# Patient Record
Sex: Male | Born: 1935 | Race: White | Hispanic: No | Marital: Married
Health system: Midwestern US, Community
[De-identification: ages and names within clinical notes are randomized; demographics above are authoritative.]

## PROBLEM LIST (undated history)

## (undated) ENCOUNTER — Emergency Department (HOSPITAL_COMMUNITY): Payer: Medicare Other

## (undated) DIAGNOSIS — E785 Hyperlipidemia, unspecified: Secondary | ICD-10-CM

## (undated) DIAGNOSIS — R269 Unspecified abnormalities of gait and mobility: Secondary | ICD-10-CM

## (undated) DIAGNOSIS — M810 Age-related osteoporosis without current pathological fracture: Secondary | ICD-10-CM

## (undated) DIAGNOSIS — I259 Chronic ischemic heart disease, unspecified: Secondary | ICD-10-CM

## (undated) DIAGNOSIS — E538 Deficiency of other specified B group vitamins: Secondary | ICD-10-CM

## (undated) DIAGNOSIS — D649 Anemia, unspecified: Secondary | ICD-10-CM

## (undated) DIAGNOSIS — Z7901 Long term (current) use of anticoagulants: Secondary | ICD-10-CM

## (undated) DIAGNOSIS — N400 Enlarged prostate without lower urinary tract symptoms: Secondary | ICD-10-CM

## (undated) DIAGNOSIS — W19XXXA Unspecified fall, initial encounter: Secondary | ICD-10-CM

## (undated) DIAGNOSIS — R001 Bradycardia, unspecified: Secondary | ICD-10-CM

## (undated) DIAGNOSIS — E559 Vitamin D deficiency, unspecified: Secondary | ICD-10-CM

## (undated) DIAGNOSIS — I519 Heart disease, unspecified: Secondary | ICD-10-CM

## (undated) DIAGNOSIS — I10 Essential (primary) hypertension: Secondary | ICD-10-CM

## (undated) DIAGNOSIS — K219 Gastro-esophageal reflux disease without esophagitis: Secondary | ICD-10-CM

## (undated) DIAGNOSIS — R55 Syncope and collapse: Secondary | ICD-10-CM

## (undated) DIAGNOSIS — I509 Heart failure, unspecified: Secondary | ICD-10-CM

## (undated) DIAGNOSIS — I252 Old myocardial infarction: Secondary | ICD-10-CM

## (undated) DIAGNOSIS — Z951 Presence of aortocoronary bypass graft: Secondary | ICD-10-CM

## (undated) DIAGNOSIS — M1990 Unspecified osteoarthritis, unspecified site: Secondary | ICD-10-CM

## (undated) DIAGNOSIS — I4891 Unspecified atrial fibrillation: Secondary | ICD-10-CM

## (undated) DIAGNOSIS — F0390 Unspecified dementia without behavioral disturbance: Secondary | ICD-10-CM

## (undated) DIAGNOSIS — R451 Restlessness and agitation: Secondary | ICD-10-CM

## (undated) HISTORY — DX: Age-related osteoporosis without current pathological fracture: M81.0

## (undated) HISTORY — PX: OTHER SURGICAL HISTORY: SHX169

## (undated) HISTORY — DX: Chronic ischemic heart disease, unspecified: I25.9

## (undated) HISTORY — DX: Anemia, unspecified: D64.9

## (undated) HISTORY — DX: Syncope and collapse: R55

## (undated) HISTORY — DX: Long term (current) use of anticoagulants: Z79.01

## (undated) HISTORY — DX: Unspecified abnormalities of gait and mobility: R26.9

## (undated) HISTORY — DX: Unspecified osteoarthritis, unspecified site: M19.90

## (undated) HISTORY — DX: Essential (primary) hypertension: I10

## (undated) HISTORY — DX: Heart failure, unspecified: I50.9

## (undated) HISTORY — DX: Unspecified fall, initial encounter: W19.XXXA

## (undated) HISTORY — DX: Unspecified dementia, unspecified severity, without behavioral disturbance, psychotic disturbance, mood disturbance, and anxiety: F03.90

## (undated) HISTORY — DX: Unspecified atrial fibrillation: I48.91

## (undated) HISTORY — DX: Deficiency of other specified B group vitamins: E53.8

## (undated) HISTORY — DX: Heart disease, unspecified: I51.9

## (undated) HISTORY — DX: Vitamin D deficiency, unspecified: E55.9

## (undated) HISTORY — DX: Bradycardia, unspecified: R00.1

## (undated) HISTORY — DX: Gastro-esophageal reflux disease without esophagitis: K21.9

## (undated) HISTORY — DX: Presence of aortocoronary bypass graft: Z95.1

## (undated) HISTORY — DX: Hyperlipidemia, unspecified: E78.5

## (undated) HISTORY — DX: Benign prostatic hyperplasia without lower urinary tract symptoms: N40.0

## (undated) HISTORY — PX: KNEE ARTHROSCOPY: SHX127

## (undated) HISTORY — DX: Old myocardial infarction: I25.2

## (undated) HISTORY — PX: ESOPHAGOGASTRODUODENOSCOPY ENDOSCOPY: SHX5814

---

## 1999-01-10 HISTORY — PX: EYE SURGERY: SHX253

## 2000-01-10 DIAGNOSIS — Z951 Presence of aortocoronary bypass graft: Secondary | ICD-10-CM

## 2000-01-10 HISTORY — DX: Presence of aortocoronary bypass graft: Z95.1

## 2000-07-09 DIAGNOSIS — I259 Chronic ischemic heart disease, unspecified: Secondary | ICD-10-CM

## 2000-07-09 HISTORY — PX: CORONARY ANGIOPLASTY: SHX604

## 2000-07-09 HISTORY — DX: Chronic ischemic heart disease, unspecified: I25.9

## 2000-08-05 ENCOUNTER — Encounter: Payer: Self-pay | Admitting: Cardiology

## 2000-08-05 ENCOUNTER — Inpatient Hospital Stay (HOSPITAL_COMMUNITY): Admission: EM | Admit: 2000-08-05 | Discharge: 2000-08-15 | Payer: Self-pay | Admitting: *Deleted

## 2000-08-07 ENCOUNTER — Encounter: Payer: Self-pay | Admitting: Cardiology

## 2000-08-09 ENCOUNTER — Encounter: Payer: Self-pay | Admitting: Surgery

## 2000-08-09 HISTORY — PX: CORONARY ARTERY BYPASS GRAFT: SHX141

## 2000-08-10 ENCOUNTER — Encounter: Payer: Self-pay | Admitting: Surgery

## 2000-08-11 ENCOUNTER — Encounter: Payer: Self-pay | Admitting: Surgery

## 2000-08-12 ENCOUNTER — Encounter: Payer: Self-pay | Admitting: Surgery

## 2000-08-13 ENCOUNTER — Encounter: Payer: Self-pay | Admitting: Surgery

## 2000-08-14 ENCOUNTER — Encounter: Payer: Self-pay | Admitting: Surgery

## 2000-08-20 ENCOUNTER — Emergency Department (HOSPITAL_COMMUNITY): Admission: EM | Admit: 2000-08-20 | Discharge: 2000-08-20 | Payer: Self-pay

## 2000-08-24 ENCOUNTER — Encounter: Payer: Self-pay | Admitting: Internal Medicine

## 2000-08-24 ENCOUNTER — Inpatient Hospital Stay (HOSPITAL_COMMUNITY): Admission: AD | Admit: 2000-08-24 | Discharge: 2000-08-29 | Payer: Self-pay | Admitting: Internal Medicine

## 2000-08-25 ENCOUNTER — Encounter: Payer: Self-pay | Admitting: Internal Medicine

## 2000-10-09 ENCOUNTER — Encounter (HOSPITAL_COMMUNITY): Admission: RE | Admit: 2000-10-09 | Discharge: 2001-01-07 | Payer: Self-pay | Admitting: Cardiology

## 2001-01-14 ENCOUNTER — Encounter (HOSPITAL_COMMUNITY): Admission: RE | Admit: 2001-01-14 | Discharge: 2001-04-14 | Payer: Self-pay | Admitting: Cardiology

## 2001-07-04 ENCOUNTER — Encounter: Payer: Self-pay | Admitting: Urology

## 2001-07-04 ENCOUNTER — Encounter: Admission: RE | Admit: 2001-07-04 | Discharge: 2001-07-04 | Payer: Self-pay | Admitting: Urology

## 2002-01-09 HISTORY — PX: COLON SURGERY: SHX602

## 2002-10-14 ENCOUNTER — Encounter (HOSPITAL_COMMUNITY): Admission: RE | Admit: 2002-10-14 | Discharge: 2003-01-12 | Payer: Self-pay | Admitting: Cardiology

## 2003-10-29 HISTORY — PX: COLON SURGERY: SHX602

## 2003-11-06 ENCOUNTER — Ambulatory Visit (HOSPITAL_COMMUNITY): Admission: RE | Admit: 2003-11-06 | Discharge: 2003-11-06 | Payer: Self-pay | Admitting: Gastroenterology

## 2004-01-10 HISTORY — PX: EYE SURGERY: SHX253

## 2004-12-05 ENCOUNTER — Encounter: Admission: RE | Admit: 2004-12-05 | Discharge: 2004-12-05 | Payer: Self-pay | Admitting: Orthopedic Surgery

## 2005-04-18 ENCOUNTER — Inpatient Hospital Stay (HOSPITAL_COMMUNITY): Admission: RE | Admit: 2005-04-18 | Discharge: 2005-04-22 | Payer: Self-pay | Admitting: Orthopedic Surgery

## 2005-04-18 HISTORY — PX: OTHER SURGICAL HISTORY: SHX169

## 2006-01-29 ENCOUNTER — Encounter: Admission: RE | Admit: 2006-01-29 | Discharge: 2006-01-29 | Payer: Self-pay | Admitting: Cardiology

## 2008-09-11 ENCOUNTER — Inpatient Hospital Stay (HOSPITAL_COMMUNITY): Admission: EM | Admit: 2008-09-11 | Discharge: 2008-09-14 | Payer: Self-pay | Admitting: Emergency Medicine

## 2008-09-11 DIAGNOSIS — R55 Syncope and collapse: Secondary | ICD-10-CM

## 2008-09-11 HISTORY — DX: Syncope and collapse: R55

## 2008-09-14 ENCOUNTER — Encounter: Payer: Self-pay | Admitting: Cardiology

## 2009-10-04 ENCOUNTER — Ambulatory Visit: Payer: Self-pay | Admitting: Cardiology

## 2009-12-18 ENCOUNTER — Emergency Department (HOSPITAL_COMMUNITY)
Admission: EM | Admit: 2009-12-18 | Discharge: 2009-12-18 | Payer: Self-pay | Source: Home / Self Care | Admitting: Emergency Medicine

## 2009-12-20 ENCOUNTER — Ambulatory Visit: Payer: Self-pay | Admitting: Cardiology

## 2010-01-20 ENCOUNTER — Ambulatory Visit: Payer: Self-pay | Admitting: Cardiology

## 2010-03-21 LAB — DIFFERENTIAL
Basophils Absolute: 0 10*3/uL (ref 0.0–0.1)
Eosinophils Absolute: 0.2 10*3/uL (ref 0.0–0.7)
Eosinophils Relative: 3 % (ref 0–5)
Lymphocytes Relative: 27 % (ref 12–46)
Monocytes Absolute: 0.6 10*3/uL (ref 0.1–1.0)
Neutro Abs: 3.6 10*3/uL (ref 1.7–7.7)
Neutrophils Relative %: 60 % (ref 43–77)

## 2010-03-21 LAB — CBC
HCT: 38.5 % — ABNORMAL LOW (ref 39.0–52.0)
MCH: 32.2 pg (ref 26.0–34.0)
RBC: 4.16 MIL/uL — ABNORMAL LOW (ref 4.22–5.81)
RDW: 12.5 % (ref 11.5–15.5)
WBC: 6 10*3/uL (ref 4.0–10.5)

## 2010-03-21 LAB — POCT CARDIAC MARKERS
CKMB, poc: 3.3 ng/mL (ref 1.0–8.0)
Troponin i, poc: <0.05 ng/mL (ref 0.00–0.09)

## 2010-03-21 LAB — BASIC METABOLIC PANEL
BUN: 18 mg/dL (ref 6–23)
Calcium: 9.5 mg/dL (ref 8.4–10.5)
Chloride: 103 mEq/L (ref 96–112)
Creatinine, Ser: 1.01 mg/dL (ref 0.4–1.5)
GFR calc Af Amer: >60 mL/min (ref >60)
Potassium: 4 mEq/L (ref 3.5–5.1)
Sodium: 138 mEq/L (ref 135–145)

## 2010-03-21 LAB — APTT: aPTT: 40 seconds — ABNORMAL HIGH (ref 24–37)

## 2010-03-25 ENCOUNTER — Encounter: Payer: Self-pay | Admitting: Internal Medicine

## 2010-04-15 LAB — CBC
MCV: 95.9 fL (ref 78.0–100.0)
Platelets: 238 10*3/uL (ref 150–400)
RDW: 13.3 % (ref 11.5–15.5)
WBC: 6.3 10*3/uL (ref 4.0–10.5)

## 2010-04-15 LAB — CK TOTAL AND CKMB (NOT AT ARMC): Relative Index: 2.7 — ABNORMAL HIGH (ref 0.0–2.5)

## 2010-04-15 LAB — GLUCOSE, CAPILLARY: Glucose-Capillary: 124 mg/dL — ABNORMAL HIGH (ref 70–99)

## 2010-04-15 LAB — CARDIAC PANEL(CRET KIN+CKTOT+MB+TROPI)
CK, MB: 6.9 ng/mL — ABNORMAL HIGH (ref 0.3–4.0)
CK, MB: 7.5 ng/mL — ABNORMAL HIGH (ref 0.3–4.0)
Relative Index: 2.8 — ABNORMAL HIGH (ref 0.0–2.5)
Relative Index: INVALID (ref 0.0–2.5)
Total CK: 13 U/L (ref 7–232)
Total CK: 238 U/L — ABNORMAL HIGH (ref 7–232)
Troponin I: 0.01 ng/mL (ref 0.00–0.06)
Troponin I: 0.02 ng/mL (ref 0.00–0.06)
Troponin I: 0.03 ng/mL (ref 0.00–0.06)

## 2010-04-15 LAB — URINALYSIS, ROUTINE W REFLEX MICROSCOPIC
Ketones, ur: NEGATIVE mg/dL
Nitrite: NEGATIVE
Protein, ur: NEGATIVE mg/dL
Specific Gravity, Urine: 1.021 (ref 1.005–1.030)
Urobilinogen, UA: 0.2 mg/dL (ref 0.0–1.0)
pH: 6 (ref 5.0–8.0)

## 2010-04-15 LAB — BASIC METABOLIC PANEL
Calcium: 9.1 mg/dL (ref 8.4–10.5)
Chloride: 105 mEq/L (ref 96–112)
Creatinine, Ser: 1.05 mg/dL (ref 0.4–1.5)
GFR calc non Af Amer: >60 mL/min (ref >60)
Glucose, Bld: 123 mg/dL — ABNORMAL HIGH (ref 70–99)

## 2010-04-15 LAB — POCT CARDIAC MARKERS: CKMB, poc: 6.5 ng/mL (ref 1.0–8.0)

## 2010-04-15 LAB — DIFFERENTIAL
Lymphocytes Relative: 31 % (ref 12–46)
Lymphs Abs: 1.9 10*3/uL (ref 0.7–4.0)
Neutro Abs: 3.3 10*3/uL (ref 1.7–7.7)

## 2010-04-15 LAB — TROPONIN I: Troponin I: 0.02 ng/mL (ref 0.00–0.06)

## 2010-04-15 LAB — PROTIME-INR: Prothrombin Time: 24.3 seconds — ABNORMAL HIGH (ref 11.6–15.2)

## 2010-05-04 ENCOUNTER — Other Ambulatory Visit: Payer: Self-pay | Admitting: *Deleted

## 2010-05-04 DIAGNOSIS — E7800 Pure hypercholesterolemia, unspecified: Secondary | ICD-10-CM

## 2010-05-04 DIAGNOSIS — Z79899 Other long term (current) drug therapy: Secondary | ICD-10-CM

## 2010-05-09 ENCOUNTER — Encounter: Payer: Self-pay | Admitting: Cardiology

## 2010-05-09 DIAGNOSIS — I252 Old myocardial infarction: Secondary | ICD-10-CM | POA: Insufficient documentation

## 2010-05-09 DIAGNOSIS — I480 Paroxysmal atrial fibrillation: Secondary | ICD-10-CM | POA: Insufficient documentation

## 2010-05-09 DIAGNOSIS — I519 Heart disease, unspecified: Secondary | ICD-10-CM | POA: Insufficient documentation

## 2010-05-09 DIAGNOSIS — I259 Chronic ischemic heart disease, unspecified: Secondary | ICD-10-CM | POA: Insufficient documentation

## 2010-05-09 DIAGNOSIS — I498 Other specified cardiac arrhythmias: Secondary | ICD-10-CM | POA: Insufficient documentation

## 2010-05-09 DIAGNOSIS — R001 Bradycardia, unspecified: Secondary | ICD-10-CM | POA: Insufficient documentation

## 2010-05-09 DIAGNOSIS — R002 Palpitations: Secondary | ICD-10-CM | POA: Insufficient documentation

## 2010-05-16 ENCOUNTER — Encounter: Payer: Self-pay | Admitting: Cardiology

## 2010-05-16 ENCOUNTER — Other Ambulatory Visit: Payer: Self-pay | Admitting: *Deleted

## 2010-05-16 ENCOUNTER — Ambulatory Visit (INDEPENDENT_AMBULATORY_CARE_PROVIDER_SITE_OTHER): Payer: 59 | Admitting: Cardiology

## 2010-05-16 DIAGNOSIS — I259 Chronic ischemic heart disease, unspecified: Secondary | ICD-10-CM

## 2010-05-16 DIAGNOSIS — I4891 Unspecified atrial fibrillation: Secondary | ICD-10-CM

## 2010-05-16 DIAGNOSIS — I480 Paroxysmal atrial fibrillation: Secondary | ICD-10-CM

## 2010-05-16 NOTE — Assessment & Plan Note (Signed)
We'll continue current medicines. He's not had any recurrent chest pain.

## 2010-05-16 NOTE — Assessment & Plan Note (Signed)
He is in atrial fibrillation today. He's on chronic warfarin and that is being regulated by the Coumadin clinic. His EKG today confirms his atrial fibrillation. He's had lab work done by primary care recently and has normal renal function normal electrolytes. His INR on May 1 was 2.9. In general, he's doing reasonably well.  I will have him see Lawson Fiscal and Dr. [place] on return

## 2010-05-16 NOTE — Progress Notes (Signed)
Subjective:   Troy Freeman is seen today for followup visit overall, he's doing well from a cardiac standpoint the complaining of bad knees, and a bad back, and a bad stomach. He has decreased hearing and mild dementia. He has a history of anterior microinfarction in July 2002 treated with angioplasty the LAD. This does correlate led to bypass grafting x6 in August of 2002. He a LIMA to the LAD, sequential saphenous vein graft the distal right coronary artery posterior descending, sequential saphenous vein graft to OM1 and 02, and vein graft to the diagonal. He has known LV dysfunction. His last stress study was in 2007 which showed an ejection fraction of 45%. He does have anterior scar and akinesis. He had no ischemia at that time he has a history of paroxysmal atrial fibrillation as been on chronic Coumadin. His other problems include right hip surgery 1996, left hip surgery 2007, BPH, hyperlipemia, gastroesophageal reflux disease, and dementia.  Current Outpatient Prescriptions  Medication Sig Dispense Refill  . Acetaminophen (TYLENOL 8 HOUR PO) Take 1,000 mg by mouth 2 (two) times daily.       . Calcium Carbonate-Vitamin D (CALCIUM + D PO) Take 600 mg by mouth daily. 3 TABS DAILY       . metoprolol succinate (TOPROL-XL) 25 MG 24 hr tablet Take 25 mg by mouth daily.        Marland Kitchen omeprazole (PRILOSEC) 20 MG capsule Take 20 mg by mouth daily.        . simvastatin (ZOCOR) 40 MG tablet Take 40 mg by mouth at bedtime.        Marland Kitchen warfarin (COUMADIN) 1 MG tablet Take 1 mg by mouth as directed.          Allergies  Allergen Reactions  . Codeine   . Penicillins     Patient Active Problem List  Diagnoses  . Palpitations  . Fluttering heart  . Bradycardia  . IHD (ischemic heart disease)  . LV dysfunction  . MI, old  . PAF (paroxysmal atrial fibrillation)    History  Smoking status  . Former Smoker  . Quit date: 01/09/1958  Smokeless tobacco  . Not on file    History  Alcohol Use No    Family  History  Problem Relation Age of Onset  . Cancer Mother   . Heart disease Father     Review of Systems:   The patient denies any heat or cold intolerance.  No weight gain or weight loss.  The patient denies headaches or blurry vision.  There is no cough or sputum production.  The patient denies dizziness.  There is no hematuria or hematochezia.  The patient denies any muscle aches or arthritis.  The patient denies any rash.  The patient denies frequent falling or instability.  There is no history of depression or anxiety.  All other systems were reviewed and are negative.   Physical Exam:   Weight is 200. Blood pressure is 112 of 70 sitting, heart rate 56 and irregular. Suspect EKG confirmed atrial fibrillation with controlled ventricular response with nonspecific ST-T wave changes compatible with the old anterior septal myocardial infarction.The head is normocephalic and atraumatic.  Pupils are equally round and reactive to light.  Sclerae nonicteric.  Conjunctiva is clear.  Oropharynx is unremarkable.  There's adequate oral airway.  Neck is supple there are no masses.  Thyroid is not enlarged.  There is no lymphadenopathy.  Lungs are clear.  Chest is symmetric.  Heart shows an irregular  rate and rhythm.  S1 and S2 are normal.  There is no murmur click or gallop.  Abdomen is soft normal bowel sounds.  There is no organomegaly.  Genital and rectal deferred.  Extremities are without edema.  Peripheral pulses are adequate.  Neurologically intact.  Full range of motion.  The patient is not depressed.  Skin is warm and dry.  Assessment / Plan:

## 2010-05-27 NOTE — H&P (Signed)
NAME:  Troy Freeman, Troy Freeman           ACCOUNT NO.:  [id number]   MEDICAL RECORD NO.:  [id number]          PATIENT TYPE:   LOCATION:                                 FACILITY:   PHYSICIAN:  Mishon Blubaugh. Charlann Boxer, M.D.  DATE OF BIRTH:  12-07-1920   DATE OF ADMISSION:  04/18/2005  DATE OF DISCHARGE:                                HISTORY & PHYSICAL   CHIEF COMPLAINT:  Pain in my left hip.   HISTORY OF PRESENT ILLNESS:  This 77 year old white male seen by [place] for  continuing problems concerning pain into his left hip.  Some 10 years ago he  underwent a right total hip replacement arthroplasty and has done very well  since then.  Unfortunately he has continued with increasing problems into  his hip where he has to use a cane for ambulation now.  He has done really  well over the years but now has his day to day activities markedly  interfered with concerning his right hip.  X-rays have shown advanced  degenerative changes to the left hip consistent with painful range of  motion, internal/external rotation on physical examination.  After much  discussion including the risks and benefits of surgery and the fact that  this gentleman wants to remain as active as possible, it is decided he would  benefit from surgical intervention and is being admitted for a total hip  replacement arthroplasty to the left hip.  He is to be seen by Dr. Roger Shelter preoperatively for heart studies to clear him for surgery.   PRIMARY CARE PHYSICIANS:  Medical doctor is Dr. Manfred Shirts.  Her office is in [place] on [address].  .  Cardiologist is Dr. Roger Shelter.   PAST MEDICAL HISTORY:  He has had a heart attack in the past.  He has reflux  currently.   ALLERGIES:  PENICILLIN and CODEINE.   CURRENT MEDICATIONS:  1.  Prilosec 20 mg one daily.  2.  Toprol 25 mg daily.  3.  Digoxin 125 mg one daily.  4.  Coumadin 5 mg one daily.  5.  Zetia 10 mg one daily.  6.  Acetaminophen for pain.  7.   He will stop his Coumadin 5 days prior to the surgery.  I have told him      to discuss this with Dr. Deborah Chalk to see if he can stop it earlier.   PAST SURGICAL HISTORY:  The patient had right total hip replacement  arthroplasty by Dr. Fannie Knee in the past.  Coronary artery bypass grafts in 2002.   FAMILY HISTORY:  Positive for heart disease in his father.  His mother with  some sort of cancer.  Father had a stroke.  Mother had arthritis.   SOCIAL HISTORY:  The patient is married, retired.  No intake of alcohol,  tobacco products.  His wife Yehuda Mao will be his major caregiver after surgery.   REVIEW OF SYSTEMS:  CNS:  No seizure disorder, paralysis, numbness, double  vision.  RESPIRATORY:  No productive cough, no hemoptysis, no shortness of  breath.  CARDIOVASCULAR:  No chest pain.  No  angina, no orthopnea.  GASTROINTESTINAL:  No nausea, vomiting, melena or bloody stools. He does  have some mild diarrhea at the present time. GENITOURINARY:  No discharge,  dysuria or hematuria.  He denies any urinary problems.  MUSCULOSKELETAL:  Primarily in present illness   PHYSICAL EXAMINATION:  GENERAL APPEARANCE:  Alert, cooperative and friendly,  conversational 77 year old white male who is well dressed and walking with a  cane.  VITAL SIGNS:  Blood pressure 132/70, pulse 68, respirations 12.  HEENT:  Normocephalic. Pupils equal, round, reactive to light and  accommodation..  Extraocular movements intact. Oropharynx is clear.  CHEST:  Clear to auscultation. No rhonchi, rales or wheezes.  HEART:  Regular rate and rhythm without no murmurs heard.  ABDOMEN:  Soft, nontender.  Liver and spleen not felt.  GENITALIA/RECTAL:  Not done, not pertinent to present illness.  EXTREMITIES:  Left hip as in present illness above.   ADMISSION DIAGNOSES:  1.  Osteoarthritis left hip.  2.  Coronary artery disease.  3.  Gastroesophageal reflux disease.   PLAN:  The patient will undergo left total hip replacement  arthroplasty  after cardiac clearance from Dr. Deborah Chalk.  He may need to have a short stay  in a skilled nursing facility after the surgery.  We will certainly see how  he does in the hospital.  Should we have any medical problems, we will  certainly call his medical doctor, if any cardiac problems will call Dr.  Deborah Chalk.      [name].      Madlyn Frankel Charlann Boxer, M.D.  Electronically Signed    DLU/MEDQ  D:  03/30/2005  T:  03/31/2005  Job:  [id number]   cc:   Colleen Can. Deborah Chalk, M.D.  Fax: [phone]

## 2010-05-27 NOTE — Discharge Summary (Signed)
NAME:  Troy Freeman, Troy Freeman           ACCOUNT NO.:  [id number]   MEDICAL RECORD NO.:  [id number]          PATIENT TYPE:  INP   LOCATION:  1519                         FACILITY:  [facility]   PHYSICIAN:  Madlyn Frankel. Charlann Boxer, M.D.  DATE OF BIRTH:  [birth year]   DATE OF ADMISSION:  04/18/2005  DATE OF DISCHARGE:  04/22/2005                                 DISCHARGE SUMMARY   ADMITTING DIAGNOSES:  1.  Osteoarthritis of left hip.  2.  Coronary artery disease.  3.  Gastroesophageal reflux disease.   DISCHARGE DIAGNOSES:  1.  Osteoarthritis of left hip.  2.  Coronary artery disease.  3.  Gastroesophageal reflux disease.  4.  Mild postoperative anemia.   OPERATION:  On April 18, 2005, the patient underwent left total hip  replacement arthroplasty utilizing DePuy hip system, Dr. Ranee Gosselin  assisted.   BRIEF HISTORY:  This 77 year old gentleman with problems concerning his left  hip.  He has had increasing pain and discomfort, difficulty getting about  and overall his level of activity has decreased.  After reviewing the risks  and benefits of surgery, the patient has decided to go ahead with total hip  replacement arthroplasty.  X-rays have shown severe osteoarthritis to the  hip.  He is highly desirous.  He had a very successful right total hip  replacement arthroplasty in the not too distant past and he desires to have  the left done so that he could resume a relatively normal lifestyle.  He is  a very active gentleman.   COURSE IN THE HOSPITAL:  The patient tolerated the surgical procedure quite  well.  He is very anxious to begin with his total hip protocol.  He worked  diligently with physical therapy.  We allowed 50% weight bearing and he was  able to maintain this.  He was placed on Coumadin protocol postoperatively,  for the prevention of DVT, and tolerated this quite well.  On the day of  discharge he was awake and alert, using only mild analgesics for discomfort.  He was stable and  he was discharged home.   LABORATORY VALUES IN THE HOSPITAL:  Hematologically, showed a preoperative  CBC with a mild anemia, the RBC was 3.98, hematocrit was 38.4.  Final  hemoglobin was 9.9 with hematocrit of 28.3.  Blood chemistries were normal.  Urinalysis was negative for urinary tract infection.  Chest x-ray showed  emphysema without acute cardiopulmonary process.  No electrocardiogram seen  on this chart.   CONDITION ON DISCHARGE:  Improved, stable.   PLAN:  The patient is discharged to his home in the care of his family, to  continue 50% weight bearing on his operative extremity.  He has home health  with [facility].  Use dry dressing p.r.n.  May shower 5 days after  date of surgery.   DISCHARGE MEDICATIONS:  To continue with home medications and diet and also  to use Darvocet-N 100 for discomfort, Coumadin per pharmacy, Robaxin as a  muscle relaxant, Trinsicon one b.i.d. to keep his blood built up.   He will return to see his medical doctor at time  of discharge.      [name].      Madlyn Frankel Charlann Boxer, M.D.  Electronically Signed    DLU/MEDQ  D:  04/27/2005  T:  04/28/2005  Job:  [id number]   cc:   Colleen Can. Deborah Chalk, M.D.  Fax: [phone]   Renato Gails, Dr.

## 2010-05-27 NOTE — Op Note (Signed)
[place]. [facility]  Patient:    Troy Freeman, Troy Freeman                  MRN: [id number] Proc. Date: 08/09/00 Adm. Date:  [id number] Attending:  Eleanora Neighbor CC:         Colleen Can. Deborah Chalk, M.D.  [facility]   Operative Report  PREOPERATIVE DIAGNOSIS:  Severe three-vessel coronary artery disease, status post acute anterior septal myocardial infarction secondary to left anterior descending coronary artery occlusion.  POSTOPERATIVE DIAGNOSIS:  Severe three-vessel coronary artery disease, status post acute anterior septal myocardial infarction secondary to left anterior descending coronary artery occlusion.  PROCEDURES:  Median sternotomy, extracorporeal circulation, coronary artery bypass graft surgery x 6 using a left internal mammary artery graft to the left anterior descending coronary artery, with a saphenous vein graft to the first diagonal branch of the left anterior descending, a sequential saphenous vein graft to the first and second obtuse marginal branches of the left circumflex coronary artery, and a sequential saphenous vein graft to the distal right coronary artery and the posterior descending branch of the right coronary artery.  SURGEON:  Alleen Borne, M.D.  ASSISTANT:  Adair Patter, P.A.  ANESTHESIA:  General endotracheal.  CLINICAL HISTORY:  This patient is a 77 year old gentleman who was admitted with a large acute anterior septal myocardial infarction secondary to LAD occlusion.  Cardiac catheterization also showed 90% ostial right coronary artery stenosis as well as 80-90% left circumflex stenosis.  There was a small first diagonal branch that had about 90% stenosis.  The LAD was opened with angioplasty.  The patient remained hemodynamically stable and was transported to the coronary care unit.  He has gradually recovered from this myocardial infarction.  Left ventricular ejection fraction at  catheterization was about 25-30%.  DESCRIPTION OF PROCEDURE:  The patient was taken to the operating room and placed on the table in supine position.  After induction of general endotracheal anesthesia, a Foley catheter was placed in the bladder using sterile technique.  Then the chest, abdomen, and both lower extremities were prepped and draped in the usual sterile manner.  The chest was entered through a median sternotomy incision.  The pericardium was opened in the midline. Examination of the heart showed akinesis of the anterior wall with diffuse hemorrhage and ecchymosis in the anterior wall.  The ascending aorta had no palpable plaques in it.  Then the left internal mammary artery was harvested from the chest wall as a pedicle graft.  This was a medium-caliber vessel with excellent blood flow through it.  At the same time, a segment of greater saphenous vein was harvested from the right leg, and this vein was of medium size and good quality.  Then the patient was heparinized and when an adequate activated clotting time was achieved, the distal ascending aorta was cannulated using a 20 French aortic cannula for arterial inflow.  Venous outflow was achieved using a two-stage venous cannula through the right atrial appendage.  An antegrade cardioplegia and vent cannula was inserted in the aortic root.  The patient was placed on cardiopulmonary bypass and the distal coronaries identified.  The LAD was a large, graftable vessel.  The diagonal branch was small but graftable.  The two obtuse marginal branches were both large, graftable vessels.  The right coronary artery was diffusely diseased extending out to the takeoff of the posterior descending branch.  There was an area soft enough to graft just  before the takeoff of the posterior descending branch. There was a small posterolateral system that was not graftable, and therefore I decided to graft the distal right coronary artery to  continue supplying this area.  The patient essentially had dual posterior descending arteries, one of which was larger than the other.  One of them was lying beneath a large epicardial vein and was not graftable, but the other one was.  Then the aorta was crossclamped and 500 cc of cold blood antegrade cardioplegia was administered in the aortic root with quick arrest of the heart.  Systemic hypothermia at 20 degrees Centigrade and topical hypothermia with iced saline was used.  A temperature probe was placed in the septum and an insulating pad in the pericardium.  The first distal anastomosis was performed to the first marginal branch.  The internal diameter was 1.75 mm.  The conduit used was a segment of greater saphenous vein and the anastomosis performed in a sequential side-to-side manner using continuous 7-0 Prolene suture.  Flow was measured through the graft and was excellent.  The second distal anastomosis was performed to the second marginal branch. The internal diameter was 1.75 mm.  The conduit used was the same segment of greater saphenous vein and the anastomosis performed in a sequential end-to-side manner using continuous 7-0 Prolene suture.  Flow was measured through the graft and was excellent.  Then a dose of cardioplegia was given through the vein grafts and in the aortic root.  The third distal anastomosis was performed to the diagonal branch.  The internal diameter was 1.5 mm.  The conduit used was the second segment of greater saphenous vein and the anastomosis performed in an end-to-side manner using continuous 7-0 Prolene suture.  Flow was measured through the graft and was excellent.  Then the fourth distal anastomosis was performed to the distal right coronary artery.  The internal diameter was about 2.5 mm.  The conduit used was a third segment of greater saphenous vein and the anastomosis performed in a sequential side-to-side manner using continuous 7-0  Prolene suture.  Flow was  measured through the graft and was excellent.  The fifth distal anastomosis was performed to the posterior descending coronary artery.  The internal diameter was 1.75 mm.  The conduit used was the same segment of greater saphenous vein and the anastomosis performed in a sequential end-to-side manner using continuous 7-0 Prolene suture.  Flow was measured through the graft and was excellent.  Then the sixth distal anastomosis was performed to the midportion of the left anterior descending coronary artery.  The internal diameter was 2 mm.  The conduit used was the left internal mammary graft, and this was brought through an opening in the left pericardium anterior to the phrenic nerve.  It was anastomosed to the LAD in end-to-side manner using continuous 8-0 Prolene suture.  The pedicle was tacked to the epicardium with 6-0 Prolene sutures. The patient was rewarmed to 37 degrees Centigrade and the clamp removed from the mammary pedicle.  There was rapid warming of the ventricular septum and return of spontaneous ventricular fibrillation.  The crossclamp was removed with a time of 90 minutes and the patient defibrillated into sinus rhythm.  A partial occlusion clamp was placed on the aortic root, and the three proximal vein graft anastomoses were performed in end-to-side manner using continuous 6-0 Prolene suture.  The clamp was removed, the vein grafts de-aired, and the clamps removed from them.  The proximal and distal anastomoses appeared  hemostatic and the alignment of the grafts satisfactory. Graft markers were placed around the proximal anastomoses.  Two temporary right ventricular and right atrial pacing wires were placed and brought out through the skin.  When the patient had rewarmed to 37 degrees Centigrade, he was weaned from cardiopulmonary bypass on low-dose dopamine.  Total bypass time was 147 minutes.  Cardiac function appeared good with cardiac  output of 6 L/min. Protamine was given, and the venous and aortic cannulas were removed without difficulty.  Hemostasis was achieved.  Three chest tubes were placed with two in the posterior pericardium, one in the left pleural space, and one in the anterior mediastinum.  The pericardium was reapproximated over the heart.  The sternum was closed with #6 stainless steel wires.  The fascia was closed with continuous #1 Vicryl suture.  Subcutaneous tissue was closed with continuous 2-0 Vicryl and the skin with 3-0 Vicryl subcuticular closure.  The lower extremity vein harvest site was closed in layers in a similar manner.  The sponge, needle, and instrument counts were correct according to the scrub nurse.  Dry sterile dressings were applied over the incisions and around the chest tubes, which were hooked to Pleuravac suction.  The patient remained hemodynamically stable and was transported to the SICU in guarded but stable condition. DD:  08/09/00 TD:  08/10/00 Job: [id number]

## 2010-05-27 NOTE — H&P (Signed)
[place]. [facility]  Patient:    Troy Freeman, Troy Freeman                    MRN: [id number] Adm. Date:  08/05/00 Attending:  Colleen Can. Deborah Chalk, M.D. CC:         Rodrigo Ran, M.D.   History and Physical  HISTORY OF PRESENT ILLNESS:  The patient is admitted for an acute anterior myocardial infarction.  He was seen after church and noted to have onset of vomiting, chest pressure and tightness, and pallor, brought to the emergency room with ECG initially showing peak T waves anteriorly and then ST elevation. He has a history of hypercholesterolemia, had been started on Welchol about 6-8 weeks ago.  He subsequently developed diarrhea which has been a real problem for him.  He has been recently treated with Flagyl and Cipro.  He had a stress test in March of 2002, was felt to probably be a stress Cardiolite study, which was negative for ischemia.  ALLERGIES:  PENICILLIN.  CURRENT MEDICATIONS:  Recently Cipro and Flagyl.  PAST SURGICAL HISTORY:  He has had a hip replacement five years ago and knee arthroscopy approximately three years ago.  REVIEW OF SYSTEMS:  He has had a history of hiatal hernia and has had apparently esophageal dilatation.  He has had occasional palpitations over the last 3-4 years.  PHYSICAL EXAMINATION:  GENERAL:  On examination, he is an elderly, 77 year old male, who is vomiting. He is pale.  He is well-developed, well-nourished.  HEENT:  Negative.  NECK:  Supple without bruits.  LUNGS:  Basically clear.  HEART:  Shows soft heart sounds.  No murmur.  ABDOMEN:  Nontender.  EXTREMITIES:  Without edema.  Vomitus was green and bile colored.  His ECG showed changes compatible with acute anterior myocardial infarction.  OVERALL IMPRESSION:  Acute anterior myocardial infarction.  PLAN:  To take him emergently to the cardiac catheterization lab.  His potassium levels are low and we will try to replace potassium as we  proceed. DD:  08/05/00 TD:  08/06/00 Job: [id number]

## 2010-05-27 NOTE — Op Note (Signed)
NAME:  Troy Freeman, Troy Freeman           ACCOUNT NO.:  [id number]   MEDICAL RECORD NO.:  [id number]          PATIENT TYPE:  AMB   LOCATION:  ENDO                         FACILITY:  [facility]   PHYSICIAN:  John C. Madilyn Fireman, M.D.    DATE OF BIRTH:  [birth year]   DATE OF PROCEDURE:  11/06/2003  DATE OF DISCHARGE:                                 OPERATIVE REPORT   PROCEDURE:  Colonoscopy.   INDICATIONS FOR PROCEDURE:  Average risk colon cancer screening.   DESCRIPTION OF PROCEDURE:  The patient was placed in the left lateral  decubitus position and placed on the pulse monitor with continuous low flow  oxygen delivered by nasal cannula.  He was sedated with 50 mcg IV Fentanyl  and 4 mg IV Versed.  The Olympus videocolonoscope was inserted into the  rectum and advanced to the cecum, confirmed by transillumination of  McBurney's point and visualization of the ileocecal valve and appendiceal  orifice.  The prep was excellent.  The cecum, ascending, transverse,  descending, and sigmoid colon all appeared normal, with no masses, polyps,  diverticula, or other mucosal abnormalities.  The rectum likewise appeared  normal.  On retroflex view, the anus revealed no obvious internal  hemorrhoids.  The scope was then withdrawn and the patient returned to the  recovery room in stable condition.  He tolerated the procedure well, and  there were no immediate complications.   IMPRESSION:  Normal colonoscopy.   PLAN:  Next colon screening by sigmoidoscopy in five years.      JCH/MEDQ  D:  11/06/2003  T:  11/06/2003  Job:  [id number]   cc:   Arliss Journey, M.D.

## 2010-05-27 NOTE — Op Note (Signed)
NAME:  RUGER, SAXER           ACCOUNT NO.:  [id number]   MEDICAL RECORD NO.:  [id number]          PATIENT TYPE:  INP   LOCATION:  0004                         FACILITY:  [facility]   PHYSICIAN:  Madlyn Frankel. Charlann Boxer, M.D.  DATE OF BIRTH:  Dec 29, 1920   DATE OF PROCEDURE:  04/18/2005  DATE OF DISCHARGE:                                 OPERATIVE REPORT   PREOPERATIVE DIAGNOSIS:  Left hip osteoarthritis.   POSTOPERATIVE DIAGNOSIS:  Left hip osteoarthritis.   PROCEDURE:  Left total hip replacement.   COMPONENTS USED:  DePuy hip system, size 56 Pinnacle cup, a 32 neutral  Marathon liner, two cancellous bone screws, an S/ROM 20 x 15 36+8 stem with  a 13F XXL sleeve with a 32+3 ball.   SURGEON:  Rick Carruthers. Charlann Boxer, M.D.   ASSISTANT:  Georges Lynch. Darrelyn Hillock, M.D.   ANESTHESIA:  General.   BLOOD LOSS:  300.   DRAINS:  One.   COMPLICATIONS:  None.   INDICATIONS FOR PROCEDURE:  Troy Freeman is a very pleasant 77 year old  gentleman who presented for evaluation with left hip.  He has a history of a  right total hip replacement that has done fairly well.  His hip pain has  been progressive and consistent, resulting in decreased quality of life due  to decreased activity.  After reviewing with him the hip surgery risks and  benefits, he opted for hip replacement surgery versus further conservative  options.  He was noted radiographically to have severe arthritis.  Consent  was obtained.   PROCEDURE IN DETAIL:  The patient was brought to the operating theater.  Once adequate anesthesia and preoperative antibiotics, 1 gm of Ancef, were  administered, the patient was positioned in a right lateral decubitus  position with the left side up.  The left lower extremity was then prepped  and draped in a sterile fashion.  A lateral-based incision was made for a  posterior approach to the hip.  The iliotibial band and gluteus maximus  fascia were incised in line with the incision.  Short external rotators  were  taken down separate from the posterior capsule, which was saved for  protection against the sciatic nerve as well as later repair.  Once exposure  was obtained, the hip was dislocated.  The neck osteotomy was made, based  off of anatomic landmarks, based on the tip of the trochanter in the center  of the head in relationship to the contralateral hip and preoperative  templating.   Attention was first directed to the femur, where femoral neck osteophyte  were debrided off the neck with anterior displacement of the femur.  Once  this was carried out, the femur was retracted anteriorly and laterally to  allow for reaming.  Labrectomy was carried out as well as further acetabular  exposure.  The reaming commenced with a 45 reamer and was carried up  sequentially to a 55 reamer, where I had good bony coverage.  I used a  combination of straight and angled reamers to get the right angle and  orientation for cup placement.  The final 56 Pinnacle cup was then  impacted  with 35-40 degrees of abduction and 20 degrees of forward flexion beneath  the anterior rim.  Two cancellous bone screws were placed with initial  scratch-fit purchase.  A final central hole eliminator was placed, followed  by the positioning of a 32 neutral marathon cup.   At this point, attention was directed to the femur.  Following further  debridement around the proximal trochanteric fossa region, I prepared the  femur per protocol for the S/ROM.  As I went down with my 12 mm reamer with  the intentions of an 18 x 13 stem, I only got a little bit of bite, and it  was at the very tip of the bow of the femur.  For this reason, I opted to  ream up to a 20 x 15.  I was able to ream to a 15 with a 15.5 passed 3/4 of  the way down.  I then reamed the proximal femur to a size F and then milled  the neck to an XXL.  A trial sleeve was placed.  Note that all of the bony  preparation was based off the tip of the trochanter, and I  tried to match  this up, compared to the contralateral lower extremity.  The sleeve was  oriented at about 20-25 degrees of anteversion, about 5 degrees more  anteverted than the native neck.  Trial reduction was carried out with a  36+8 stem with initially a +0 ball.  Though flexion of the hip appeared real  lax in extension, there was still a few millimeters of shuck but not an  excessive amount.  The hip stability was fairly good with some evidence of  subluxation at 70 degrees of internal rotation.  With this, I trialed a +3  ball.  Felt that this was much more secure with about 1 mm of shuck in  extension.  The leg lengths appeared appropriate to the down leg.   Given these findings, the trial components were removed.  Final preparation  of the femur was carried out.  The final 20 XXL sleeve was then impacted to  the level of the neck cut, followed by position of the femoral stem,  oriented in a neutral position in relationship to the sleeve, and again,  about 20-25 degrees of anteversion.  This was impacted all the way down  without complications, and the +3 trial was placed.  The patient's combined  anteversion was noted to be about 45-50 degrees.  Hip stability was  excellent, as noted.  For this reason, the final 32+3 ball was impacted onto  a clean and dry trunnion, and the hip reduced.  The hip was irrigated  throughout the case and again at this point, reapproximated the posterior  capsule to the superior capsule.  The medium Hemovac drain was placed deep.  The remainder of the wound was closed in layers with #1 Ethibond on the  iliotibial band, #1 Vicryl on the gluteal fascia.  I used 2-0 Vicryl in the  subcu layer and one 4-0 Monocryl.  The Steri-Strips were applied.  The bulky  sterile dressing applied.  The patient was transferred, extubated, to the  recovery room in stable condition.      Madlyn Frankel Charlann Boxer, M.D.  Electronically Signed    MDO/MEDQ  D:  04/18/2005  T:   04/18/2005  Job:  [id number]

## 2010-05-27 NOTE — Discharge Summary (Signed)
[place]. [facility]  Patient:    Troy Freeman, Troy Freeman Visit Number: [id number] MRN: [id number]          Service Type: MED Location: [phone] Attending Physician:  Rodrigo Ran A Adm. Date:  08/24/2000 Disc. Date: 08/29/00   CC:         John C. Madilyn Fireman, M.D.  Colleen Can. Deborah Chalk, M.D.  Alleen Borne, M.D.   Discharge Summary  CONSULTANT: Gastroenterology, Everardo All. Madilyn Fireman, M.D.  DISCHARGE DIAGNOSES:  1. Chronic diarrheal illness for the last six weeks, with one positive     Clostridium difficile stool study; therefore, Clostridium difficile     colitis suspected.  2. Weight loss secondary to #1 as well as due to recent myocardial infarction     and coronary artery bypass grafting.  3. Atherosclerotic coronary artery disease, status post recent large     anterior myocardial infarction.  4. Status post six-vessel coronary artery bypass grafting August 09, 2000.  5. Chronic anticoagulation secondary to anterior myocardial infarction.  6. History of gastroesophageal reflux disease, currently quiescent.  7. History of benign prostatic hypertrophy, not an active issue currently.  8. Hyperlipidemia, currently on no therapy for this.  9. Anxiety. 10. Anemia, improving, felt secondary to recent thoracic surgery. 11. Poor nutritional status currently. 12. Supratherapeutic international normalized ratio on Coumadin.  PROCEDURES: CT scan of abdomen and pelvis, which was relatively unremarkable. It did show small pleural effusions with possible atelectasis and some simple bilateral renal cysts, as well as some degenerative joint disease of the spine, as well as some artifact from his right hip prosthesis, but no sign of any abscess or acute cause of diarrhea or weight loss.  DISCHARGE MEDICATIONS:  1. Toprol XL 25 mg q.d. as before.  2. Digoxin 0.125 mg q.d. as before.  3. Niferex 150 mg q.d. as before.  4. Paxil CR 12.5 mg one p.o. q.d.  5. Flagyl 500  mg p.o. t.i.d. for two weeks.  This may need to be extended     per the discretion of gastroenterology.  6. Resource supplement t.i.d.  7. Tums 500 mg b.i.d.  8. Coumadin 5 mg 1/2 tablet 2.5 mg each Wednesday and Friday, and to take     5 mg (whole tablet) each Monday, Tuesday, Thursday, Saturday, and Sunday.  9. He is to hold his Lasix 20 mg unless he has noticeable increased swelling     in the feet or any increased shortness of breath, at which point he should     contact his M.D. 10. No current hyperlipidemia therapy, although this will need to be     reconsidered in the future.  HISTORY OF PRESENT ILLNESS: [name] is a 77 year old male with atherosclerotic coronary artery disease, status post recent large anterior MI at the end of July 2002, with subsequent six-vessel coronary artery bypass grafting on August 09, 2000.  The patient had two to three weeks of diarrhea prior to this event.  This seemed better in the hospital; however, upon returning home he had return of diarrhea with significant fatigue. Furthermore, he has had progressive weight loss associated with this.  Due to these concerns he is admitted for further evaluation and care.  HOSPITAL COURSE: [name] was admitted to a telemetry bed.  He did not show any signs of any chest pain during his hospitalization.  His initial troponin I was slightly elevated but this was felt to be due to his recent cardiac event, and  he had totally normal CK enzymes during this admission. [name] did have at least two or three loose stools on most days during this admission.  CT scan of the abdomen and pelvis was performed with fairly unremarkable results, as noted above.  He had multiple fecal occult blood tests which were negative.  He had a prealbumin level which was low at 9.9, and nutrition was consulted with supplemental Resource offered.  He did have one C. difficile stool toxin which returned positive for C.  difficile toxin. Therefore, he was empirically restarted on Flagyl 500 mg t.i.d. to complete at least two weeks of therapy.  Of note, antiagglutinin antibodies have been sent and are pending.  Dr. Madilyn Fireman of gastroenterology was consulted and concurred with the plan; however, the patient will need further follow-up and may need further evaluation if the problem does not resolve.  Mr. Rabine was noted to have a somewhat supratherapeutic INR, with INR increasing to 4.1.  His Coumadin dose was decreased accordingly.  Furthermore, it was felt [name] has been significantly anxious due to the recent events in his life. He denied any significant depression.  However, due to his weight loss and some concern that anxiety was playing a role, he was started on Paxil CR 12.5 mg q.d. empirically, and this will be followed up further as an outpatient.  On August 29, 2000 [name] was discharged home in stable condition.  DISCHARGE PHYSICAL EXAMINATION:  VITAL SIGNS: The patient was afebrile, with stable vital signs.  Blood pressure 110/70.  GENERAL: He had one stool in the previous 24 hours.  He was lying in bed and in no acute distress.  The patient is thin.  NECK: No significant JVD noted.  CHEST: Clear to auscultation bilaterally.  HEART: Regular, with no murmur.  ABDOMEN: Soft, nontender, nondistended.  No mass or hepatosplenomegaly.  EXTREMITIES: There was some trace edema at the site of his vein harvest site; otherwise, no significant edema.  NEUROLOGIC: The patient was ambulating without significant difficulty and had normal strength grossly.  DISCHARGE LABORATORY DATA: Multiple fecal occult blood tests were negative. On August 28, 2000 WBC was 9.0, hemoglobin 10.9, platelet count 442,000. Sodium 135, potassium 4.4, chloride 105, CO2 25, BUN 15, creatinine 1.1,  glucose 93.  Calcium 8.4.  INR 4.1.  He did have one positive C. difficile stool toxin, and also two or  three that were negative.  TSH was checked during this hospitalization and was found to be normal at 2.54.  Prealbumin was low at 9.9.  Antiagglutinin antibody assay is pending at the time of discharge.  DISCHARGE ACTIVITY: Mr. Cabello is to be up as tolerated.  DISCHARGE DIET: He is to avoid significant amounts of dairy products or fresh fruit until this is further cleared by gastroenterology.  He is to avoid alcohol, especially while on the Flagyl therapy.  DISCHARGE INSTRUCTIONS: He is to call if he has any recurrent problems.  FOLLOW-UP: He is to follow up in three weeks with Dr. Waynard Edwards and is to follow up in two weeks with Dr. Madilyn Fireman, and is to follow up routinely with Dr. Deborah Chalk.  However, he will need frequent PT and INR checks, and this has been managed through Dr. Angelina Pih office.  He is advised to present this Friday and the following Monday for PT/INR blood checks and to report his current dose of Coumadin to Dr. Angelina Pih office. Attending Physician:  Ezequiel Kayser DD:  08/29/00 TD:  08/29/00 Job: [id number]

## 2010-05-27 NOTE — Discharge Summary (Signed)
[place]. [facility]  Patient:    Troy Freeman, Troy Freeman Visit Number: [id number] MRN: [id number]          Service Type: MED Location: [phone] Attending Physician:  Ezequiel Kayser Dictated by:   Lissa Merlin, P.A. Admit Date:  08/24/2000 Discharge Date: 08/29/2000   CC:         CVTS office  Colleen Can. Deborah Chalk, M.D.  Rodrigo Ran, M.D.  Charolett Bumpers III, M.D.   Discharge Summary  DATE OF BIRTH:  1920-10-12  SURGEON:  Alleen Borne, M.D.  CARDIOLOGIST:  Colleen Can. Deborah Chalk, M.D.  PRIMARY CARE PHYSICIAN:  Rodrigo Ran, M.D.  GASTROENTEROLOGIST:  Verlin Grills, M.D.  ADMISSION DIAGNOSIS:  Acute anterior septal myocardial infarction.  DISCHARGE DIAGNOSES: 1. Status post acute myocardial infarction. 2. Severe three-vessel coronary artery disease. 3. Postoperative atrial fibrillation and atrial flutter. 4. Mild postoperative anemia. 5. Initiation of chronic Coumadin therapy for large myocardial infarction/    atrial fibrillation.  EXISTING MEDICAL CONDITIONS: 1. Chronic recent diarrhea secondary to Welchol treated with Flagyl and Cipro,    ruled out for infectious process. 2. Right hip replacement in 1996. 3. Hypercholesterolemia. 4. Gastroesophageal reflux disease and esophageal dilatation. 5. Hiatal hernia. 6. (No previous history of CAD with negative stress Cardiolite March 2002.)  PROCEDURES: 1. Emergent cardiac catheterization on August 05, 2000 showing severe    three-vessel coronary artery disease, ejection fraction 25% to 30%, with    PTCA of proximal LAD. 2. Pre-CABG Dopplers on August 06, 2000 showed no ICA stenosis and palpable    lower extremity pulses. 3. CABG times six on August 09, 2000 with the following grafts:  LIMA to the    LAD, sequential saphenous vein graft from distal RCA to PD, sequential    saphenous vein graft from OM1 to OM2, saphenous vein graft to diagonal.  HOSPITAL COURSE:  The patient is a  77 year old gentleman with no previous cardiac history who was admitted with a large acute anterior septal myocardial infarction secondary to LAD occlusion.  He was taken to the cardiac catheterization lab emergently where PTCA of the LAD was done.  Cardiac catheterization demonstrated severe three-vessel coronary artery disease and increased left ventricular ejection fraction.  He was stabilized by cardiology and CVTS was consulted.  Meanwhile, his secondary complaint of recent diarrhea was worked up.  A GI consultation was done.  Workup was negative for infectious process.  Diarrhea was ultimately attributed to [facility] use.  The diarrhea got better, and he underwent surgery on August 09, 2000.  There were no complications.  He was taken to SICU in stable condition.  Postoperatively, the patient did well with no major complications.  He was started on Coumadin in light of his recent large anterior infarct with akinesis and his postoperative episode of atrial fibrillation and atrial flutter.  He was also started on digoxin for this.  In other regards, the patient did well postoperatively.  Several days of his postoperative course was waiting for his INR to become therapeutic.  He was also held in surgical intensive care longer than normal because of waiting for a bed on unit 2000.  He was ambulating well.  Vital signs were stable.  Lab work was satisfactory.  Physical exam was satisfactory.  Wounds were healing well.  On August 15, 2000, he was doing well on unit 2000 and suitable for discharge and was subsequently discharged to home.  DISCHARGE MEDICATIONS: 1. Toprol XL 25  mg one p.o. q.a.m. 2. Coumadin 5 mg tablets daily as directed.  He was told to take on tablet on    August 15, 2000 and one tablet on August 16, 2000 and to have his blood drawn    the next day in Dr. Angelina Pih office with subsequent dosing depending on    that. 3. Digoxin 0.125 mg one p.o. q.d. 4. Niferex 150 one  p.o. q.d. 5. Prevacid 30 mg one p.o. q.d. 6. Lasix 20 mg one p.o. q.o.d. 7. Ultram 50 mg tablet 1-2 q.4-6h. as needed for severe pain.  ALLERGIES:  Penicillin.  SPECIAL INSTRUCTIONS:  He was told to do no driving, no strenuous activity, and no lifting over ten pounds.  He was told to walk daily and use his incentive spirometer daily.  He can shower.  He was to clean his wounds gently daily with soap and water and to be alert for increasing redness, swelling, drainage, or fever and to call the office if he had an problems.  He was told the importance of having his blood drawn at Dr. Angelina Pih office on Friday.  He was told to get a chest x-ray when he saw his cardiologist in two weeks and to bring it with him to see Dr. Laneta Simmers.  CONDITION ON DISCHARGE:  Stable.  FOLLOW-UP APPOINTMENT: 1. Pro time, Dr. Angelina Pih office, on August 17, 2000. 2. Dr. Deborah Chalk two weeks after discharge.  The patient is to call and arrange. 3. Dr. Laneta Simmers Tuesday, September 04, 2000, at 9:45 a.m. Dictated by:   Lissa Merlin, P.A. Attending Physician:  Ezequiel Kayser DD:  09/21/00 TD:  09/21/00 Job: [id number]

## 2010-05-27 NOTE — Cardiovascular Report (Signed)
[place]. [facility]  Patient:    Troy Freeman, Troy Freeman                    MRN: [id number] Proc. Date: 08/05/00 Attending:  Colleen Can. Deborah Chalk, M.D. CC:         Rodrigo Ran, M.D.   Cardiac Catheterization  HISTORY:  The patient presents with approximately two-hour onset of substernal chest pain with profound nausea.  He has had diarrhea over the previous six weeks.  PROCEDURE:  Left heart catheterization with selective coronary angiography, left ventricular angiography, and angioplasty of the proximal left anterior descending and mid left anterior descending.  TYPE AND SITE OF ENTRY:  Percutaneous right femoral artery.  CATHETERS:  A 6 French 4 curved Judkins right and left coronary catheters, 6 French pigtail ventriculographic catheter, 7 [place] JL4 guide, Hi-Torque Floppy guide wire and substandard guide wire, 3.0 x 20 mm CrossSail balloon, subsequently a 3.5 x 10 mm Cutting Balloon.  MEDICATIONS GIVEN PRIOR TO THE PROCEDURE:  Heparin, Phenergan.  MEDICATIONS GIVEN DURING THE PROCEDURE:  Integrilin, IV nitroglycerin, heparin.  CONTRAST:  Omnipaque.  COMMENTS:  The patient tolerated the procedure well.  HEMODYNAMIC DATA:  The aortic pressure was 80/46,  LV was 79/21.  There was no aortic valve gradient noted on pullback.  ANGIOGRAPHIC DATA: 1. Left main coronary artery:  Normal. 2. Left circumflex:  The left circumflex has a 70% ostial segmental    narrowing.  It becomes somewhat aneurysmal and then there are two    moderate sized marginal vessels. 3. Left anterior descending:  The left anterior descending is totally    occluded after the first diagonal vessel.  There is a 90% stenosis at the    ostium of the first diagonal which would be approximately a 2 mm vessel.    There are no collaterals to the distal left anterior descending. 4. Right coronary artery:  The right coronary artery has a 90% ostial    stenosis.  There is lumpy-bumpy disease  diffusely in the proximal part    of the right coronary artery.  There is a continuation branch that is    free of significant disease and a bifurcating posterior descending that    appears to have 60-70 plus percent stenosis.  Both branches would probably    need to be bypassed in a sequential manner.  LEFT VENTRICULAR ANGIOGRAM:  Left ventricular angiogram was performed after the angioplasty in the RAO position.  The overall cardiac size was normal. There was rather significant anteroapical akinesia.  There is a tendency toward dyskinesia at the apex.  There was no intracardiac calcification or intracavitary filling defect noted.  There is no mitral regurgitation.  ANGIOPLASTY PROCEDURE:  The JL4 guide was appropriately sized.  We had some difficulty in getting the Hi-Torque Floppy guide wire to pass across the stenosis in the left anterior descending and came back with a Hi-Torque standard and satisfactorily crossed the stenosis.  It was somewhat segmental in length.  It was initially dilated with a 3.0 x 20 mm CrossSail balloon and were able to reestablish flow.  There was a residual stenosis proximally in the left anterior descending and we returned with a 3.5 x 10 mm Cutting Balloon.  This was inflated to a maximum of 8 atmospheres. The final angiographic result showed an approximately 20-30% residual stenosis proximally but we were able to preserve the first diagonal.  There was a second diagonal that was preserved and some sluggish flow  in a small third diagonal branch.  OVERALL IMPRESSION: 1. Acute anterior myocardial infarction with severe left ventricular    dysfunction (ejection fraction of approximately 30%). 2. Severe three-vessel coronary disease with a totally occluded proximal    left anterior descending, 70% left circumflex, 90% right coronary artery. 3. Successful angioplasty of the left anterior descending.  DISCUSSION:  The patient will be managed on  anticoagulation and conservative measures and will proceed on with plans for coronary artery bypass grafting in the near future. DD:  08/05/00 TD:  08/06/00 Job: [id number]

## 2010-05-27 NOTE — H&P (Signed)
[facility]  Patient:    Troy Freeman, Troy Freeman                  MRN: [id number] Adm. Date:  [id number] Attending:  Ezequiel Kayser CC:         Everardo All. Madilyn Fireman, M.D.  Colleen Can. Deborah Chalk, M.D.  Alleen Borne, M.D.   History and Physical  PRIMARY CARE PHYSICIAN: Rodrigo Ran, M.D.  GASTROENTEROLOGIST: Everardo All. Madilyn Fireman, M.D.  CARDIOLOGIST: Colleen Can. Deborah Chalk, M.D.  CARDIOVASCULAR SURGEON: Alleen Borne, M.D.  CHIEF COMPLAINT: Weakness, fatigue, diarrhea.  HISTORY OF PRESENT ILLNESS: Mr. Durnin is a 77 year old male with atherosclerotic coronary artery disease, status post recent large anterior MI at the end of July 2002, with subsequent six-vessel coronary artery bypass grafting on August 09, 2000.  The patient had had two to three weeks of diarrhea prior to this event.  His loose stools did seem better while in the hospital.  However, upon discharge the family reports return of diarrhea with progressive fatigue and weight loss.  The patient has been undergoing evaluation per gastroenterology.  However, due to concern by the patient and family over worsening weakness and weight loss he has elected to have direct admission for further evaluation and care.  Of note, the patient presented to the emergency room three days ago and laboratory work-up did not show any new findings.  He denies any significant chest pain though he did say his chest felt somewhat heavy for a brief period this morning.  PAST MEDICAL HISTORY:  1. Atherosclerotic coronary artery disease, status post acute large     anterior MI, status post PTCA to the LAD, with ejection fraction 25-30%,     and large akinetic anteroseptal segment.  2. Status post six-vessel CABG, August 09, 2000.  3. Postoperative A fib, which is resolved but the patient requires     anticoagulation for his anterior MI and akinetic anterior wall.  4. Diarrhea.  5. Gastroesophageal reflux disease.  6.  BPH.  7. Status post right hip replacement in 1996.  8. Hyperlipidemia.  9. Elevated LFTs after Vioxx use.  ALLERGIES:  1. VIOXX.  2. PENICILLIN.  MEDICATIONS:  1. Toprol XL 25 mg q.d.  2. Coumadin 5 mg q.d.  3. Digoxin 0.125 mg q.d.  4. Niferex 150 mg q.d.  5. Prevacid 30 mg q.d., which he has not been taking for the last three days.  6. Ultram q.6h p.r.n., which he has not needed except for one or two doses in     the last two weeks.  7. Lasix 20 mg every other day.  This has been on hold for the last two to     three days.  8. The patient has taken a few Tums recently and has also stopped eating     fresh fruit and dairy products the last three days.  SOCIAL HISTORY: The patient has been married since 39 and has two children, a son and a daughter, and four grandchildren.  He quit tobacco in 1964.  He denies any alcohol or drug use.  FAMILY HISTORY: Father died at age 36 of Alzheimers and stroke.  Mother died at age 40 of old age.  REVIEW OF SYSTEMS: For the last two days the patient denies any fever or chills.  He does have nausea and anorexia and poor p.o. intake.  He had no stools on Tuesday and Wednesday of this week and had two somewhat formed stools on  Thursday, which was two days prior to admission (yesterday), and this morning had one very loose bowel movement.  One week ago he had four or five days of approximately two very watery stools each day.  He denies any blood.  He denies any abdominal pain except he does have some gas occasionally, but he does not think this is an increased amount of gas.  He denies any significant shortness of breath or wheezing, although he does get very dyspneic with any exertion.  He has had no significant lower extremity edema.  His weakness is generalized and nonfocal.  PHYSICAL EXAMINATION:  VITAL SIGNS: Temperature 97.4 degrees, blood pressure 118/75, pulse 72, respiratory rate 18.  Weight 180.2 pounds.  His weight prior to  all of this approximately four to six weeks ago was 210 pounds.  His last documented weight in our clinic was 203 pounds on July 30, 2000.  O2 saturation 96% on room air.  GENERAL: He is semisupine, thin, in no acute distress, alert and oriented x 4.  HEENT: PERRL.  Cranial nerves 2-12 intact.  NECK: No JVD.  CHEST: Lungs clear to auscultation bilaterally with no wheezes, rales or rhonchi.  ABDOMEN: Normoactive bowel sounds.  Soft, nontender, nondistended.  No masses or hepatosplenomegaly.  HEART: Regular with no murmurs, rubs, or gallops.  S1 and S2 distant.  EXTREMITIES: No edema except for some trace pitting edema of the right ankle at the site of vein harvest.  LABORATORY DATA: EKG shows normal sinus rhythm with first degree AV block, anterior T waves with anteroseptal changes of recent acute MI, with changes consistent with evolving electrical changes after the MI; this is compared to August 09, 2000.  His last echocardiogram was in July 2002, which showed an EF of 25-35% with anterior akinesis and tricuspid regurgitation.  Today troponin is 0.14, which is elevated.  CK enzyme is 88 with CK-MB of 3.6. Peak CK enzymes was 3600 at the time of his MI.  LFTs are normal with the exception of an albumin of 3.1.  Phosphorus 4.1, magnesium 2.3.  Potassium 4.1, sodium 137, CO2 25, chloride 106, BUN 20, creatinine 1.1, glucose 100. INR is 2.8.  WBC is 6.0 with a normal Differential, hemoglobin 12.0 (improved from 11.3 recently), platelet count 550,000 (slightly elevated).  Chest x-ray is pending.  ASSESSMENT: This is a 77 year old male, status post large anterior myocardial infarction followed by six-vessel coronary artery bypass grafting two weeks prior to admission, with continued loose stools, progressive weakness, poor  appetite, and continued weight loss.  He has now lost over 30 pounds since the beginning of his overall episode.  While a large part of this  patients symptoms are related to decreased left ventricular function after his myocardial infarction and expected weakness after major surgery, the continuing loose stools, poor appetite, and weight loss are somewhat worrisome.  PLAN: We will admit to a telemetry bed and check serial enzymes.  I believe this elevated troponin I is most likely from previous events; however, normal CK enzymes is encouraging.  However, we will follow serial enzymes.  EKG shows evolving changes, which would be expected.  We will follow ins and outs closely and will avoid any dairy products or fresh fruit, and will ask nutrition for further assistance.  We will check a C. difficile toxin x 2 as well as a TSH and prealbumin, and we will send antiagglutinin antibody assay. We will order a CT scan of the abdomen and pelvis to rule out any  gross abnormality, although I believe this will be low-yield.  We will notify GI of the patients presence; however, Dr. Madilyn Fireman has expressed desire to delay any procedures until four to six further weeks due to the patients recent heart events.  We will defer this decision to gastroenterology.  Mr. Jawad is very anxious; however, he does deny depression.  We will add low-dose Paxil CR 12.5 mg in case his elevated anxiety is undermining his recovery.  He does have significant hyperlipidemia but treatment will be deferred until these other issues are settled more.  We will continue Coumadin and follow serial INRs.  We will also hold Lasix unless further signs of volume overload are noted.  The patient is a full code status. DD:  08/24/00 TD:  08/26/00 Job: [id number]

## 2010-08-10 DIAGNOSIS — W19XXXA Unspecified fall, initial encounter: Secondary | ICD-10-CM

## 2010-08-10 HISTORY — DX: Unspecified fall, initial encounter: W19.XXXA

## 2010-08-16 ENCOUNTER — Emergency Department (HOSPITAL_COMMUNITY): Payer: Medicare Other

## 2010-08-16 ENCOUNTER — Emergency Department (HOSPITAL_COMMUNITY)
Admission: EM | Admit: 2010-08-16 | Discharge: 2010-08-16 | Disposition: A | Discharge status: Home / Self Care | Payer: Medicare Other | Attending: Emergency Medicine | Admitting: Emergency Medicine

## 2010-08-16 DIAGNOSIS — M25569 Pain in unspecified knee: Secondary | ICD-10-CM | POA: Insufficient documentation

## 2010-08-16 DIAGNOSIS — I252 Old myocardial infarction: Secondary | ICD-10-CM | POA: Insufficient documentation

## 2010-08-16 DIAGNOSIS — N400 Enlarged prostate without lower urinary tract symptoms: Secondary | ICD-10-CM | POA: Insufficient documentation

## 2010-08-16 DIAGNOSIS — K219 Gastro-esophageal reflux disease without esophagitis: Secondary | ICD-10-CM | POA: Insufficient documentation

## 2010-08-16 DIAGNOSIS — I4891 Unspecified atrial fibrillation: Secondary | ICD-10-CM | POA: Insufficient documentation

## 2010-08-16 DIAGNOSIS — S7000XA Contusion of unspecified hip, initial encounter: Secondary | ICD-10-CM | POA: Insufficient documentation

## 2010-08-16 DIAGNOSIS — E7800 Pure hypercholesterolemia, unspecified: Secondary | ICD-10-CM | POA: Insufficient documentation

## 2010-08-16 DIAGNOSIS — W08XXXA Fall from other furniture, initial encounter: Secondary | ICD-10-CM | POA: Insufficient documentation

## 2010-08-16 DIAGNOSIS — M25559 Pain in unspecified hip: Secondary | ICD-10-CM | POA: Insufficient documentation

## 2010-08-16 DIAGNOSIS — Z96649 Presence of unspecified artificial hip joint: Secondary | ICD-10-CM | POA: Insufficient documentation

## 2010-08-16 DIAGNOSIS — I2510 Atherosclerotic heart disease of native coronary artery without angina pectoris: Secondary | ICD-10-CM | POA: Insufficient documentation

## 2010-08-16 DIAGNOSIS — Z951 Presence of aortocoronary bypass graft: Secondary | ICD-10-CM | POA: Insufficient documentation

## 2010-08-16 DIAGNOSIS — R262 Difficulty in walking, not elsewhere classified: Secondary | ICD-10-CM | POA: Insufficient documentation

## 2010-08-16 DIAGNOSIS — I440 Atrioventricular block, first degree: Secondary | ICD-10-CM | POA: Insufficient documentation

## 2010-08-20 ENCOUNTER — Inpatient Hospital Stay (HOSPITAL_COMMUNITY)
Admission: EM | Admit: 2010-08-20 | Discharge: 2010-08-23 | DRG: 536 | Disposition: A | Discharge status: Skilled Nursing Facility | Payer: Medicare Other | Attending: Internal Medicine | Admitting: Internal Medicine

## 2010-08-20 ENCOUNTER — Emergency Department (HOSPITAL_COMMUNITY): Payer: Medicare Other

## 2010-08-20 DIAGNOSIS — I2510 Atherosclerotic heart disease of native coronary artery without angina pectoris: Secondary | ICD-10-CM | POA: Diagnosis present

## 2010-08-20 DIAGNOSIS — I714 Abdominal aortic aneurysm, without rupture, unspecified: Secondary | ICD-10-CM | POA: Diagnosis present

## 2010-08-20 DIAGNOSIS — W08XXXA Fall from other furniture, initial encounter: Secondary | ICD-10-CM | POA: Diagnosis present

## 2010-08-20 DIAGNOSIS — Y92009 Unspecified place in unspecified non-institutional (private) residence as the place of occurrence of the external cause: Secondary | ICD-10-CM

## 2010-08-20 DIAGNOSIS — I4891 Unspecified atrial fibrillation: Secondary | ICD-10-CM | POA: Diagnosis present

## 2010-08-20 DIAGNOSIS — S72109A Unspecified trochanteric fracture of unspecified femur, initial encounter for closed fracture: Principal | ICD-10-CM | POA: Diagnosis present

## 2010-08-20 DIAGNOSIS — M25559 Pain in unspecified hip: Secondary | ICD-10-CM | POA: Diagnosis present

## 2010-08-20 DIAGNOSIS — Z951 Presence of aortocoronary bypass graft: Secondary | ICD-10-CM

## 2010-08-20 DIAGNOSIS — E785 Hyperlipidemia, unspecified: Secondary | ICD-10-CM | POA: Diagnosis present

## 2010-08-20 DIAGNOSIS — Z7901 Long term (current) use of anticoagulants: Secondary | ICD-10-CM

## 2010-08-20 DIAGNOSIS — E871 Hypo-osmolality and hyponatremia: Secondary | ICD-10-CM | POA: Diagnosis present

## 2010-08-20 DIAGNOSIS — K219 Gastro-esophageal reflux disease without esophagitis: Secondary | ICD-10-CM | POA: Diagnosis present

## 2010-08-20 DIAGNOSIS — K5900 Constipation, unspecified: Secondary | ICD-10-CM | POA: Diagnosis not present

## 2010-08-20 DIAGNOSIS — I5022 Chronic systolic (congestive) heart failure: Secondary | ICD-10-CM | POA: Diagnosis present

## 2010-08-20 DIAGNOSIS — I509 Heart failure, unspecified: Secondary | ICD-10-CM | POA: Diagnosis present

## 2010-08-20 DIAGNOSIS — I10 Essential (primary) hypertension: Secondary | ICD-10-CM | POA: Diagnosis present

## 2010-08-20 DIAGNOSIS — I252 Old myocardial infarction: Secondary | ICD-10-CM

## 2010-08-20 DIAGNOSIS — Z96649 Presence of unspecified artificial hip joint: Secondary | ICD-10-CM

## 2010-08-20 LAB — CBC
Hemoglobin: 12.9 g/dL — ABNORMAL LOW (ref 13.0–17.0)
MCH: 31.9 pg (ref 26.0–34.0)
MCV: 90.8 fL (ref 78.0–100.0)
RBC: 4.04 MIL/uL — ABNORMAL LOW (ref 4.22–5.81)

## 2010-08-20 LAB — BASIC METABOLIC PANEL
BUN: 19 mg/dL (ref 6–23)
Chloride: 94 mEq/L — ABNORMAL LOW (ref 96–112)
Glucose, Bld: 94 mg/dL (ref 70–99)
Potassium: 4.6 mEq/L (ref 3.5–5.1)

## 2010-08-20 LAB — DIFFERENTIAL
Eosinophils Absolute: 0.1 10*3/uL (ref 0.0–0.7)
Lymphs Abs: 1.5 10*3/uL (ref 0.7–4.0)
Monocytes Relative: 12 % (ref 3–12)
Neutrophils Relative %: 65 % (ref 43–77)

## 2010-08-21 LAB — TSH: TSH: 3.488 u[IU]/mL (ref 0.350–4.500)

## 2010-08-21 LAB — BASIC METABOLIC PANEL
BUN: 17 mg/dL (ref 6–23)
Calcium: 9.1 mg/dL (ref 8.4–10.5)
GFR calc non Af Amer: >60 mL/min (ref >60)
Glucose, Bld: 95 mg/dL (ref 70–99)

## 2010-08-21 LAB — LIPID PANEL
HDL: 52 mg/dL (ref >39)
LDL Cholesterol: 76 mg/dL (ref 0–99)
Total CHOL/HDL Ratio: 2.8 RATIO

## 2010-08-21 LAB — CBC
HCT: 36.4 % — ABNORMAL LOW (ref 39.0–52.0)
Hemoglobin: 12.7 g/dL — ABNORMAL LOW (ref 13.0–17.0)
MCH: 31.6 pg (ref 26.0–34.0)
MCHC: 34.9 g/dL (ref 30.0–36.0)

## 2010-08-22 LAB — COMPREHENSIVE METABOLIC PANEL
AST: 25 U/L (ref 0–37)
Albumin: 3.2 g/dL — ABNORMAL LOW (ref 3.5–5.2)
BUN: 17 mg/dL (ref 6–23)
CO2: 26 mEq/L (ref 19–32)
Calcium: 8.9 mg/dL (ref 8.4–10.5)
Chloride: 95 mEq/L — ABNORMAL LOW (ref 96–112)
Creatinine, Ser: 0.71 mg/dL (ref 0.50–1.35)
GFR calc non Af Amer: >60 mL/min (ref >60)
Total Bilirubin: 0.8 mg/dL (ref 0.3–1.2)

## 2010-08-22 LAB — DIFFERENTIAL
Basophils Relative: 0 % (ref 0–1)
Eosinophils Absolute: 0.1 10*3/uL (ref 0.0–0.7)
Eosinophils Relative: 1 % (ref 0–5)
Monocytes Relative: 12 % (ref 3–12)
Neutrophils Relative %: 71 % (ref 43–77)

## 2010-08-22 LAB — MAGNESIUM: Magnesium: 1.3 mg/dL — ABNORMAL LOW (ref 1.5–2.5)

## 2010-08-22 LAB — CBC
MCH: 31.6 pg (ref 26.0–34.0)
Platelets: 267 10*3/uL (ref 150–400)
RBC: 3.86 MIL/uL — ABNORMAL LOW (ref 4.22–5.81)
RDW: 12.5 % (ref 11.5–15.5)

## 2010-08-22 LAB — APTT: aPTT: 48 seconds — ABNORMAL HIGH (ref 24–37)

## 2010-08-22 LAB — PROTIME-INR: INR: 2.46 — ABNORMAL HIGH (ref 0.00–1.49)

## 2010-08-23 LAB — COMPREHENSIVE METABOLIC PANEL
ALT: 16 U/L (ref 0–53)
AST: 26 U/L (ref 0–37)
Albumin: 3.2 g/dL — ABNORMAL LOW (ref 3.5–5.2)
Calcium: 8.8 mg/dL (ref 8.4–10.5)
GFR calc Af Amer: >60 mL/min (ref >60)
Glucose, Bld: 97 mg/dL (ref 70–99)
Potassium: 4.1 mEq/L (ref 3.5–5.1)
Sodium: 127 mEq/L — ABNORMAL LOW (ref 135–145)
Total Protein: 6.5 g/dL (ref 6.0–8.3)

## 2010-08-23 LAB — APTT: aPTT: 46 seconds — ABNORMAL HIGH (ref 24–37)

## 2010-08-23 LAB — DIFFERENTIAL
Basophils Absolute: 0 10*3/uL (ref 0.0–0.1)
Basophils Relative: 0 % (ref 0–1)
Eosinophils Absolute: 0.1 10*3/uL (ref 0.0–0.7)
Neutro Abs: 4.3 10*3/uL (ref 1.7–7.7)
Neutrophils Relative %: 64 % (ref 43–77)

## 2010-08-23 LAB — CBC
Hemoglobin: 12.4 g/dL — ABNORMAL LOW (ref 13.0–17.0)
Platelets: 267 10*3/uL (ref 150–400)
RBC: 3.91 MIL/uL — ABNORMAL LOW (ref 4.22–5.81)
WBC: 6.7 10*3/uL (ref 4.0–10.5)

## 2010-08-23 LAB — PROTIME-INR
INR: 2.34 — ABNORMAL HIGH (ref 0.00–1.49)
Prothrombin Time: 26 seconds — ABNORMAL HIGH (ref 11.6–15.2)

## 2010-08-23 NOTE — Discharge Summary (Signed)
NAME:  Troy Freeman, Troy Freeman           ACCOUNT NO.:  [id number]  MEDICAL RECORD NO.:  [id number]  LOCATION:  1611                         FACILITY:  [facility]  PHYSICIAN:  Talmage Nap, MD  DATE OF BIRTH:  1920/10/06  DATE OF ADMISSION:  08/20/2010 DATE OF DISCHARGE:  08/23/2010                        DISCHARGE SUMMARY - REFERRING   PRIMARY CARE PHYSICIAN:  Bertram Millard. Hyacinth Meeker, MD  ORTHOPEDIC SURGEON:  Madlyn Frankel. Charlann Boxer, MD  CONSULTANTS:  Orthopedic surgeon, Dr. Jene Every.  DISCHARGE DIAGNOSES: 1. Acute non-displaced fracture of the greater trochanter of the right     femur with no hardware failure.  The patient to have nonsurgical     management, i.e. Physical and Occupational Therapy/Rehabilitation. 2. Hyponatremia (acute versus chronic). 3. Hypomagnesemia. 4. History of coronary artery disease, status post coronary artery     bypass graft. 5. Chronic atrial fibrillation, on anticoagulation. 6. Hypertension. 7. Chronic(systolic dysfunction)congestive heart failure/cardiomyopathy.      2-D echo done in 2010 showed ejection fraction of 30% to 35%. 8. Hyperlipidemia. 9. Gastroesophageal reflux disease. 10.Hypertension. 11.Small descending abdominal aortic aneurysm 3.1 cm.  BRIEF HISTORY AND PHYSICAL:  The patient is an 77 years old Caucasian male with a history of chronic atrial fibrillation, on anticoagulation with Coumadin; coronary artery disease, status post CABG; and chronic CHF/cardiomyopathy, not on ACE inhibitor or ARB's, who was admitted to the hospital on August 20, 2010, by Dr. Peggye Pitt with 3-day history of right hip pain.  The patient was said to have been at the basement of his house refurbishing some furniture and sat on a rolling stool and accidentally slipped and fell underneath.  He was said to have fallen on his right hip.  The patient, however, was able to ambulate but continued to have persistent pain in the right hip and subsequently presented  to the hospital to be evaluated.  PREADMISSION MEDICATIONS: 1. Metoprolol 25 mg p.o. daily. 2. Coumadin 4 mg, alternate with 4.5 mg. 3. Tylenol 500 mg 4 tablets taken daily. 4. Omeprazole 10 mg p.o. daily. 5. Simvastatin 40 mg p.o. daily. 6. Calcium with vitamin D, 3 tablets p.o. daily. 7. Tramadol 50 mg 1 to 2 tablets p.o. q.12 h.  ALLERGIES:  PENICILLIN, CODEINE, and VIOXX.  SOCIAL HISTORY:  Negative for alcohol or tobacco use.  Lives at home with his spouse.  FAMILY HISTORY:  Positive for coronary artery disease.  REVIEW OF SYSTEMS:  Essentially as documented in the initial history and physical.  At the time the patient was seen by the admitting physician, vital signs: Blood pressure was 128/65, heart rate was 67, respiratory rate 20, temperature 98.1, saturating 97% on room air.  HEENT:  Pupils were reactive to light and extraocular muscles intact.  Neck:  He had no jugular venous distention.  No carotid bruit.  No lymphadenopathy. Chest:  Clear to auscultation.  Heart:  Heart sounds are irregularly irregular with questionable rales at the bases.  Abdomen:  Soft, nontender.  Liver, spleen, kidney not palpable.  Bowel sounds are positive.  Extremities:  No pedal edema.  Neurologic:  Nonfocal. Musculoskeletal:  Arthritic changes in the knees and in the feet. Neuropsychiatric:  Unremarkable.  LABORATORY DATA:  Initial complete blood count with differential  showed WBC of 7.2, hemoglobin of 12.9, hematocrit of 36.7, MCV of 90.8 with a platelet count of 276; normal differential.  Coagulation profile showed PT 31.6, INR 3.00.  Basic metabolic panel showed sodium of 131, potassium of 4.6, chloride of 94 with a bicarb of 28, glucose is 94, BUN is 19, creatinine 0.89.  A repeat complete blood count with differential done on August 21, 2010, showed WBCs of 7.3, hemoglobin of 12.7, hematocrit of 36.4, MCV of 90.5 with a platelet count of 258.  A repeat basic metabolic panel done on  August 21, 2010, showed sodium of 134, potassium of 4.2, chloride of 97 with a bicarb of 27, glucose 95, BUN 17, creatinine 0.77.  TSH 3.488.  Lipid panel unremarkable.  A repeat comprehensive metabolic panel done on August 23, 2010, showed sodium of 127, potassium of 4.1, chloride of 94 with a bicarb of 24, glucose 97, BUN 19, creatinine 0.68; magnesium level 1.4.  Coagulation profile showed PTT 46, PT 26, and INR 2.34.  And a complete blood count with differential showed WBC of 6.7, hemoglobin of 12.4, hematocrit of 35.0, MCV of 89.5 with a platelet count of 267 with normal differential.  IMAGING STUDIES: 1. X-ray of the right femur, two views, which showed an acute     nondisplaced fracture to the right greater trochanter.  There is     preserved alignment of the right total hip arthroplasty and no     hardware failure. 2. CT of the pelvis without contrast showed right total hip     replacement with a mildly displaced greater trochanteric fracture     identified. No other acute bony or joint abnormalities seen.  A     small descending abdominal aortic aneurysm 3.1 cm and this is     incompletely visualized. 3. 2-D echo done September 2010 showed normal left ventricular cavity,     EF is severely reduced with 30% to 35% with akinesis of the     anteroseptal myocardium and dyskinesis of the apical myocardium.  HOSPITAL COURSE:  The patient was admitted to the orthopedic floor with an impression of right hip fracture.  He was started on normal saline to go at rate of 50 mL an hour, Zofran 4 mg IV for nausea, and pain control was done with Tylenol as well as Dilaudid.  The patient was started on Coumadin and dosing was done by Pharmacy.  Other medications given to the patient include metoprolol 25 mg p.o. daily, omeprazole 10 mg p.o. daily, Zocor 40 mg p.o. daily, and Os-Cal D 1 tablet p.o. t.i.d.  He was also given Tylenol 500 mg 1 to 2 tablets p.o. q.12 hourly.  The patient was said  to have complained about constipation and he was given Dulcolax suppository per rectum x1 as well as MiraLAX.  He was evaluated by the orthopedic surgeon, Dr. Shelle Iron, who recommended nonsurgical intervention since the patient's hardware was still in place with no failure and the patient is to have PT and OT at the Rehab.  The patient has subsequently been followed by me on a daily basis.  He was seen by me today, which is August 23, 2010.  Denied any specific complaints.  Examination showed questionable bibasal rales.  Vital Signs: Blood pressure is 138/72, temperature is 98.1, pulse is 71, respiratory rate is 16.  The patient is medically stable.  DISPOSITION/DISCHARGE INSTRUCTIONS: 1. Plan is for the patient to be discharged to rehab today on activity  as tolerated, daily weights, fluid restriction, and he is to be     followed up by the orthopedic surgeon, either Dr. Durene Romans or     Dr. Jene Every in 2 weeks.  Since the patient has a history of     cardiomyopathy with an EF of 30% to 35%, Lasix as well as Diovan     will be added to the patient's regimen.  And since he is     hypomagnesemic, he will also be discharged on magnesium oxide.  DISCHARGE MEDICATIONS: 1. Artificial tears ophthalmic 1 drop both eyes daily p.r.n. 2. Diovan 80 mg 1 p.o. daily. 3. Potassium chloride 10 mEq 1 p.o. b.i.d. 4. Magnesium oxide 400 mg 1 p.o. b.i.d. 5. Lasix 40 mg 1 p.o. daily. 6. B[employer]ch and Lomb Advanced Eye Relief (glycerin 0.3%, propylene     glycol 1.0%) Dry Eye Rejuvenation Lubricant Eyedrops 1 drop right     eye daily p.r.n. 7. Calcium carbonate/Vitamin D 600 mg over-the-counter 1 p.o. t.i.d. 8. Toprol XL (metoprolol succinate) 25 mg one p.o. at bedtime. 9. Omeprazole 20 mg 1 capsule p.o. q.a.m. 10.Simvastatin 40 mg p.o. at bedtime. 11.Tramadol 50 mg 1 to 2 tablets p.o. q.12 p.r.n. 12.Tylenol Extra Strength (acetaminophen) 500 mg 2 tablets p.o. b.i.d. 13.Warfarin 4 mg 1 p.o. by  mouth at bedtime.     Talmage Nap, MD     CN/MEDQ  D:  08/23/2010  T:  08/23/2010  Job:  [id number]  cc:   Bertram Millard. Hyacinth Meeker, M.D. Fax: [phone]  [facility] at [facility]  Electronically Signed by Talmage Nap  on 08/23/2010 03:37:59 PM

## 2010-08-24 ENCOUNTER — Telehealth: Payer: Self-pay | Admitting: Cardiology

## 2010-08-24 NOTE — Telephone Encounter (Signed)
Called because her husband fell and broke his hip and was taken to [facility] where they prescribed some blood pressure medication (Diavan) and told his wife that he has congestive heart failure. He is now at the  [facility] in [place] and his wife does not think that he needs to be on high blood pressure medication and would like to speak with someone here. Please call back. I have pulled his chart.

## 2010-08-24 NOTE — Telephone Encounter (Signed)
Called stating that husband fell and broke hip. While at [facility] the doctors put him on Diovan because BP went up "one time to 147/80". States normally BP is 126/60. States he is at [facility] and they are still giving him the BP med. Advised she will need to ask Physician at Rehab center if he needs to be taking medication. Scheduled to see Dr. Sw[place] in Jan

## 2010-08-26 ENCOUNTER — Telehealth: Payer: Self-pay | Admitting: Cardiology

## 2010-08-26 NOTE — Telephone Encounter (Signed)
Spoke w/daughter and she   wanted to understand why he was taking BP med.  Reviewed d/c w/.Lawson Fiscal and the reason he is on Diovan is not for BP but for CHF and cardiomyopathy. She understands and will try to explain to her mother. She states he not able to be travel to come to office. Advised that Lawson Fiscal would be glad to see him and go over everything once he is able to travel. She is very appreciative of reviewing everything with her. She asked that we call her if have any questions.

## 2010-08-26 NOTE — Telephone Encounter (Signed)
Patient fell and was admitted to [facility].  Patient was placed on BP meds.  Now patient is in Rehab center [name]-Dr. Darlina Guys Internist in charge....[phone].    Daughter needs physician to speak with rehab about talking patient off BP meds.

## 2010-09-21 NOTE — H&P (Signed)
NAME:  Troy Freeman, Troy Freeman           ACCOUNT NO.:  [id number]  MEDICAL RECORD NO.:  [id number]  LOCATION:  WLED                         FACILITY:  [facility]  PHYSICIAN:  Troy Freeman, M.D. DATE OF BIRTH:  [birth year]  DATE OF ADMISSION:  08/20/2010 DATE OF DISCHARGE:                             HISTORY & PHYSICAL   ORTHOPEDIST:  Troy Frankel. Charlann Boxer, MD  CHIEF COMPLAINT:  Right hip pain.  HISTORY OF PRESENT ILLNESS:  [name] is a pleasant 77 year old Caucasian gentleman who has a history of coronary artery disease, status post MI and CABG in the past, as well as chronic atrial fibrillation, on anticoagulation with Coumadin.  Three days ago, he went down to his basement to refinish some furniture, sat on a rolling stool and the stool slipped out from under him.  He fell on his right hip and immediately experienced pain; however, he was able to ambulate.  Because of continued pain, he came to the emergency department, at which time films were done and were found to be unremarkable.  He was given some tramadol and sent home.  Over the past 3 days', he has continued to have pain over his right lateral hip, especially with moving, to the point where now he is unable to perform his activities of daily living secondary to this.  Repeat imaging of the hip was performed today.  On the right femur x-ray, he was found to have an acute nondisplaced fracture throughout the greater trochanter with preserved alignment of right total hip arthroplasty and no hardware failure is identified. Because of this, the ED physician has consulted with Dr. Shelle Freeman, who is on-call for Dr. Nilsa Freeman group.  He has recommended admission for pain control and physical and occupational therapy.  He believes that no surgical intervention is appropriate at this time.  Because of this, we were asked to admit him for further evaluation and management.  ALLERGIES:  He has stated allergy to PENICILLIN.  The reaction is  "he had swelling of the heart."  He also has an intolerance to CODEINE which causes nausea and vomiting.  PAST MEDICAL HISTORY:  Significant for: 1. Chronic atrial fibrillation, anticoagulated with Coumadin. 2. Old anterior MI, status post CABG. 3. He has had a syncope in the past secondary to bradycardia, and his     metoprolol was only reinstated a few months ago. 4. Hyperlipidemia. 5. GERD. 6. Chronic systolic CHF with ejection fraction of 30% to 35% per     echocardiogram in 2010.  HOME MEDICATIONS: 1. Metoprolol 25 mg daily. 2. Coumadin alternating 4 mg with 4.5 mg. 3. Tylenol 500 mg of which he takes 4 tablets daily. 4. Omeprazole 10 mg daily. 5. Simvastatin 40 mg daily. 6. Calcium and vitamin D 3 tablets daily. 7. Tramadol 50 mg 1 to 2 tablets every 12 hours (this was just started     3 days' ago by the emergency department).  SOCIAL HISTORY:  He quit smoking over 40 years' ago.  No alcohol or illicit drug use.  Lives with his wife who is present at the time of my exam today.  They have been married 63 years. FAMILY HISTORY:  Very positive for heart disease and in fact  they had a son who just passed away 6 months' ago in 07-Mar-2022 at age 29 with a fatal MI.  REVIEW OF SYSTEMS:  Negative except as mentioned in the history of present illness.  PHYSICAL EXAM:  VITAL SIGNS ON ADMISSION:  Blood pressure 128/65, heart rate 67, respirations 20, temperature of 98.1, saturations of 97% on room air. GENERAL:  He is alert, awake, oriented x3, in no distress.  He is lying in bed and able to provide me with full history. HEENT:  Normocephalic, atraumatic.  Pupils are equal, round and reactive to light.  He is somewhat hard of hearing. NECK:  Supple.  No JVD.  No lymphadenopathy.  No bruits.  No goiter. HEART:  Irregularly irregular.  I cannot identify any murmurs. LUNGS:  Clear bilaterally. ABDOMEN:  Soft, nontender, nondistended.  Positive bowel sounds. EXTREMITIES:  No edema.   Positive pulses. NEUROLOGIC:  Grossly intact and nonfocal although I have not performed a detailed neurologic exam.  LABS ON ADMISSION:  Sodium 131, potassium 4.6, chloride 94, bicarb 28, BUN 19, creatinine 0.89, glucose 94.  WBC 7.3, hemoglobin 12.9, platelets of 276.  His INR is therapeutic at 3.  IMAGING STUDIES: 1. A right femur x-ray that shows an acute nondisplaced fracture     through the right greater trochanter with preserved alignment of     the right total hip arthroplasty and no hardware failure. 2. A CT of the pelvis has been ordered and results are pending at this     time.  ASSESSMENT AND PLAN: 1. Right greater trochanter fracture:  At this time, plan is to admit     him for pain control and PT and OT evaluation.  The patient and     wife understand that following therapy recommendations, he may need     to go to a skilled nursing facility for rehabilitation purposes and     they agreed to this, if needed.  Dr. Fonnie Freeman, EDP, has spoken with     Dr. Shelle Freeman who will see the patient in the morning but he has     anticipated that no surgical intervention is required at this time. 2. For his atrial fibrillation:  He is currently rate controlled.  He     is maintained on chronic anticoagulation with Coumadin.  I will     have Pharmacy dose his anticoagulation. 3. For hyperlipidemia, I will continue his Zocor and check a fasting     lipid profile. 4. For his gastroesophageal reflux disease.  I will continue his     proton-pump inhibitor. 5. For deep venous thrombosis prophylaxis, he is already adequately     anticoagulated on Coumadin which we will continue.     Troy Freeman, M.D.     EH/MEDQ  D:  08/20/2010  T:  08/20/2010  Job:  [id number]  cc:   Troy Freeman, M.D. Fax: [phone]  Electronically Signed by Troy Freeman M.D. on 09/21/2010 02:24:48 PM

## 2010-10-07 ENCOUNTER — Telehealth: Payer: Self-pay | Admitting: Cardiology

## 2010-10-07 NOTE — Telephone Encounter (Signed)
Pt wife has questions re meds that pt was put on at the hospital , wants an appt to discuss this, I offered a call to discuss the meds but she said it was an emergency and he had to be seen, denies any problems however, dr Swaziland doesn't have open appts for a while, so I told her the nurse would call and see if she can answer her questions then go from there if an appt is needed

## 2010-10-07 NOTE — Telephone Encounter (Signed)
Wife called stating he had been in hospital and the doctors have changed all his medications. Has been on Toprol and Carvedilol until yesterday when saw PCP and she stopped his Toprol. Wants an app to discuss meds. Gave her an app w/Lori on Tues 10/2. Advised to bring all meds.

## 2010-10-11 ENCOUNTER — Encounter: Payer: Self-pay | Admitting: Nurse Practitioner

## 2010-10-11 ENCOUNTER — Ambulatory Visit (INDEPENDENT_AMBULATORY_CARE_PROVIDER_SITE_OTHER): Payer: Medicare Other | Admitting: Nurse Practitioner

## 2010-10-11 DIAGNOSIS — I4891 Unspecified atrial fibrillation: Secondary | ICD-10-CM | POA: Insufficient documentation

## 2010-10-11 DIAGNOSIS — I498 Other specified cardiac arrhythmias: Secondary | ICD-10-CM

## 2010-10-11 DIAGNOSIS — R001 Bradycardia, unspecified: Secondary | ICD-10-CM

## 2010-10-11 DIAGNOSIS — I2589 Other forms of chronic ischemic heart disease: Secondary | ICD-10-CM

## 2010-10-11 DIAGNOSIS — I255 Ischemic cardiomyopathy: Secondary | ICD-10-CM | POA: Insufficient documentation

## 2010-10-11 LAB — BASIC METABOLIC PANEL
BUN: 23 mg/dL (ref 6–23)
CO2: 27 mEq/L (ref 19–32)
Calcium: 9.2 mg/dL (ref 8.4–10.5)
Chloride: 97 mEq/L (ref 96–112)
Creatinine, Ser: 1.2 mg/dL (ref 0.4–1.5)
GFR: 59.51 mL/min — ABNORMAL LOW (ref >60.00)
Glucose, Bld: 97 mg/dL (ref 70–99)
Potassium: 4.6 mEq/L (ref 3.5–5.1)
Sodium: 133 mEq/L — ABNORMAL LOW (ref 135–145)

## 2010-10-11 LAB — MAGNESIUM: Magnesium: 1.8 mg/dL (ref 1.5–2.5)

## 2010-10-11 MED ORDER — POTASSIUM CHLORIDE ER 10 MEQ PO CPCR: ORAL_CAPSULE | ORAL | Status: DC

## 2010-10-11 MED ORDER — FUROSEMIDE 20 MG PO TABS: ORAL_TABLET | ORAL | Status: DC

## 2010-10-11 NOTE — Assessment & Plan Note (Signed)
Rate is currently ok.

## 2010-10-11 NOTE — Assessment & Plan Note (Signed)
Managed with coumadin and rate control. INRs are followed by [facility].

## 2010-10-11 NOTE — Patient Instructions (Signed)
Stop the metoprolol.  Only use the Lasix and Potassium for swelling or weight gain of 3 pounds in 24 hours. We are going to check your labs and see if you need to stay on the Magnesium.  I want to see you in a month. Call for any problems.

## 2010-10-11 NOTE — Progress Notes (Signed)
Troy Freeman Date of Birth: 03-08-1920   History of Present Illness: Troy Freeman is seen today for a post hospital visit. He is seen for Dr. [place]. He is a former patient of Dr. Ronnald Nian. He has had remote anterior MI and had CABG. EF is chronically reduced at 30 to 35%. He was just discharged from rehab after slipping off of a rolling chair and falling. He had a broken right hip. Coreg, Lasix and Lisinopril were added. His wife is concerned as to whether he really needs all of this. He was actually on both Coreg and Metoprolol. He has no cardiac complaints. He is not short of breath. No chest pain. No swelling. No PND or orthopnea. He does have some mild dementia. He is back home but still working with rehab. He is using a walker.  Current Outpatient Prescriptions on File Prior to Visit  Medication Sig Dispense Refill  . Acetaminophen (TYLENOL 8 HOUR PO) Take 1,000 mg by mouth 2 (two) times daily.       . Calcium Carbonate-Vitamin D (CALCIUM + D PO) Take 600 mg by mouth daily. 3 TABS DAILY       . carvedilol (COREG) 6.25 MG tablet Take 6.25 mg by mouth 2 (two) times daily with a meal.        . furosemide (LASIX) 20 MG tablet Use prn for weight gain of 3 pounds in 24 hours. Take along with Potassium.      Marland Kitchen lisinopril (PRINIVIL,ZESTRIL) 2.5 MG tablet Take 2.5 mg by mouth daily.        . magnesium oxide (MAG-OX) 400 MG tablet Take 400 mg by mouth 2 (two) times daily.        Marland Kitchen omeprazole (PRILOSEC) 20 MG capsule Take 20 mg by mouth daily.        . potassium chloride (MICRO-K) 10 MEQ CR capsule Take only on days that Lasix is taken.      . simvastatin (ZOCOR) 40 MG tablet Take 40 mg by mouth at bedtime.        . traMADol (ULTRAM) 50 MG tablet Take 50 mg by mouth every 6 (six) hours as needed.        . warfarin (COUMADIN) 1 MG tablet Take 1 mg by mouth as directed.          Allergies  Allergen Reactions  . Codeine   . Penicillins     Past Medical History  Diagnosis Date  .  Palpitations   . Fluttering heart   . Bradycardia   . IHD (ischemic heart disease) July 2002    Large anterior MI with PCI to LAD with subsequent CABG x 6 in 2002  . LV dysfunction     EF is 30 to 35%  . MI, old     OLD ANTERIOR MI  . Atrial fibrillation     managed on coumadin and with rate control  . S/P CABG (coronary artery bypass graft) 2002  . Fall Aug 2012    with fractured right hip  . BPH (benign prostatic hyperplasia)   . GERD (gastroesophageal reflux disease)   . Dementia     mild  . Chronic anticoagulation     Past Surgical History  Procedure Date  . Coronary angioplasty 07/2000    LAD  . Coronary artery bypass graft 08/2000    LIMA to LAD, SVG to RCA and PD, SVG to OM1 and OM2, and SVG to DX  . Right hip surgery 1996 &  2012  . Left hip surgery 2007    History  Smoking status  . Former Smoker  . Quit date: 01/09/1958  Smokeless tobacco  . Not on file    History  Alcohol Use No    Family History  Problem Relation Age of Onset  . Cancer Mother   . Heart disease Father     Review of Systems: The review of systems is positive for mild dementia. No cardiac complaints.  All other systems were reviewed and are negative.  Physical Exam: BP 112/84  Pulse 74  Ht 6\' 1"  (1.854 m)  Wt 196 lb 12.8 oz (89.268 kg)  BMI 25.96 kg/m2 Patient is very pleasant and in no acute distress. Skin is warm and dry. Color is normal.  HEENT is unremarkable. Normocephalic/atraumatic. PERRL. Sclera are nonicteric. Neck is supple. No masses. No JVD. Lungs are clear. Cardiac exam shows an irregular rhythm. Rate is controlled. Abdomen is soft. Extremities are without edema. Gait and ROM are intact. No gross neurologic deficits noted.  LABORATORY DATA: BMET and Mg are pending.    Assessment / Plan:

## 2010-10-11 NOTE — Assessment & Plan Note (Signed)
His EF is 30 to 35%. This is where it has been for the last couple of years. He looks very well compensated and is not having symptoms. We will leave him on the little bit of ACE. Blood pressure has tended to be on the low side but ok for now. I have changed his Lasix and potassium to just prn. We will stop the metoprolol and just have him on Coreg. We will check follow up labs and then decide about the Mg supplementation. I will see him back in about 1 month. He is agreeable to weighing daily. His wife is happier with this plan. They will call if any problems develop in the interim.

## 2010-10-13 ENCOUNTER — Telehealth: Payer: Self-pay | Admitting: *Deleted

## 2010-10-13 NOTE — Telephone Encounter (Signed)
Notified wife of lab results. 

## 2010-10-13 NOTE — Telephone Encounter (Signed)
Message copied by Lorayne Bender on Thu Oct 13, 2010  4:04 PM ------      Message from: Rosalio Macadamia      Created: Tue Oct 11, 2010  4:56 PM       Ok to report. Labs are satisfactory. Would stay on the magnesium oxide for now. Sodium looks better.

## 2010-11-11 ENCOUNTER — Encounter: Payer: Self-pay | Admitting: Nurse Practitioner

## 2010-11-11 ENCOUNTER — Ambulatory Visit (INDEPENDENT_AMBULATORY_CARE_PROVIDER_SITE_OTHER): Payer: Medicare Other | Admitting: Nurse Practitioner

## 2010-11-11 ENCOUNTER — Ambulatory Visit: Payer: Medicare Other | Admitting: Nurse Practitioner

## 2010-11-11 VITALS — BP 100/60 | HR 72 | Resp 20 | Ht 73.0 in | Wt 202.0 lb

## 2010-11-11 DIAGNOSIS — I2589 Other forms of chronic ischemic heart disease: Secondary | ICD-10-CM

## 2010-11-11 DIAGNOSIS — I255 Ischemic cardiomyopathy: Secondary | ICD-10-CM

## 2010-11-11 DIAGNOSIS — I4891 Unspecified atrial fibrillation: Secondary | ICD-10-CM

## 2010-11-11 NOTE — Assessment & Plan Note (Signed)
I have stopped the Lisinopril. I think we need a higher blood pressure. I do not want him getting dizzy and falling. This is probably why he is coughing. We will continue the Coreg and use the Lasix prn. We will see him back in 3 months. Patient and his wife are agreeable to this plan and will call if any problems develop in the interim.

## 2010-11-11 NOTE — Patient Instructions (Signed)
Stop the Lisinopril. I think this will help let his blood pressure come up and he will be less dizzy. I think this will also make him stop coughing.   We will see you in 3 months.  Call for any problems.

## 2010-11-11 NOTE — Progress Notes (Signed)
Troy Freeman Date of Birth: 1920/04/23 Medical Record #[id number]  History of Present Illness: Troy Freeman is seen today for a one month check. He is seen for Dr. [place]. He has a history of remote anterior MI with remote CABG. EF is chronically reduced at 30 to 35%. He slipped off a rolling chair and fell and had a broken hip. He is back home. He is walking with the walker. He had Coreg, Lasix and Lisinopril added for his reduced EF during his hospital stay. We stopped his metoprolol (since he was also on Coreg) and changed the Lasix to just prn.   He comes back in today for follow up. His history is provided by the wife. He seems to be doing well. His wife notes that he gets dizzy with standing. Blood pressure remains low. He also has a dry hacky cough. She has had to rarely give him the Lasix. He continues to walk with the walker. No falls. INRs are checked by [facility].   Current Outpatient Prescriptions on File Prior to Visit  Medication Sig Dispense Refill  . Acetaminophen (TYLENOL 8 HOUR PO) Take 1,000 mg by mouth 2 (two) times daily.       . Calcium Carbonate-Vitamin D (CALCIUM + D PO) Take 600 mg by mouth daily. 3 TABS DAILY       . carvedilol (COREG) 6.25 MG tablet Take 6.25 mg by mouth 2 (two) times daily with a meal.        . furosemide (LASIX) 20 MG tablet Use prn for weight gain of 3 pounds in 24 hours. Take along with Potassium.      . magnesium oxide (MAG-OX) 400 MG tablet Take 400 mg by mouth 2 (two) times daily.        Marland Kitchen omeprazole (PRILOSEC) 20 MG capsule Take 20 mg by mouth daily.        . potassium chloride (MICRO-K) 10 MEQ CR capsule Take only on days that Lasix is taken.      . simvastatin (ZOCOR) 40 MG tablet Take 40 mg by mouth at bedtime.        . traMADol (ULTRAM) 50 MG tablet Take 50 mg by mouth every 6 (six) hours as needed.        . warfarin (COUMADIN) 1 MG tablet Take 1 mg by mouth as directed.          Allergies  Allergen Reactions  . Codeine     . Penicillins     Past Medical History  Diagnosis Date  . Palpitations   . Fluttering heart   . Bradycardia   . IHD (ischemic heart disease) July 2002    Large anterior MI with PCI to LAD with subsequent CABG x 6 in 2002  . LV dysfunction     EF is 30 to 35%  . MI, old     OLD ANTERIOR MI  . Atrial fibrillation     managed on coumadin and with rate control  . S/P CABG (coronary artery bypass graft) 2002  . Fall Aug 2012    with fractured right hip  . BPH (benign prostatic hyperplasia)   . GERD (gastroesophageal reflux disease)   . Dementia     mild  . Chronic anticoagulation     Past Surgical History  Procedure Date  . Coronary angioplasty 07/2000    LAD  . Coronary artery bypass graft 08/2000    LIMA to LAD, SVG to RCA and PD, SVG to  OM1 and OM2, and SVG to DX  . Right hip surgery 1996 & 2012  . Left hip surgery 2007    History  Smoking status  . Former Smoker  . Quit date: 01/09/1958  Smokeless tobacco  . Not on file    History  Alcohol Use No    Family History  Problem Relation Age of Onset  . Cancer Mother   . Heart disease Father     Review of Systems: The review of systems is positive for dementia, some orthostasis and a dry cough.  All other systems were reviewed and are negative.  Physical Exam: BP 100/60  Pulse 72  Resp 20  Ht 6\' 1"  (1.854 m)  Wt 202 lb (91.627 kg)  BMI 26.65 kg/m2 Patient is very pleasant and in no acute distress. Skin is warm and dry. Color is normal.  HEENT is unremarkable. Normocephalic/atraumatic. PERRL. Sclera are nonicteric. Neck is supple. No masses. No JVD. Lungs are clear. Cardiac exam shows an  irregular rhythm. His rate is controlled. Abdomen is soft. Extremities are without edema. Gait and ROM are intact. No gross neurologic deficits noted.   LABORATORY DATA:   Assessment / Plan:

## 2010-11-11 NOTE — Assessment & Plan Note (Signed)
This is chronic. Managed with coumadin and rate control.

## 2011-02-15 ENCOUNTER — Ambulatory Visit (INDEPENDENT_AMBULATORY_CARE_PROVIDER_SITE_OTHER): Payer: Medicare Other | Admitting: Cardiology

## 2011-02-15 ENCOUNTER — Encounter: Payer: Self-pay | Admitting: Cardiology

## 2011-02-15 VITALS — BP 130/80 | HR 64 | Ht 73.0 in | Wt 204.0 lb

## 2011-02-15 DIAGNOSIS — I519 Heart disease, unspecified: Secondary | ICD-10-CM

## 2011-02-15 DIAGNOSIS — I252 Old myocardial infarction: Secondary | ICD-10-CM

## 2011-02-15 DIAGNOSIS — I2510 Atherosclerotic heart disease of native coronary artery without angina pectoris: Secondary | ICD-10-CM

## 2011-02-15 DIAGNOSIS — I480 Paroxysmal atrial fibrillation: Secondary | ICD-10-CM

## 2011-02-15 DIAGNOSIS — I509 Heart failure, unspecified: Secondary | ICD-10-CM

## 2011-02-15 DIAGNOSIS — I4891 Unspecified atrial fibrillation: Secondary | ICD-10-CM

## 2011-02-15 NOTE — Progress Notes (Signed)
Troy Freeman Date of Birth: Jun 10, 1920 Medical Record #[id number]  History of Present Illness: Troy Freeman is seen today for a followup visit. He has a history of remote anterior MI with remote CABG. EF is chronically reduced at 30 to 35%. He slipped off a rolling chair and fell and had a broken hip. He is now walking with the walker.    His history is provided by the wife. He seems to be doing well. His orthostatic dizziness has improved. Since stopping his lisinopril his cough has resolved. He still gets a little bit lightheaded when he first gets up in the morning. He is now climbing stairs and does get short of breath with this. No falls. INRs are checked by [facility].   Current Outpatient Prescriptions on File Prior to Visit  Medication Sig Dispense Refill  . Acetaminophen (TYLENOL 8 HOUR PO) Take 1,000 mg by mouth 2 (two) times daily.       . Calcium Carbonate-Vitamin D (CALCIUM + D PO) Take 600 mg by mouth daily. 3 TABS DAILY       . carvedilol (COREG) 6.25 MG tablet Take 6.25 mg by mouth 2 (two) times daily with a meal.        . furosemide (LASIX) 20 MG tablet Use prn for weight gain of 3 pounds in 24 hours. Take along with Potassium.      . magnesium oxide (MAG-OX) 400 MG tablet Take 400 mg by mouth 2 (two) times daily.        Marland Kitchen omeprazole (PRILOSEC) 20 MG capsule Take 20 mg by mouth daily.        . potassium chloride (MICRO-K) 10 MEQ CR capsule Take only on days that Lasix is taken.      . simvastatin (ZOCOR) 40 MG tablet Take 40 mg by mouth at bedtime.        Marland Kitchen warfarin (COUMADIN) 1 MG tablet Take 1 mg by mouth as directed.          Allergies  Allergen Reactions  . Codeine   . Penicillins     Past Medical History  Diagnosis Date  . Bradycardia   . IHD (ischemic heart disease) July 2002    Large anterior MI with PCI to LAD with subsequent CABG x 6 in 2002  . LV dysfunction     EF is 30 to 35%  . MI, old     OLD ANTERIOR MI  . Atrial fibrillation    managed on coumadin and with rate control  . S/P CABG (coronary artery bypass graft) 2002  . Fall Aug 2012    with fractured right hip  . BPH (benign prostatic hyperplasia)   . GERD (gastroesophageal reflux disease)   . Dementia     mild  . Chronic anticoagulation     Past Surgical History  Procedure Date  . Coronary angioplasty 07/2000    LAD  . Coronary artery bypass graft 08/2000    LIMA to LAD, SVG to RCA and PD, SVG to OM1 and OM2, and SVG to DX  . Right hip surgery 1996 & 2012  . Left hip surgery 2007    History  Smoking status  . Former Smoker  . Quit date: 01/09/1958  Smokeless tobacco  . Not on file    History  Alcohol Use No    Family History  Problem Relation Age of Onset  . Cancer Mother   . Heart disease Father     Review of  Systems: The review of systems is positive for dementia and poor hearing.  All other systems were reviewed and are negative.  Physical Exam: BP 130/80  Pulse 64  Ht 6\' 1"  (1.854 m)  Wt 204 lb (92.534 kg)  BMI 26.91 kg/m2  SpO2 96% Patient is very pleasant and in no acute distress. Skin is warm and dry. Color is normal.  HEENT is unremarkable. Normocephalic/atraumatic. PERRL. Sclera are nonicteric. Neck is supple. No masses. No JVD. Lungs are clear. Cardiac exam shows an  irregular rhythm. His rate is controlled. Abdomen is soft. Extremities are without edema. Gait and ROM are intact. No gross neurologic deficits noted.   LABORATORY DATA:   Assessment / Plan:

## 2011-02-15 NOTE — Patient Instructions (Addendum)
Continue your current therapy.  Troy Freeman will see you again in 3 months.

## 2011-02-15 NOTE — Assessment & Plan Note (Signed)
He is on chronic anticoagulation. His rate appears to be well-controlled. He is asymptomatic.

## 2011-02-15 NOTE — Assessment & Plan Note (Signed)
He is actually feeling better with stopping his ACE inhibitor. His cough is resolved. He is less orthostatic. Continue with his current dose of carvedilol. He takes Lasix as needed for increased weight gain.

## 2011-03-09 ENCOUNTER — Telehealth: Payer: Self-pay | Admitting: Cardiology

## 2011-03-09 NOTE — Telephone Encounter (Signed)
New msg Pt's wife called and wanted to know could he get a sit down walker.  Please call her back

## 2011-03-10 NOTE — Telephone Encounter (Signed)
Patient's wife called okay with Dr.Jordan to get sit down walker.Written order left at 3rd floor front desk.

## 2011-03-27 ENCOUNTER — Telehealth: Payer: Self-pay | Admitting: Cardiology

## 2011-03-27 ENCOUNTER — Encounter (HOSPITAL_COMMUNITY): Payer: Self-pay | Admitting: *Deleted

## 2011-03-27 ENCOUNTER — Emergency Department (HOSPITAL_COMMUNITY): Payer: Medicare Other

## 2011-03-27 ENCOUNTER — Emergency Department (HOSPITAL_COMMUNITY)
Admission: EM | Admit: 2011-03-27 | Discharge: 2011-03-27 | Disposition: A | Discharge status: Home / Self Care | Payer: Medicare Other | Attending: Emergency Medicine | Admitting: Emergency Medicine

## 2011-03-27 DIAGNOSIS — Z951 Presence of aortocoronary bypass graft: Secondary | ICD-10-CM | POA: Insufficient documentation

## 2011-03-27 DIAGNOSIS — N400 Enlarged prostate without lower urinary tract symptoms: Secondary | ICD-10-CM | POA: Insufficient documentation

## 2011-03-27 DIAGNOSIS — I498 Other specified cardiac arrhythmias: Secondary | ICD-10-CM | POA: Insufficient documentation

## 2011-03-27 DIAGNOSIS — R0989 Other specified symptoms and signs involving the circulatory and respiratory systems: Secondary | ICD-10-CM | POA: Insufficient documentation

## 2011-03-27 DIAGNOSIS — I480 Paroxysmal atrial fibrillation: Secondary | ICD-10-CM | POA: Diagnosis present

## 2011-03-27 DIAGNOSIS — I252 Old myocardial infarction: Secondary | ICD-10-CM | POA: Insufficient documentation

## 2011-03-27 DIAGNOSIS — F068 Other specified mental disorders due to known physiological condition: Secondary | ICD-10-CM | POA: Insufficient documentation

## 2011-03-27 DIAGNOSIS — R079 Chest pain, unspecified: Secondary | ICD-10-CM | POA: Diagnosis present

## 2011-03-27 DIAGNOSIS — R0609 Other forms of dyspnea: Secondary | ICD-10-CM | POA: Insufficient documentation

## 2011-03-27 DIAGNOSIS — I4891 Unspecified atrial fibrillation: Secondary | ICD-10-CM | POA: Insufficient documentation

## 2011-03-27 LAB — DIFFERENTIAL
Basophils Relative: 1 % (ref 0–1)
Lymphocytes Relative: 18 % (ref 12–46)
Monocytes Absolute: 0.8 10*3/uL (ref 0.1–1.0)
Monocytes Relative: 13 % — ABNORMAL HIGH (ref 3–12)
Neutro Abs: 3.9 10*3/uL (ref 1.7–7.7)

## 2011-03-27 LAB — CBC
HCT: 37 % — ABNORMAL LOW (ref 39.0–52.0)
Hemoglobin: 13 g/dL (ref 13.0–17.0)
MCHC: 35.1 g/dL (ref 30.0–36.0)
MCV: 90.7 fL (ref 78.0–100.0)

## 2011-03-27 LAB — PROTIME-INR
INR: 2.79 — ABNORMAL HIGH (ref 0.00–1.49)
Prothrombin Time: 29.9 seconds — ABNORMAL HIGH (ref 11.6–15.2)

## 2011-03-27 LAB — COMPREHENSIVE METABOLIC PANEL
BUN: 15 mg/dL (ref 6–23)
CO2: 26 mEq/L (ref 19–32)
Chloride: 96 mEq/L (ref 96–112)
Creatinine, Ser: 0.94 mg/dL (ref 0.50–1.35)
GFR calc Af Amer: 83 mL/min — ABNORMAL LOW (ref >90)
GFR calc non Af Amer: 72 mL/min — ABNORMAL LOW (ref >90)
Total Bilirubin: 0.5 mg/dL (ref 0.3–1.2)

## 2011-03-27 MED ORDER — SODIUM CHLORIDE 0.9 % IV SOLN
Freq: Once | INTRAVENOUS | Status: AC
  Administered 2011-03-27: 125 mL/h via INTRAVENOUS

## 2011-03-27 NOTE — ED Provider Notes (Signed)
History     CSN: [id number]  Arrival date & time 03/27/11  1032   First MD Initiated Contact with Patient 03/27/11 1108      Chief Complaint  Patient presents with  . Chest Pain    (Consider location/radiation/quality/duration/timing/severity/associated sxs/prior treatment) Patient is a 78 y.o. male presenting with chest pain. The history is provided by the patient.  Chest Pain    patient here with chest pain lasting for between 4-5 seconds which started yesterday. Location of pain is at his left chest and described as sharp and nonradiating. Nothing makes symptoms better or worse. Symptoms have been nonexertional, but have been associated with dyspnea. No diaphoresis or vomiting. No recent cough or fever. No prior history of same. Called his cardiologist was told to come here for evaluation. No medications taken prior to arrival  Past Medical History  Diagnosis Date  . Bradycardia   . IHD (ischemic heart disease) July 2002    Large anterior MI with PCI to LAD with subsequent CABG x 6 in 2002  . LV dysfunction     EF is 30 to 35%  . MI, old     OLD ANTERIOR MI  . Atrial fibrillation     managed on coumadin and with rate control  . S/P CABG (coronary artery bypass graft) 2002  . Fall Aug 2012    with fractured right hip  . BPH (benign prostatic hyperplasia)   . GERD (gastroesophageal reflux disease)   . Dementia     mild  . Chronic anticoagulation     Past Surgical History  Procedure Date  . Coronary angioplasty 07/2000    LAD  . Coronary artery bypass graft 08/2000    LIMA to LAD, SVG to RCA and PD, SVG to OM1 and OM2, and SVG to DX  . Right hip surgery 1996 & 2012  . Left hip surgery 2007    Family History  Problem Relation Age of Onset  . Cancer Mother   . Heart disease Father     History  Substance Use Topics  . Smoking status: Former Smoker    Quit date: 01/09/1958  . Smokeless tobacco: Not on file  . Alcohol Use: No      Review of Systems    Cardiovascular: Positive for chest pain.  All other systems reviewed and are negative.    Allergies  Codeine and Penicillins  Home Medications   Current Outpatient Rx  Name Route Sig Dispense Refill  . TYLENOL 8 HOUR PO Oral Take 1,000 mg by mouth 2 (two) times daily.     Marland Kitchen CALCIUM + D PO Oral Take 600 mg by mouth daily. 3 TABS DAILY     . CARVEDILOL 6.25 MG PO TABS Oral Take 6.25 mg by mouth 2 (two) times daily with a meal.      . FUROSEMIDE 20 MG PO TABS  Use prn for weight gain of 3 pounds in 24 hours. Take along with Potassium.    Marland Kitchen MAGNESIUM OXIDE 400 MG PO TABS Oral Take 400 mg by mouth 2 (two) times daily.      Marland Kitchen OMEPRAZOLE 20 MG PO CPDR Oral Take 20 mg by mouth daily.      Marland Kitchen POTASSIUM CHLORIDE ER 10 MEQ PO CPCR Oral Take 10 mEq by mouth daily. Take only on days that Lasix is taken.    Marland Kitchen SIMVASTATIN 40 MG PO TABS Oral Take 40 mg by mouth at bedtime.      Barron Alvine  SODIUM 1 MG PO TABS Oral Take 4 mg by mouth daily.       BP 110/99  Pulse 62  Temp 98.7 F (37.1 C)  Resp 20  Ht 6\' 1"  (1.854 m)  Wt 195 lb (88.451 kg)  BMI 25.73 kg/m2  SpO2 97%  Physical Exam  Nursing note and vitals reviewed. Constitutional: He is oriented to person, place, and time. He appears well-developed and well-nourished.  Non-toxic appearance. No distress.  HENT:  Head: Normocephalic and atraumatic.  Eyes: Conjunctivae, EOM and lids are normal. Pupils are equal, round, and reactive to light.  Neck: Normal range of motion. Neck supple. No tracheal deviation present. No mass present.  Cardiovascular: Normal rate and normal heart sounds.  An irregular rhythm present. Exam reveals no gallop.   No murmur heard. Pulmonary/Chest: Effort normal and breath sounds normal. No stridor. No respiratory distress. He has no decreased breath sounds. He has no wheezes. He has no rhonchi. He has no rales.  Abdominal: Soft. Normal appearance and bowel sounds are normal. He exhibits no distension. There is no  tenderness. There is no rebound and no CVA tenderness.  Musculoskeletal: Normal range of motion. He exhibits no edema and no tenderness.  Neurological: He is alert and oriented to person, place, and time. He has normal strength. No cranial nerve deficit or sensory deficit. GCS eye subscore is 4. GCS verbal subscore is 5. GCS motor subscore is 6.  Skin: Skin is warm and dry. No abrasion and no rash noted.  Psychiatric: He has a normal mood and affect. His speech is normal and behavior is normal.    ED Course  Procedures (including critical care time)   Labs Reviewed  CBC  DIFFERENTIAL  COMPREHENSIVE METABOLIC PANEL  PROTIME-INR   No results found.   No diagnosis found.    MDM   Date: 03/27/2011  Rate: 63  Rhythm: atrial fibrillation  QRS Axis: normal  Intervals: normal  ST/T Wave abnormalities: nonspecific ST changes  Conduction Disutrbances:afib  Narrative Interpretation:   Old EKG Reviewed: unchanged    3:23 PM Pt to be admitted for obs      Toy Baker, MD 03/27/11 1523

## 2011-03-27 NOTE — Consult Note (Addendum)
History and Physical  Patient ID: Troy Freeman Patient ID: Troy Freeman MRN: [id number], DOB/AGE: 76-07-1920 76 y.o. Date of Encounter: 03/27/2011  Primary Physician: No primary provider on file. Primary Cardiologist: PJ   Chief Complaint: Chest Pain  HPI: Mr Troy Freeman is an 7 year jold male with a history of CAD/LVD. He mentioned left-sided chest pain to his wife last pm. At that time, he did not wish to seek medical care. He was able to sleep. This am, pt continued to have chest pain. His wife called the office and brought him to the ER as requested. The pain is described as sharp, sticking pain like a needle in his chest. It is brief and resolves without intervention. He has had symptoms with exertion but it also occurs at rest. It is a low level, approximately 3/10 and there are no associated symptoms and no radiation.  Since in the ER, his chest pain has resolved. He is currently pain-free.  He does not remember ever having this pain before. He has no clear history of angina, even with an MI. He has gained some weight gradually over the last year or so, but no LE edema. He denies orthopnea or PND. His activity level is chronically low since his hip fx and he walks with a walker when he leaves the house but has no history of chest pain with exertion. He has chronic DOE and gets SOB going up the steps but this is chronic and has not changed recently.  Past Medical History  Diagnosis Date  . Bradycardia   . IHD (ischemic heart disease) July 2002    Large anterior MI with PCI to LAD with subsequent CABG x 6 in 2002  . LV dysfunction     EF is 30 to 35%  . MI, old     OLD ANTERIOR MI  . Atrial fibrillation     managed on coumadin and with rate control  . S/P CABG (coronary artery bypass graft) 2002  . Fall Aug 2012    with fractured right hip  . BPH (benign prostatic hyperplasia)   . GERD (gastroesophageal reflux disease)   . Dementia     mild  . Chronic  anticoagulation      Surgical History:  Past Surgical History  Procedure Date  . Coronary angioplasty 07/2000    LAD  . Coronary artery bypass graft 08/2000    LIMA to LAD, SVG to RCA and PD, SVG to OM1 and OM2, and SVG to DX  . Right hip surgery 1996 & 2012  . Left hip surgery 2007     I have reviewed the patient's current medications. Prior to Admission:  Medications Prior to Admission  Medication Dose Route Frequency Provider Last Rate Last Dose  . 0.9 %  sodium chloride infusion   Intravenous Once Toy Baker, MD 125 mL/hr at 03/27/11 1120 125 mL/hr at 03/27/11 1120   Medications Prior to Admission  Medication Sig Dispense Refill  . Acetaminophen (TYLENOL 8 HOUR PO) Take 1,000 mg by mouth 2 (two) times daily.       . Calcium Carbonate-Vitamin D (CALCIUM + D PO) Take 600 mg by mouth daily. 3 TABS DAILY       . carvedilol (COREG) 6.25 MG tablet Take 6.25 mg by mouth 2 (two) times daily with a meal.        . furosemide (LASIX) 20 MG tablet Use prn for weight gain of 3 pounds in 24 hours. Take  along with Potassium.      . magnesium oxide (MAG-OX) 400 MG tablet Take 400 mg by mouth 2 (two) times daily.        Marland Kitchen omeprazole (PRILOSEC) 20 MG capsule Take 20 mg by mouth daily.        . potassium chloride (MICRO-K) 10 MEQ CR capsule Take 10 mEq by mouth daily. Take only on days that Lasix is taken.      . simvastatin (ZOCOR) 40 MG tablet Take 40 mg by mouth at bedtime.        Marland Kitchen warfarin (COUMADIN) 1 MG tablet Take 4 mg by mouth daily.       Marland Kitchen DISCONTD: potassium chloride (MICRO-K) 10 MEQ CR capsule Take only on days that Lasix is taken.       Allergies:  Allergies  Allergen Reactions  . Codeine Nausea And Vomiting  . Penicillins Rash    History   Social History  . Marital Status: Married 65 years    Spouse Name: N/A    Number of Children: N/A  . Years of Education: N/A   Occupational History  . Retired Metallurgist   Social History Main Topics  . Smoking status:  Former Smoker    Quit date: 01/09/1958  . Smokeless tobacco: Not on file  . Alcohol Use: No  . Drug Use: No  . Sexually Active: No     Family History - both parents deceased, a son died of a heart attack  Problem Relation Age of Onset  . Cancer Mother   . Heart disease Father     Review of Systems: He has chronic DOE, no LE edema. Weight is controlled by prn Lasix, which he gets most days. He has multiple MS aches/pains secondary to arthritis. No ongoing GI symptoms. His memory is poor and he has dementia but his wife is able to help him and he is compliant with meds. No recent illnesses, fevers or chills. Full 14-point review of systems otherwise negative except as noted above.   Physical Exam: Blood pressure 110/99, pulse 62, temperature 98.7 F (37.1 C), resp. rate 20, height 6\' 1"  (1.854 m), weight 195 lb (88.451 kg), SpO2 97.00%. General: Well developed, well nourished, elderly male in no acute distress. Head: Normocephalic, atraumatic, sclera non-icteric, no xanthomas, nares are without discharge. Dentition: OK Neck: No carotid bruits. JVD minimally elevated. No thyromegally Lungs: Good expansion bilaterally, Clear bilaterally to auscultation without wheezes or rhonchi. Rales in bases. Heart: Irregular rate and rhythm with S1 S2. No S3 or S4.  No murmurs, no rubs, or gallops appreciated. Abdomen: Soft, non-tender, non-distended with normoactive bowel sounds. No hepatomegaly. No rebound/guarding. No obvious abdominal masses. Msk:  Strength and tone appear normal for age. No joint effusions, some joint deformities of DIP joints in hands and both knees, no spine or costo-vertebral angle tenderness. Extremities: No clubbing or cyanosis. No edema.  Distal pedal pulses are 2+ and equal bilaterally. Neuro: Alert and oriented X 3. Moves all extremities spontaneously. No focal deficits noted. Psych:  Responds to questions appropriately with a normal affect. Skin: No rashes or lesions  noted  Labs:   Lab Results  Component Value Date   WBC 5.9 03/27/2011   HGB 13.0 03/27/2011   HCT 37.0* 03/27/2011   MCV 90.7 03/27/2011   PLT 259 03/27/2011    Basename 03/27/11 1113  INR 2.79*    Lab 03/27/11 1113  NA 132*  K 4.3  CL 96  CO2 26  BUN  15  CREATININE 0.94  CALCIUM 9.4  PROT 6.9  BILITOT 0.5  ALKPHOS 44  ALT 14  AST 25  GLUCOSE 99   POC Troponin - 0.01  Lab Results  Component Value Date   CHOL 145 08/21/2010   HDL 52 08/21/2010   LDLCALC 76 08/21/2010   TRIG 85 08/21/2010   No results found for this basename: probnp   Lab Results  Component Value Date   DDIMER 0.37 03/27/2011    Radiology/Studies:  Dg Chest 2 View 03/27/2011  *RADIOLOGY REPORT*  Clinical Data: Chest pain.  Prior heart surgery.  CHEST - 2 VIEW  Comparison: 12/18/2009  Findings: Mild hyperinflation. Prior median sternotomy.  Mild artifact degradation posteriorly on the lateral view.  Numerous leads and wires project over the chest.  Midline trachea. Moderate cardiomegaly with tortuous descending thoracic aorta. No pleural effusion or pneumothorax.  Biapical pleural thickening. Bibasilar scarring.  IMPRESSION: Hyperinflation and cardiomegaly. No acute superimposed process.  Original Report Authenticated By: Consuello Bossier, M.D.     Echo: 09/14/2008 Study Conclusions 1. Left ventricle: The cavity size was normal. Wall thickness was normal. Systolic function was moderately to severely reduced. The estimated ejection fraction was in the range of 30% to 35%. Akinesis of the anteroseptal myocardium. Dyskinesis of the apical myocardium. Doppler parameters are consistent with abnormal left ventricular relaxation (grade 1 diastolic dysfunction). 2. Aortic valve: Mild regurgitation.  EKG: atrial fib, no acute ischemic changes.  ASSESSMENT AND PLAN:  Principal Problem:  *Chest pain - atypical and initial enzymes are negative. MD discuss with pt/wife and advise on overnight admission vs recheck  enzymes and d/c, to have early f/u with PJ. Orthostatic VS were negative. Active Problems:  PAF (paroxysmal atrial fibrillation) - rate OK on current Rx. HR may be a bit low, but he is tolerating a BB.  His coumadin is therapeutic. ICM: His last echo was 2 years ago. He is on Lasix and a BB. Was on ACE in the past but did not tolerate it secondary to cough.  Otherwise, stable, continue home Rx.   Signed,  Bjorn Loser Barrett PA-C 03/27/2011, 1:32 PM  Attending Note:   The patient was seen and examined.  Agree with assessment and plan as noted above.  Mr. Fortin is a pleasantly demented gentleman with a remote hx of CAD.  He presented today with a brief episode of pins and needles chest discomfort.  The pain lasted only for a few seconds.  Cardiac evaluation here at [facility] ER is unremarkable.  He has been pain free since he arrived  Cardiac enzymes are unremarkable.  ECG shows a previous Anterior MI but is unchanged from previous tracings.   I discussed the issue of admission with the wife.  She agrees that he will be more comfortable if he can go home.  His CP was very atypical and is not consistent with angina.    He has A-fib and is therapeutic on his coumadin.  She will call us back if he has any further pain.  He can see Dr. Swaziland in several weeks. Vesta Mixer, Montez Hageman., MD, [facility] 03/27/2011, 3:41 PM  Pt has appt with Norma Fredrickson, NP for Dr Swaziland tomorrow 3/19 at 11:30. Will just have him keep this appt unless they wish to f/u with Dr Swaziland further out. Bjorn Loser Barrett

## 2011-03-27 NOTE — H&P (Deleted)
History and Physical  Patient ID: Troy Freeman Patient ID: NYSIR FERGUSSON MRN: [id number], DOB/AGE: 09-23-1920 78 y.o. Date of Encounter: 03/27/2011  Primary Physician: No primary provider on file. Primary Cardiologist: PJ   Chief Complaint: Chest Pain  HPI: [name] is an 78 year jold male with a history of CAD/LVD. He mentioned left-sided chest pain to his wife last pm. At that time, he did not wish to seek medical care. He was able to sleep. This am, pt continued to have chest pain. His wife called the office and brought him to the ER as requested. The pain is described as sharp, sticking pain like a needle in his chest. It is brief and resolves without intervention. He has had symptoms with exertion but it also occurs at rest. It is a low level, approximately 3/10 and there are no associated symptoms and no radiation.  Since in the ER, his chest pain has resolved. He is currently pain-free.  He does not remember ever having this pain before. He has no clear history of angina, even with an MI. He has gained some weight gradually over the last year or so, but no LE edema. He denies orthopnea or PND. His activity level is chronically low since his hip fx and he walks with a walker when he leaves the house but has no history of chest pain with exertion. He has chronic DOE and gets SOB going up the steps but this is chronic and has not changed recently.  Past Medical History  Diagnosis Date  . Bradycardia   . IHD (ischemic heart disease) July 2002    Large anterior MI with PCI to LAD with subsequent CABG x 6 in 2002  . LV dysfunction     EF is 30 to 35%  . MI, old     OLD ANTERIOR MI  . Atrial fibrillation     managed on coumadin and with rate control  . S/P CABG (coronary artery bypass graft) 2002  . Fall Aug 2012    with fractured right hip  . BPH (benign prostatic hyperplasia)   . GERD (gastroesophageal reflux disease)   . Dementia     mild  . Chronic  anticoagulation      Surgical History:  Past Surgical History  Procedure Date  . Coronary angioplasty 07/2000    LAD  . Coronary artery bypass graft 08/2000    LIMA to LAD, SVG to RCA and PD, SVG to OM1 and OM2, and SVG to DX  . Right hip surgery 1996 & 2012  . Left hip surgery 2007     I have reviewed the patient's current medications. Prior to Admission:  Medications Prior to Admission  Medication Dose Route Frequency Provider Last Rate Last Dose  . 0.9 %  sodium chloride infusion   Intravenous Once Toy Baker, MD 125 mL/hr at 03/27/11 1120 125 mL/hr at 03/27/11 1120   Medications Prior to Admission  Medication Sig Dispense Refill  . Acetaminophen (TYLENOL 8 HOUR PO) Take 1,000 mg by mouth 2 (two) times daily.       . Calcium Carbonate-Vitamin D (CALCIUM + D PO) Take 600 mg by mouth daily. 3 TABS DAILY       . carvedilol (COREG) 6.25 MG tablet Take 6.25 mg by mouth 2 (two) times daily with a meal.        . furosemide (LASIX) 20 MG tablet Use prn for weight gain of 3 pounds in 24 hours. Take  along with Potassium.      . magnesium oxide (MAG-OX) 400 MG tablet Take 400 mg by mouth 2 (two) times daily.        Marland Kitchen omeprazole (PRILOSEC) 20 MG capsule Take 20 mg by mouth daily.        . potassium chloride (MICRO-K) 10 MEQ CR capsule Take 10 mEq by mouth daily. Take only on days that Lasix is taken.      . simvastatin (ZOCOR) 40 MG tablet Take 40 mg by mouth at bedtime.        Marland Kitchen warfarin (COUMADIN) 1 MG tablet Take 4 mg by mouth daily.       Marland Kitchen DISCONTD: potassium chloride (MICRO-K) 10 MEQ CR capsule Take only on days that Lasix is taken.       Allergies:  Allergies  Allergen Reactions  . Codeine Nausea And Vomiting  . Penicillins Rash    History   Social History  . Marital Status: Married 76 years    Spouse Name: N/A    Number of Children: N/A  . Years of Education: N/A   Occupational History  . Retired Metallurgist   Social History Main Topics  . Smoking status:  Former Smoker    Quit date: 01/09/1958  . Smokeless tobacco: Not on file  . Alcohol Use: No  . Drug Use: No  . Sexually Active: No     Family History - both parents deceased, a son died of a heart attack  Problem Relation Age of Onset  . Cancer Mother   . Heart disease Father     Review of Systems: He has chronic DOE, no LE edema. Weight is controlled by prn Lasix, which he gets most days. He has multiple MS aches/pains secondary to arthritis. No ongoing GI symptoms. His memory is poor and he has dementia but his wife is able to help him and he is compliant with meds. No recent illnesses, fevers or chills. Full 14-point review of systems otherwise negative except as noted above.   Physical Exam: Blood pressure 110/99, pulse 62, temperature 98.7 F (37.1 C), resp. rate 20, height 6\' 1"  (1.854 m), weight 195 lb (88.451 kg), SpO2 97.00%. General: Well developed, well nourished, elderly male in no acute distress. Head: Normocephalic, atraumatic, sclera non-icteric, no xanthomas, nares are without discharge. Dentition: OK Neck: No carotid bruits. JVD minimally elevated. No thyromegally Lungs: Good expansion bilaterally, Clear bilaterally to auscultation without wheezes or rhonchi. Rales in bases. Heart: Irregular rate and rhythm with S1 S2. No S3 or S4.  No murmurs, no rubs, or gallops appreciated. Abdomen: Soft, non-tender, non-distended with normoactive bowel sounds. No hepatomegaly. No rebound/guarding. No obvious abdominal masses. Msk:  Strength and tone appear normal for age. No joint effusions, some joint deformities of DIP joints in hands and both knees, no spine or costo-vertebral angle tenderness. Extremities: No clubbing or cyanosis. No edema.  Distal pedal pulses are 2+ and equal bilaterally. Neuro: Alert and oriented X 3. Moves all extremities spontaneously. No focal deficits noted. Psych:  Responds to questions appropriately with a normal affect. Skin: No rashes or lesions  noted  Labs:   Lab Results  Component Value Date   WBC 5.9 03/27/2011   HGB 13.0 03/27/2011   HCT 37.0* 03/27/2011   MCV 90.7 03/27/2011   PLT 259 03/27/2011    Basename 03/27/11 1113  INR 2.79*    Lab 03/27/11 1113  NA 132*  K 4.3  CL 96  CO2 26  BUN  15  CREATININE 0.94  CALCIUM 9.4  PROT 6.9  BILITOT 0.5  ALKPHOS 44  ALT 14  AST 25  GLUCOSE 99   POC Troponin - 0.01  Lab Results  Component Value Date   CHOL 145 08/21/2010   HDL 52 08/21/2010   LDLCALC 76 08/21/2010   TRIG 85 08/21/2010   No results found for this basename: probnp   Lab Results  Component Value Date   DDIMER 0.37 03/27/2011    Radiology/Studies:  Dg Chest 2 View 03/27/2011  *RADIOLOGY REPORT*  Clinical Data: Chest pain.  Prior heart surgery.  CHEST - 2 VIEW  Comparison: 12/18/2009  Findings: Mild hyperinflation. Prior median sternotomy.  Mild artifact degradation posteriorly on the lateral view.  Numerous leads and wires project over the chest.  Midline trachea. Moderate cardiomegaly with tortuous descending thoracic aorta. No pleural effusion or pneumothorax.  Biapical pleural thickening. Bibasilar scarring.  IMPRESSION: Hyperinflation and cardiomegaly. No acute superimposed process.  Original Report Authenticated By: Consuello Bossier, M.D.     Echo: 09/14/2008 Study Conclusions 1. Left ventricle: The cavity size was normal. Wall thickness was normal. Systolic function was moderately to severely reduced. The estimated ejection fraction was in the range of 30% to 35%. Akinesis of the anteroseptal myocardium. Dyskinesis of the apical myocardium. Doppler parameters are consistent with abnormal left ventricular relaxation (grade 1 diastolic dysfunction). 2. Aortic valve: Mild regurgitation.  EKG: atrial fib, no acute ischemic changes.  ASSESSMENT AND PLAN:  Principal Problem:  *Chest pain - atypical and initial enzymes are negative. MD discuss with pt/wife and advise on overnight admission vs recheck  enzymes and d/c, to have early f/u with PJ. Orthostatic VS were negative. Active Problems:  PAF (paroxysmal atrial fibrillation) - rate OK on current Rx. HR may be a bit low, but he is tolerating a BB.  His coumadin is therapeutic. ICM: His last echo was 2 years ago. He is on Lasix and a BB. Was on ACE in the past but did not tolerate it secondary to cough.  Otherwise, stable, continue home Rx.   Signed,  Rhonda Barrett PA-C 03/27/2011, 1:32 PM

## 2011-03-27 NOTE — ED Notes (Signed)
Pt states he started to have chest pain yesterday afternoon. Pt states he started to have slight chest pain yesterday. Pt states pain comes and goes. Pt c/o sob with excertion. Pt denies any n/v

## 2011-03-27 NOTE — ED Notes (Signed)
Pt aware he is awaiting cardiology to come and see him for further plan of care. Pt has wife at bedside. Pt in no apparent distress

## 2011-03-27 NOTE — ED Notes (Signed)
Pt. Waiting for consulting cardiologist.

## 2011-03-27 NOTE — Telephone Encounter (Signed)
Chest pain since Sunday afternoon, off and on, requesting appt today

## 2011-03-27 NOTE — Telephone Encounter (Signed)
Spoke with wife and patient has been having chest pains off and on since yesterday.  Has no NTG on hand and known CAD, CHF, and old MI.  Discussed with Lawson Fiscal NP and advised wife he needed to go to emergency room.  Wife verbalized understanding

## 2011-03-27 NOTE — ED Notes (Signed)
Pt reports he noted sharp pin prick like chest pain to left chest intermittent starting yesterday while at rest. Pt reports pain is brief. Pt denies shortness of breath except with exertion. Pt also reporting numbness and tingling to left finger tips. Pt denies n/v.  Pt denies chest pressure and pt currently denies any chest pain. Pt talked to his cardiologist and was told to come to ED for further evaluation.  Pt has history of MI 10 years ago.

## 2011-03-27 NOTE — ED Notes (Signed)
Pt. Seen by cardiology and okayed for discharge.  Waiting for discharge instructions from ED MD

## 2011-03-27 NOTE — ED Provider Notes (Signed)
Pt not seen by me, asked to do discharge, have read cardiology's note.   Ward Givens, MD 03/27/11 1659

## 2011-03-28 ENCOUNTER — Ambulatory Visit: Payer: Medicare Other | Admitting: Nurse Practitioner

## 2011-04-14 ENCOUNTER — Ambulatory Visit: Payer: Medicare Other | Admitting: Nurse Practitioner

## 2011-05-18 ENCOUNTER — Ambulatory Visit (INDEPENDENT_AMBULATORY_CARE_PROVIDER_SITE_OTHER): Payer: Medicare Other | Admitting: Nurse Practitioner

## 2011-05-18 ENCOUNTER — Encounter: Payer: Self-pay | Admitting: Nurse Practitioner

## 2011-05-18 VITALS — BP 120/78 | HR 54 | Ht 73.0 in | Wt 204.0 lb

## 2011-05-18 DIAGNOSIS — I4891 Unspecified atrial fibrillation: Secondary | ICD-10-CM

## 2011-05-18 DIAGNOSIS — R079 Chest pain, unspecified: Secondary | ICD-10-CM

## 2011-05-18 DIAGNOSIS — I255 Ischemic cardiomyopathy: Secondary | ICD-10-CM

## 2011-05-18 DIAGNOSIS — I480 Paroxysmal atrial fibrillation: Secondary | ICD-10-CM

## 2011-05-18 DIAGNOSIS — I2589 Other forms of chronic ischemic heart disease: Secondary | ICD-10-CM

## 2011-05-18 NOTE — Assessment & Plan Note (Signed)
He seems to be holding his own. Appears compensated on exam. Will continue with his current regimen. We will see him back in about 4 months.

## 2011-05-18 NOTE — Progress Notes (Signed)
[name] Date of Birth: Mar 10, 1920 Medical Record #[id number]  History of Present Illness: Troy Freeman is seen back today for a follow up visit. It is a 3 month check. He is seen for Dr. Swaziland. He is 78 years old. Has known CAD with LV dysfunction. Has progressive dementia. His wife stays with him around the clock. He does have atrial fib and is on coumadin.   He comes in today. His wife provides the history. Troy Freeman seems to be doing ok. Remains very hard of hearing. No falls reported. He is using his walker. He has become more sedentary. He was seen in the ER back in March with some pins and needle like chest pain. His evaluation was benign. He continues to be managed conservatively.   Current Outpatient Prescriptions on File Prior to Visit  Medication Sig Dispense Refill  . Acetaminophen (TYLENOL 8 HOUR PO) Take 1,000 mg by mouth 2 (two) times daily.       . Calcium Carbonate-Vitamin D (CALCIUM + D PO) Take 600 mg by mouth daily. 3 TABS DAILY       . carvedilol (COREG) 6.25 MG tablet Take 6.25 mg by mouth 2 (two) times daily with a meal.        . furosemide (LASIX) 20 MG tablet Use prn for weight gain of 3 pounds in 24 hours. Take along with Potassium.      . magnesium oxide (MAG-OX) 400 MG tablet Take 400 mg by mouth 2 (two) times daily.        Marland Kitchen omeprazole (PRILOSEC) 20 MG capsule Take 20 mg by mouth daily.        . potassium chloride (MICRO-K) 10 MEQ CR capsule Take 10 mEq by mouth daily. Take only on days that Lasix is taken.      . simvastatin (ZOCOR) 40 MG tablet Take 40 mg by mouth at bedtime.        Marland Kitchen warfarin (COUMADIN) 1 MG tablet Take 4 mg by mouth daily.         Allergies  Allergen Reactions  . Codeine Nausea And Vomiting  . Penicillins Rash    Past Medical History  Diagnosis Date  . Bradycardia   . IHD (ischemic heart disease) July 2002    Large anterior MI with PCI to LAD with subsequent CABG x 6 in 2002  . LV dysfunction     EF is 30 to 35%; does not  tolerate ACE due to cough and dizziness  . MI, old     OLD ANTERIOR MI  . Atrial fibrillation     managed on coumadin and with rate control  . S/P CABG (coronary artery bypass graft) 2002  . Fall Aug 2012    with fractured right hip  . BPH (benign prostatic hyperplasia)   . GERD (gastroesophageal reflux disease)   . Dementia     mild  . Chronic anticoagulation     Past Surgical History  Procedure Date  . Coronary angioplasty 07/2000    LAD  . Coronary artery bypass graft 08/2000    LIMA to LAD, SVG to RCA and PD, SVG to OM1 and OM2, and SVG to DX  . Right hip surgery 1996 & 2012  . Left hip surgery 2007    History  Smoking status  . Former Smoker  . Quit date: 01/09/1958  Smokeless tobacco  . Not on file    History  Alcohol Use No    Family History  Problem Relation Age  of Onset  . Cancer Mother   . Heart disease Father     Review of Systems: The review of systems is positive for decreased hearing. No chest pain. Says he is not short of breath.  All other systems were reviewed and are negative.  Physical Exam: BP 120/78  Pulse 54  Ht 6\' 1"  (1.854 m)  Wt 204 lb (92.534 kg)  BMI 26.91 kg/m2 Patient is very pleasant and in no acute distress. He is quite hard of hearing. Skin is warm and dry. Color is normal.  HEENT is unremarkable. Normocephalic/atraumatic. PERRL. Sclera are nonicteric. Neck is supple. No masses. No JVD. Lungs are clear. Cardiac exam shows a fairly regular rate and rhythm today. Abdomen is soft. Extremities are without edema. Gait and ROM are intact. He is using a walker. No gross neurologic deficits noted.   LABORATORY DATA:  Lab Results  Component Value Date   WBC 5.9 03/27/2011   HGB 13.0 03/27/2011   HCT 37.0* 03/27/2011   PLT 259 03/27/2011   GLUCOSE 99 03/27/2011   CHOL 145 08/21/2010   TRIG 85 08/21/2010   HDL 52 08/21/2010   LDLCALC 76 08/21/2010   ALT 14 03/27/2011   AST 25 03/27/2011   NA 132* 03/27/2011   K 4.3 03/27/2011   CL 96  03/27/2011   CREATININE 0.94 03/27/2011   BUN 15 03/27/2011   CO2 26 03/27/2011   TSH 3.488 08/21/2010   INR 2.79* 03/27/2011    Assessment / Plan:

## 2011-05-18 NOTE — Patient Instructions (Signed)
I think you are doing well.  We will see you in 4 months.  Call the [facility] office at [phone] if you have any questions, problems or concerns.

## 2011-05-18 NOTE — Assessment & Plan Note (Signed)
He remains on Coumadin with rate control. No recurrent falls.

## 2011-05-18 NOTE — Assessment & Plan Note (Signed)
He had some atypical chest pain back in March. Negative ER evaluation. Will continue to manage conservatively.

## 2011-09-20 ENCOUNTER — Encounter: Payer: Self-pay | Admitting: Nurse Practitioner

## 2011-09-20 ENCOUNTER — Ambulatory Visit (INDEPENDENT_AMBULATORY_CARE_PROVIDER_SITE_OTHER): Payer: Medicare Other | Admitting: Nurse Practitioner

## 2011-09-20 VITALS — BP 120/66 | HR 45 | Ht 73.0 in | Wt 203.0 lb

## 2011-09-20 DIAGNOSIS — I498 Other specified cardiac arrhythmias: Secondary | ICD-10-CM

## 2011-09-20 DIAGNOSIS — I519 Heart disease, unspecified: Secondary | ICD-10-CM

## 2011-09-20 DIAGNOSIS — R001 Bradycardia, unspecified: Secondary | ICD-10-CM

## 2011-09-20 DIAGNOSIS — I259 Chronic ischemic heart disease, unspecified: Secondary | ICD-10-CM

## 2011-09-20 LAB — CBC WITH DIFFERENTIAL/PLATELET
Basophils Absolute: 0 10*3/uL (ref 0.0–0.1)
Basophils Relative: 0.5 % (ref 0.0–3.0)
Eosinophils Absolute: 0.1 10*3/uL (ref 0.0–0.7)
Eosinophils Relative: 1.8 % (ref 0.0–5.0)
HCT: 40.7 % (ref 39.0–52.0)
Hemoglobin: 13.6 g/dL (ref 13.0–17.0)
Lymphocytes Relative: 19.4 % (ref 12.0–46.0)
Lymphs Abs: 1.2 10*3/uL (ref 0.7–4.0)
MCHC: 33.4 g/dL (ref 30.0–36.0)
MCV: 94.4 fl (ref 78.0–100.0)
Monocytes Absolute: 0.7 10*3/uL (ref 0.1–1.0)
Monocytes Relative: 11.1 % (ref 3.0–12.0)
Neutro Abs: 4.2 10*3/uL (ref 1.4–7.7)
Neutrophils Relative %: 67.2 % (ref 43.0–77.0)
Platelets: 252 10*3/uL (ref 150.0–400.0)
RBC: 4.31 Mil/uL (ref 4.22–5.81)
RDW: 13.4 % (ref 11.5–14.6)
WBC: 6.2 10*3/uL (ref 4.5–10.5)

## 2011-09-20 MED ORDER — CARVEDILOL 6.25 MG PO TABS: 3.1250 mg | ORAL_TABLET | Freq: Two times a day (BID) | ORAL | Status: DC

## 2011-09-20 NOTE — Progress Notes (Signed)
[name] Date of Birth: [birth year] Medical Record #[id number]  History of Present Illness: Troy Freeman is seen today for his 4 month check. He is seen for Dr. Swaziland. He is now 71. He has known CAD with LV dysfunction, chronic atrial fib, coumadin therapy and progressive dementia. He is managed conservatively. His wife provides around the clock care.   He comes in today. He is here with his wife. She provides most of the history. He seems to be doing ok. Does tell her that he is dizzy on occasion. No falls. Still going up the steps to his bedroom and down the steps to his basement. No falls fortunately. No chest pain. Notes his fingers feel a little "prickly" at times. Overall, he seems to be holding his own. She is only giving him the Mag ox once a day.   Current Outpatient Prescriptions on File Prior to Visit  Medication Sig Dispense Refill  . Acetaminophen (TYLENOL 8 HOUR PO) Take 1,000 mg by mouth 2 (two) times daily.       . Calcium Carbonate-Vitamin D (CALCIUM + D PO) Take 600 mg by mouth daily. 3 TABS DAILY       . carvedilol (COREG) 6.25 MG tablet Take 6.25 mg by mouth 2 (two) times daily with a meal.        . furosemide (LASIX) 20 MG tablet Use prn for weight gain of 3 pounds in 24 hours. Take along with Potassium.      . magnesium oxide (MAG-OX) 400 MG tablet Take 400 mg by mouth 2 (two) times daily.        Marland Kitchen omeprazole (PRILOSEC) 20 MG capsule Take 20 mg by mouth daily.        . potassium chloride (MICRO-K) 10 MEQ CR capsule Take 10 mEq by mouth daily. Take only on days that Lasix is taken.      . simvastatin (ZOCOR) 40 MG tablet Take 40 mg by mouth at bedtime.        Marland Kitchen warfarin (COUMADIN) 1 MG tablet Take 4 mg by mouth daily.         Allergies  Allergen Reactions  . Codeine Nausea And Vomiting  . Penicillins Rash    Past Medical History  Diagnosis Date  . Bradycardia   . IHD (ischemic heart disease) July 2002    Large anterior MI with PCI to LAD with subsequent  CABG x 6 in 2002  . LV dysfunction     EF is 30 to 35%; does not tolerate ACE due to cough and dizziness  . MI, old     OLD ANTERIOR MI  . Campath-induced atrial fibrillation     managed on coumadin and with rate control  . S/P CABG (coronary artery bypass graft) 2002  . Fall Aug 2012    with fractured right hip  . BPH (benign prostatic hyperplasia)   . GERD (gastroesophageal reflux disease)   . Dementia     mild  . Chronic anticoagulation     Past Surgical History  Procedure Date  . Coronary angioplasty 07/2000    LAD  . Coronary artery bypass graft 08/2000    LIMA to LAD, SVG to RCA and PD, SVG to OM1 and OM2, and SVG to DX  . Right hip surgery 1996 & 2012  . Left hip surgery 2007    History  Smoking status  . Former Smoker  . Quit date: 01/09/1958  Smokeless tobacco  . Not on file  History  Alcohol Use No    Family History  Problem Relation Age of Onset  . Cancer Mother   . Heart disease Father     Review of Systems: The review of systems is per the HPI.  All other systems were reviewed and are negative.  Physical Exam: There were no vitals taken for this visit. Patient is an elderly male who is very pleasant and in no acute distress. He is hard of hearing. Skin is warm and dry. Color is normal.  HEENT is unremarkable. Normocephalic/atraumatic. PERRL. Sclera are nonicteric. Neck is supple. No masses. No JVD. Lungs are clear. Cardiac exam shows an irregular rhythm. Rate is controlled. Abdomen is soft. Extremities are without edema. Gait and ROM are intact. No gross neurologic deficits noted.ir   LABORATORY DATA: EKG today shows atrial fib with a slow ventricular response. Rate is 45 today.   Lab Results  Component Value Date   WBC 5.9 03/27/2011   HGB 13.0 03/27/2011   HCT 37.0* 03/27/2011   PLT 259 03/27/2011   GLUCOSE 99 03/27/2011   CHOL 145 08/21/2010   TRIG 85 08/21/2010   HDL 52 08/21/2010   LDLCALC 76 08/21/2010   ALT 14 03/27/2011   AST 25 03/27/2011     NA 132* 03/27/2011   K 4.3 03/27/2011   CL 96 03/27/2011   CREATININE 0.94 03/27/2011   BUN 15 03/27/2011   CO2 26 03/27/2011   TSH 3.488 08/21/2010   INR 2.79* 03/27/2011     Assessment / Plan:  1. CAD - no chest pain. Seems to be holding his own.   2. LV dysfunction - looks compensated.   3. Progressive dementia  4. Atrial fib - he is more bradycardic today. Will cut the Coreg back to just 3.125 mg BID. I will see him back in about one month with a repeat EKG.   Overall, he seems to be holding his own and except for the bradycardia, we will continue with his current regimen. The Coreg is cut back today. We will check labs today and try to discontinue medicines if possible. Safety is the overall concern. Will see him back in 1 month. Coumadin is monitored thru [facility].   Patient and his wife are agreeable to this plan and will call if any problems develop in the interim.

## 2011-09-20 NOTE — Patient Instructions (Addendum)
Stay on your current medicines but cut the Coreg back to just a half of a tablet two times a day  I will see you in a month with a repeat EKG  Keep riding your bike!  We need to check labs today.  Call the [facility] office at [phone] if you have any questions, problems or concerns.

## 2011-09-21 LAB — BASIC METABOLIC PANEL
BUN: 17 mg/dL (ref 6–23)
CO2: 27 mEq/L (ref 19–32)
Calcium: 10.1 mg/dL (ref 8.4–10.5)
Chloride: 98 mEq/L (ref 96–112)
Creatinine, Ser: 1.1 mg/dL (ref 0.4–1.5)
GFR: 69.09 mL/min (ref >60.00)
Glucose, Bld: 91 mg/dL (ref 70–99)
Potassium: 4.6 mEq/L (ref 3.5–5.1)
Sodium: 133 mEq/L — ABNORMAL LOW (ref 135–145)

## 2011-09-21 LAB — MAGNESIUM: Magnesium: 1.8 mg/dL (ref 1.5–2.5)

## 2011-09-22 ENCOUNTER — Telehealth: Payer: Self-pay | Admitting: *Deleted

## 2011-09-22 NOTE — Telephone Encounter (Signed)
Message copied by Awilda Bill on Fri Sep 22, 2011  9:14 AM ------      Message from: Rosalio Macadamia      Created: Fri Sep 22, 2011  7:41 AM       Ok to report. Labs are satisfactory. Please inform his wife. She has only been giving him the mag ox QD. Based on these labs, I would keep everything the same.

## 2011-09-22 NOTE — Telephone Encounter (Signed)
Pts wife informed of lab results.  Vista Mink, CMA

## 2011-10-23 ENCOUNTER — Ambulatory Visit (INDEPENDENT_AMBULATORY_CARE_PROVIDER_SITE_OTHER): Payer: Medicare Other | Admitting: Nurse Practitioner

## 2011-10-23 ENCOUNTER — Encounter: Payer: Self-pay | Admitting: Nurse Practitioner

## 2011-10-23 VITALS — BP 136/70 | HR 48 | Resp 18 | Ht 73.0 in | Wt 202.8 lb

## 2011-10-23 DIAGNOSIS — I498 Other specified cardiac arrhythmias: Secondary | ICD-10-CM

## 2011-10-23 DIAGNOSIS — R001 Bradycardia, unspecified: Secondary | ICD-10-CM

## 2011-10-23 NOTE — Patient Instructions (Addendum)
Stop the Coreg altogether.  Stay on your other medicines  I will see you in 3 weeks with a repeat EKG.   Call the [facility] office at [phone] if you have any questions, problems or concerns.

## 2011-10-23 NOTE — Progress Notes (Signed)
[name] Date of Birth: 1920/02/24 Medical Record #[id number]  History of Present Illness: Troy Freeman is seen back today for a 1 month check. He is seen for Dr. Sw[place]iland. He is 89. He has known CAD with LV dysfunction. He has been managed conservatively. His other problems includes chronic atrial fib, coumadin therapy, and progressive dementia. His wife provides around the clock care.   He comes in today. He is here with his wife. She provides most of the history. She feels like Troy Freeman is doing well. No chest pain. Remains unsteady but using his walker. No falls. She says he will sometimes say he is dizzy in the mornings but she feels like he is doing pretty well overall.   Current Outpatient Prescriptions on File Prior to Visit  Medication Sig Dispense Refill  . Acetaminophen (TYLENOL 8 HOUR PO) Take 1,000 mg by mouth 2 (two) times daily.       . Calcium Carbonate-Vitamin D (CALCIUM + D PO) Take 600 mg by mouth daily. 3 TABS DAILY       . furosemide (LASIX) 20 MG tablet Use prn for weight gain of 3 pounds in 24 hours. Take along with Potassium.      . magnesium oxide (MAG-OX) 400 MG tablet Take 400 mg by mouth daily.       Marland Kitchen omeprazole (PRILOSEC) 20 MG capsule Take 20 mg by mouth daily.        . potassium chloride (MICRO-K) 10 MEQ CR capsule Take 10 mEq by mouth daily. Take only on days that Lasix is taken.      . simvastatin (ZOCOR) 40 MG tablet Take 40 mg by mouth at bedtime.        Marland Kitchen warfarin (COUMADIN) 1 MG tablet Take 4 mg by mouth daily.         Allergies  Allergen Reactions  . Codeine Nausea And Vomiting  . Penicillins Rash    Past Medical History  Diagnosis Date  . Bradycardia   . IHD (ischemic heart disease) July 2002    Large anterior MI with PCI to LAD with subsequent CABG x 6 in 2002  . LV dysfunction     EF is 30 to 35%; does not tolerate ACE due to cough and dizziness  . MI, old     OLD ANTERIOR MI  . Atrial fibrillation     managed on coumadin and with rate  control  . S/P CABG (coronary artery bypass graft) 2002  . Fall Aug 2012    with fractured right hip  . BPH (benign prostatic hyperplasia)   . GERD (gastroesophageal reflux disease)   . Dementia     mild  . Chronic anticoagulation     Past Surgical History  Procedure Date  . Coronary angioplasty 07/2000    LAD  . Coronary artery bypass graft 08/2000    LIMA to LAD, SVG to RCA and PD, SVG to OM1 and OM2, and SVG to DX  . Right hip surgery 1996 & 2012  . Left hip surgery 2007    History  Smoking status  . Former Smoker  . Quit date: 01/09/1958  Smokeless tobacco  . Not on file    History  Alcohol Use No    Family History  Problem Relation Age of Onset  . Cancer Mother   . Heart disease Father     Review of Systems: The review of systems is per the HPI.  All other systems were reviewed and are negative.  Physical Exam: BP 136/70  Pulse 48  Resp 18  Ht 6\' 1"  (1.854 m)  Wt 202 lb 12.8 oz (91.989 kg)  BMI 26.76 kg/m2  SpO2 94% Patient is very pleasant and in no acute distress. Skin is warm and dry. Color is normal.  HEENT is unremarkable. Normocephalic/atraumatic. PERRL. Sclera are nonicteric. Neck is supple. No masses. No JVD. Lungs are clear. Cardiac exam shows an irregular rate and rhythm. His rate is 48 by me. Abdomen is soft. Extremities are without edema. Gait is unsteady but he has his walker. ROM appears intact. No gross neurologic deficits noted.  LABORATORY DATA: EKG today shows atrial fib with a slow VR. Rate is 44.  Lab Results  Component Value Date   WBC 6.2 09/20/2011   HGB 13.6 09/20/2011   HCT 40.7 09/20/2011   PLT 252.0 09/20/2011   GLUCOSE 91 09/20/2011   CHOL 145 08/21/2010   TRIG 85 08/21/2010   HDL 52 08/21/2010   LDLCALC 76 08/21/2010   ALT 14 03/27/2011   AST 25 03/27/2011   NA 133* 09/20/2011   K 4.6 09/20/2011   CL 98 09/20/2011   CREATININE 1.1 09/20/2011   BUN 17 09/20/2011   CO2 27 09/20/2011   TSH 3.488 08/21/2010   INR 2.79* 03/27/2011      Assessment / Plan: 1. Bradycardia - I am stopping his Coreg altogether. He is pretty asymptomatic. Hopefully we can avoid PTVP.  2. Chronic atrial fib - His rate is slow. Coreg is stopped. He remains on his coumadin  I will see him back in about 3 weeks with a repeat EKG. Dixie will let us know if his condition changes or problems arise.

## 2011-11-13 ENCOUNTER — Encounter: Payer: Self-pay | Admitting: Nurse Practitioner

## 2011-11-13 ENCOUNTER — Ambulatory Visit (INDEPENDENT_AMBULATORY_CARE_PROVIDER_SITE_OTHER): Payer: Medicare Other | Admitting: Nurse Practitioner

## 2011-11-13 VITALS — BP 120/76 | HR 52 | Ht 73.0 in | Wt 203.8 lb

## 2011-11-13 DIAGNOSIS — R001 Bradycardia, unspecified: Secondary | ICD-10-CM

## 2011-11-13 DIAGNOSIS — I498 Other specified cardiac arrhythmias: Secondary | ICD-10-CM

## 2011-11-13 NOTE — Progress Notes (Signed)
Troy Freeman Date of Birth: 11/22/1920 Medical Record #[id number]  History of Present Illness: Troy Freeman is seen back today for a one month check. He is seen for Dr. Swaziland. He is 89. He has known CAD with LV dysfunction. He has been managed conservatively. His other problems includes chronic atrial fib, coumadin therapy and progressive dementia. His wife provides around the clock care for him. He is quite hard of hearing.   He comes in today. He is here with his wife. She provides the history. He is off of his Coreg due to bradycardia. Heart rates were in the low 40's. We stopped it. She says he is doing well. No chest pain. Not short of breath. Does not say that he has been dizzy or lightheaded. No chest pain. His neck has been a little sore with turning and is felt to be from arthritis.   Current Outpatient Prescriptions on File Prior to Visit  Medication Sig Dispense Refill  . Acetaminophen (TYLENOL 8 HOUR PO) Take 1,000 mg by mouth 2 (two) times daily.       . Calcium Carbonate-Vitamin D (CALCIUM + D PO) Take 600 mg by mouth daily. 3 TABS DAILY       . furosemide (LASIX) 20 MG tablet Use prn for weight gain of 3 pounds in 24 hours. Take along with Potassium.      . magnesium oxide (MAG-OX) 400 MG tablet Take 400 mg by mouth daily.       Marland Kitchen omeprazole (PRILOSEC) 20 MG capsule Take 20 mg by mouth daily.        . potassium chloride (MICRO-K) 10 MEQ CR capsule Take 10 mEq by mouth daily. Take only on days that Lasix is taken.      . simvastatin (ZOCOR) 40 MG tablet Take 40 mg by mouth at bedtime.        Marland Kitchen warfarin (COUMADIN) 1 MG tablet Take 4 mg by mouth as directed.         Allergies  Allergen Reactions  . Codeine Nausea And Vomiting  . Penicillins Rash    Past Medical History  Diagnosis Date  . Bradycardia   . IHD (ischemic heart disease) July 2002    Large anterior MI with PCI to LAD with subsequent CABG x 6 in 2002  . LV dysfunction     EF is 30 to 35%; does not tolerate  ACE due to cough and dizziness  . MI, old     OLD ANTERIOR MI  . Atrial fibrillation     managed on coumadin and with rate control  . S/P CABG (coronary artery bypass graft) 2002  . Fall Aug 2012    with fractured right hip  . BPH (benign prostatic hyperplasia)   . GERD (gastroesophageal reflux disease)   . Dementia     mild  . Chronic anticoagulation     Past Surgical History  Procedure Date  . Coronary angioplasty 07/2000    LAD  . Coronary artery bypass graft 08/2000    LIMA to LAD, SVG to RCA and PD, SVG to OM1 and OM2, and SVG to DX  . Right hip surgery 1996 & 2012  . Left hip surgery 2007    History  Smoking status  . Former Smoker  . Quit date: 01/09/1958  Smokeless tobacco  . Not on file    History  Alcohol Use No    Family History  Problem Relation Age of Onset  . Cancer Mother   .  Heart disease Father     Review of Systems: The review of systems is per the HPI.  All other systems were reviewed and are negative.  Physical Exam: BP 120/76  Pulse 52  Ht 6\' 1"  (1.854 m)  Wt 203 lb 12.8 oz (92.443 kg)  BMI 26.89 kg/m2 Patient is very pleasant and in no acute distress. He is quite hard of hearing. kin is warm and dry. Color is normal.  HEENT is unremarkable. Normocephalic/atraumatic. PERRL. Sclera are nonicteric. Neck is supple. No masses. No JVD. Lungs are clear. Cardiac exam shows an irregular rhythm. His rate is controlled. Abdomen is soft. Extremities are without edema. Gait and ROM are intact. He is using a walker. No gross neurologic deficits noted.  LABORATORY DATA: EKG today shows sinus bradycardia. Rate is up to 52.    Assessment / Plan: 1. Bradycardia - asymptomatic. Off of Coreg. His heart rate has improved.   2. CAD - no chest pain.  3. Atrial fib - rate is controlled.  I think he is doing well. They will let us know if he starts to have any dizzy or passing out spells. Have left him off of his Coreg. Patient is agreeable to this plan and  will call if any problems develop in the interim.

## 2011-11-13 NOTE — Patient Instructions (Signed)
I think you are doing well.  Stay on your current medicines  Dr. Swaziland will see you in 6 months.  Call the [facility] office at [phone] if you have any questions, problems or concerns.

## 2012-04-09 ENCOUNTER — Other Ambulatory Visit: Payer: Self-pay | Admitting: Geriatric Medicine

## 2012-04-09 ENCOUNTER — Other Ambulatory Visit: Payer: Medicare Other

## 2012-04-09 DIAGNOSIS — Z7901 Long term (current) use of anticoagulants: Secondary | ICD-10-CM

## 2012-04-09 DIAGNOSIS — E785 Hyperlipidemia, unspecified: Secondary | ICD-10-CM

## 2012-04-09 DIAGNOSIS — I10 Essential (primary) hypertension: Secondary | ICD-10-CM

## 2012-04-09 DIAGNOSIS — D649 Anemia, unspecified: Secondary | ICD-10-CM

## 2012-04-09 DIAGNOSIS — E538 Deficiency of other specified B group vitamins: Secondary | ICD-10-CM

## 2012-04-10 LAB — CBC WITH DIFFERENTIAL/PLATELET
Basophils Absolute: 0 10*3/uL (ref 0.0–0.2)
Basos: 1 % (ref 0–3)
Eos: 3 % (ref 0–5)
Eosinophils Absolute: 0.2 10*3/uL (ref 0.0–0.4)
HCT: 41.7 % (ref 37.5–51.0)
Hemoglobin: 14.6 g/dL (ref 12.6–17.7)
Immature Grans (Abs): 0 10*3/uL (ref 0.0–0.1)
Immature Granulocytes: 0 % (ref 0–2)
Lymphocytes Absolute: 1.4 10*3/uL (ref 0.7–3.1)
Lymphs: 25 % (ref 14–46)
MCH: 31.9 pg (ref 26.6–33.0)
MCHC: 35 g/dL (ref 31.5–35.7)
MCV: 91 fL (ref 79–97)
Monocytes Absolute: 0.6 10*3/uL (ref 0.1–0.9)
Monocytes: 11 % (ref 4–12)
Neutrophils Absolute: 3.3 10*3/uL (ref 1.4–7.0)
Neutrophils Relative %: 60 % (ref 40–74)
RBC: 4.57 x10E6/uL (ref 4.14–5.80)
RDW: 13.1 % (ref 12.3–15.4)
WBC: 5.5 10*3/uL (ref 3.4–10.8)

## 2012-04-10 LAB — COMPREHENSIVE METABOLIC PANEL
ALT: 16 IU/L (ref 0–44)
AST: 26 IU/L (ref 0–40)
Albumin/Globulin Ratio: 2 (ref 1.1–2.5)
Albumin: 4.4 g/dL (ref 3.2–4.6)
Alkaline Phosphatase: 54 IU/L (ref 39–117)
BUN/Creatinine Ratio: 13 (ref 10–22)
BUN: 13 mg/dL (ref 10–36)
CO2: 23 mmol/L (ref 19–28)
Calcium: 9.5 mg/dL (ref 8.6–10.2)
Chloride: 97 mmol/L (ref 97–108)
Creatinine, Ser: 1.04 mg/dL (ref 0.76–1.27)
GFR calc Af Amer: 73 mL/min/{1.73_m2} (ref >59)
GFR calc non Af Amer: 63 mL/min/{1.73_m2} (ref >59)
Globulin, Total: 2.2 g/dL (ref 1.5–4.5)
Glucose: 85 mg/dL (ref 65–99)
Potassium: 4.3 mmol/L (ref 3.5–5.2)
Sodium: 135 mmol/L (ref 134–144)
Total Bilirubin: 0.7 mg/dL (ref 0.0–1.2)
Total Protein: 6.6 g/dL (ref 6.0–8.5)

## 2012-04-10 LAB — PROTIME-INR
INR: 2.2 — ABNORMAL HIGH (ref 0.8–1.2)
Prothrombin Time: 23.1 s — ABNORMAL HIGH (ref 9.1–12.0)

## 2012-04-10 LAB — FOLATE: Folate: 16.2 ng/mL (ref >3.0)

## 2012-04-10 LAB — LIPID PANEL
Chol/HDL Ratio: 3.1 ratio units (ref 0.0–5.0)
Cholesterol, Total: 145 mg/dL (ref 100–199)
HDL: 47 mg/dL (ref >39)
LDL Calculated: 71 mg/dL (ref 0–99)
Triglycerides: 137 mg/dL (ref 0–149)
VLDL Cholesterol Cal: 27 mg/dL (ref 5–40)

## 2012-04-10 LAB — VITAMIN B12: Vitamin B-12: 360 pg/mL (ref 211–946)

## 2012-04-11 ENCOUNTER — Encounter: Payer: Self-pay | Admitting: Internal Medicine

## 2012-04-11 ENCOUNTER — Ambulatory Visit (INDEPENDENT_AMBULATORY_CARE_PROVIDER_SITE_OTHER): Payer: Medicare Other | Admitting: Internal Medicine

## 2012-04-11 VITALS — BP 120/70 | HR 68 | Temp 97.6°F | Resp 20 | Ht 74.0 in | Wt 182.0 lb

## 2012-04-11 DIAGNOSIS — I255 Ischemic cardiomyopathy: Secondary | ICD-10-CM

## 2012-04-11 DIAGNOSIS — I259 Chronic ischemic heart disease, unspecified: Secondary | ICD-10-CM

## 2012-04-11 DIAGNOSIS — E785 Hyperlipidemia, unspecified: Secondary | ICD-10-CM

## 2012-04-11 DIAGNOSIS — R609 Edema, unspecified: Secondary | ICD-10-CM

## 2012-04-11 DIAGNOSIS — R001 Bradycardia, unspecified: Secondary | ICD-10-CM

## 2012-04-11 DIAGNOSIS — I498 Other specified cardiac arrhythmias: Secondary | ICD-10-CM

## 2012-04-11 DIAGNOSIS — I2589 Other forms of chronic ischemic heart disease: Secondary | ICD-10-CM

## 2012-04-11 DIAGNOSIS — I4891 Unspecified atrial fibrillation: Secondary | ICD-10-CM

## 2012-04-11 DIAGNOSIS — F0150 Vascular dementia without behavioral disturbance: Secondary | ICD-10-CM

## 2012-04-11 DIAGNOSIS — I10 Essential (primary) hypertension: Secondary | ICD-10-CM | POA: Insufficient documentation

## 2012-04-11 NOTE — Assessment & Plan Note (Signed)
No chest pain or shortness of breath. 

## 2012-04-11 NOTE — Assessment & Plan Note (Signed)
Improved with compression hose.  Continues on lasix.  No changes needed.  Weight stable.

## 2012-04-11 NOTE — Progress Notes (Signed)
Patient ID: Troy Freeman, male   DOB: 10/15/1920, 79 y.o.   MRN: [id number]  Code Status:   DNR, has living will--asked his wife to please bring [place] a copy    Allergies  Allergen Reactions  . Aricept (Donepezil)     lethargy  . Codeine Nausea And Vomiting  . Penicillins Rash    Chief Complaint  Patient presents with  . Annual Exam    HPI: Patient is a 79 y.o. Caucasian male seen in the office today for his annual physical to review his chronic conditions.    Knees bother him some for arthritis.    Memory has been stable lately.  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  Sometimes doesn't recall names especially grandchildren.    Has skin area on right cheek--going to see his son in law about that.    Review of Systems:  Review of Systems  Constitutional: Negative for fever, chills and weight loss.  HENT: Positive for neck pain. Negative for congestion.   Eyes: Negative for blurred vision.  Respiratory: Negative for shortness of breath.   Cardiovascular: Negative for chest pain.  Gastrointestinal: Negative for heartburn, nausea, vomiting, abdominal pain, diarrhea and constipation.  Genitourinary: Negative for dysuria, urgency and frequency.  Musculoskeletal: Negative for myalgias, joint pain and falls.  Skin: Negative for rash.  Neurological: Positive for sensory change. Negative for dizziness, weakness and headaches.       No recent falls C/o numbness and tingling and "falling asleep of left arm"  Endo/Heme/Allergies: Bruises/bleeds easily.  Psychiatric/Behavioral: Positive for memory loss. Negative for depression. The patient does not have insomnia.      Past Medical History  Diagnosis Date  . Bradycardia   . IHD (ischemic heart disease) July 2002    Large anterior MI with PCI to LAD with subsequent CABG x 6 in 2002  . LV dysfunction     EF is 30 to 35%; does not tolerate ACE due to cough and dizziness  . MI, old     OLD ANTERIOR MI  .  Atrial fibrillation     managed on coumadin and with rate control  . S/P CABG (coronary artery bypass graft) 2002  . Fall Aug 2012    with fractured right hip  . BPH (benign prostatic hyperplasia)   . GERD (gastroesophageal reflux disease)   . Dementia     mild  . Chronic anticoagulation   . Hyperlipidemia   . Hypertension   . Arthritis     Osteoarthritis  . Syncope 09/11/2008   Past Surgical History  Procedure Laterality Date  . Coronary angioplasty  07/2000    LAD  . Coronary artery bypass graft  08/2000    LIMA to LAD, SVG to RCA and PD, SVG to OM1 and OM2, and SVG to DX  . Right hip surgery Right 1996 & 2012    Replacement  . Left hip surgery Left 04/18/2005    Total replacement  . Knee arthroscopy Right     Arthroscopic surgery  . Eye surgery  2001    Cataract surgery  . Eye surgery  2006    Cataract surgery  . Esophagogastroduodenoscopy endoscopy    . Colon surgery  2004    Colonoscopy  . Colon surgery  10/29/2003    Colonoscopy   Social History:   reports that he quit smoking about 54 years ago. His smoking use included Cigarettes. He has a 30 pack-year smoking history.  He does not have any smokeless tobacco history on file. He reports that he does not drink alcohol or use illicit drugs.  Family History  Problem Relation Age of Onset  . Cancer Mother   . Stroke Mother   . Heart disease Father   . Stroke Father   . Cancer Sister   . Cancer Brother   . Early death Brother     Medications: Patient's Medications  New Prescriptions   No medications on file  Previous Medications   ACETAMINOPHEN (TYLENOL 8 HOUR PO)    Take 1,000 mg by mouth 2 (two) times daily.    CALCIUM CARBONATE-VITAMIN D (CALCIUM + D PO)    Take 600 mg by mouth daily. 3 TABS DAILY    FUROSEMIDE (LASIX) 20 MG TABLET    Use prn for weight gain of 3 pounds in 24 hours. Take along with Potassium.   MAGNESIUM OXIDE (MAG-OX) 400 MG TABLET    Take 400 mg by mouth daily.    OMEPRAZOLE (PRILOSEC)  20 MG CAPSULE    Take 20 mg by mouth daily.     POTASSIUM CHLORIDE (MICRO-K) 10 MEQ CR CAPSULE    Take 10 mEq by mouth daily. Take only on days that Lasix is taken.   SIMVASTATIN (ZOCOR) 40 MG TABLET    Take 40 mg by mouth at bedtime.     WARFARIN (COUMADIN) 1 MG TABLET    Take 4 mg by mouth as directed. Take 4mg  each Friday and Monday. Take 3mg  on Tuesday, Wednesday, Thursday, Saturday, and Sunday.  Modified Medications   No medications on file  Discontinued Medications   No medications on file     Physical Exam:  Filed Vitals:   04/11/12 1109  BP: 120/70  Pulse: 68  Temp: 97.6 F (36.4 C)  TempSrc: Oral  Resp: 20  Height: 6\' 2"  (1.88 m)  Weight: 182 lb (82.555 kg)  SpO2: 97%  Physical Exam  Constitutional: He appears well-developed and well-nourished. No distress.  HENT:  Head: Normocephalic and atraumatic.  Eyes: Pupils are equal, round, and reactive to light.  Wears glasses  Neck: Normal range of motion. Neck supple.  No tenderness  Cardiovascular: Intact distal pulses.  Exam reveals no gallop and no friction rub.   No murmur heard. irreg irreg  Pulmonary/Chest: Effort normal and breath sounds normal. No respiratory distress. He has no wheezes. He has no rales.  Abdominal: Soft. Bowel sounds are normal. He exhibits no distension and no mass. There is no tenderness.  Musculoskeletal: Normal range of motion. He exhibits no tenderness.  Crepitus of b/l knees, walks with walker Some thenar atrophy of left hand  Neurological: He is alert. No cranial nerve deficit. Coordination normal.  Oriented to person and place, not time  Skin: Skin is warm and dry.  Psychiatric: He has a normal mood and affect. His behavior is normal.   Labs reviewed: Basic Metabolic Panel:  Recent Labs  16/10/96 1157 04/09/12 0916  NA 133* 135  K 4.6 4.3  CL 98 97  CO2 27 23  GLUCOSE 91 85  BUN 17 13  CREATININE 1.1 1.04  CALCIUM 10.1 9.5  MG 1.8  --    Liver Function Tests:  Recent  Labs  04/09/12 0916  AST 26  ALT 16  ALKPHOS 54  BILITOT 0.7  PROT 6.6  CBC:  Recent Labs  09/20/11 1157 04/09/12 0916  WBC 6.2 5.5  NEUTROABS 4.2 3.3  HGB 13.6 14.6  HCT 40.7  41.7  MCV 94.4 91  PLT 252.0  --    Lipid Panel:  Recent Labs  04/09/12 0916  HDL 47  LDLCALC 71  TRIG 137  CHOLHDL 3.1    Past Procedures: 08/16/2010-Right Knee X-Ray: Osteoarthritis. Small joint effusion 08/20/2010-Right Femur X-Ray: Acute nondisplaced fracture through the right greater trochanter. Preserved alignment of right total hip arthroplasty components and no hardware failure identified. 08/20/2010-CT Pelvis: Right total hip replacement with a mildly distracted greater trochanter fracture identified. No other acute bony or joint abnormality. Small descending abdominal aortic aneurysm 3.1 cm is incompletely visualized. Assessment/Plan Atrial fibrillation No palpitations.  Doing fine.  INR therapeutic.  Cont same dose and repeat in a month.    IHD (ischemic heart disease) No chest pain or shortness of breath.    Bradycardia Not currently bradycardic.  Stable HR.  Essential hypertension, benign bp at goal with diuretic only.  Ischemic cardiomyopathy Stable.  No signs of acute CHF.  Edema Improved with compression hose.  Continues on lasix.  No changes needed.  Weight stable.   Labs/tests ordered:  PT/INR in one month;  Cbc, bmp, INR in 3 mos before f/u visit.

## 2012-04-11 NOTE — Assessment & Plan Note (Signed)
Stable.  No signs of acute CHF.

## 2012-04-11 NOTE — Assessment & Plan Note (Signed)
Not currently bradycardic.  Stable HR.

## 2012-04-11 NOTE — Assessment & Plan Note (Signed)
No palpitations.  Doing fine.  INR therapeutic.  Cont same dose and repeat in a month.

## 2012-04-11 NOTE — Assessment & Plan Note (Signed)
bp at goal with diuretic only.

## 2012-05-14 ENCOUNTER — Other Ambulatory Visit: Payer: Medicare Other

## 2012-05-14 DIAGNOSIS — I4891 Unspecified atrial fibrillation: Secondary | ICD-10-CM

## 2012-05-15 LAB — PROTIME-INR
INR: 2.4 — ABNORMAL HIGH (ref 0.8–1.2)
Prothrombin Time: 25.7 s — ABNORMAL HIGH (ref 9.1–12.0)

## 2012-05-17 ENCOUNTER — Telehealth: Payer: Self-pay | Admitting: *Deleted

## 2012-05-17 NOTE — Telephone Encounter (Signed)
Mrs. Troy Freeman called and wanted to know about her husband PT/INR Results.  INR was 2.4 I told her to continue same dose and repeat in one month 4 MG on Monday and Friday 3 MG all other days

## 2012-05-27 NOTE — Telephone Encounter (Signed)
Per Dr Maida Sale, that is fine

## 2012-06-12 ENCOUNTER — Telehealth: Payer: Self-pay | Admitting: Cardiology

## 2012-06-12 NOTE — Telephone Encounter (Signed)
New problem    pts wife states they need a note saying he needs a stair lift to get up the stairs

## 2012-06-12 NOTE — Telephone Encounter (Signed)
Pt's wife called to ask Dr. Swaziland if he can write a note for the "[employer]" saying  specificaly that pt needs a stair lift to get up the stair at home. Pt gets out of breath when he goes up the steps. Pt and wife have been trying to sell the house for sometime, so they can get a one level house and it has't sold.

## 2012-06-14 ENCOUNTER — Other Ambulatory Visit: Payer: Medicare Other

## 2012-06-14 DIAGNOSIS — I4891 Unspecified atrial fibrillation: Secondary | ICD-10-CM

## 2012-06-14 NOTE — Telephone Encounter (Signed)
msg left that DR/nurse are out till Wednesday 06/19/12, if note needed sooner please call your primaryDr Renato Gails, msg asked her to call with further questions or concerns. Number provided.

## 2012-06-14 NOTE — Telephone Encounter (Signed)
Follow UP  Pt wife is calling back about the note for him to get a stair lift.

## 2012-06-15 LAB — PROTIME-INR
INR: 2.1 — ABNORMAL HIGH (ref 0.8–1.2)
Prothrombin Time: 22 s — ABNORMAL HIGH (ref 9.1–12.0)

## 2012-06-15 NOTE — Telephone Encounter (Signed)
I think his request for a lift chair will need to be directed to his primary care- Dr. Renato Gails.  Troy Schermerhorn Sw[place] MD, [facility]

## 2012-06-18 ENCOUNTER — Telehealth: Payer: Self-pay | Admitting: Cardiology

## 2012-06-18 NOTE — Telephone Encounter (Signed)
Returned call to [employer] rings busy.

## 2012-06-18 NOTE — Telephone Encounter (Signed)
See previous 06/18/12 note.

## 2012-06-18 NOTE — Telephone Encounter (Signed)
Patient's wife called no answer.LMTC. 

## 2012-06-18 NOTE — Telephone Encounter (Signed)
Spoke to patient's wife Dixie advised to contact PCP for lift chair.

## 2012-06-18 NOTE — Telephone Encounter (Signed)
Follow-up: ° ° ° °Patient's wife called in returning your call.  Please call back. °

## 2012-06-18 NOTE — Telephone Encounter (Signed)
Returned call to [place] phone rings busy. 

## 2012-06-26 ENCOUNTER — Encounter: Payer: Self-pay | Admitting: Cardiology

## 2012-06-26 ENCOUNTER — Ambulatory Visit (INDEPENDENT_AMBULATORY_CARE_PROVIDER_SITE_OTHER): Payer: Medicare Other | Admitting: Cardiology

## 2012-06-26 VITALS — BP 138/82 | HR 73 | Ht 74.0 in | Wt 204.0 lb

## 2012-06-26 DIAGNOSIS — I4891 Unspecified atrial fibrillation: Secondary | ICD-10-CM

## 2012-06-26 DIAGNOSIS — I255 Ischemic cardiomyopathy: Secondary | ICD-10-CM

## 2012-06-26 DIAGNOSIS — I519 Heart disease, unspecified: Secondary | ICD-10-CM

## 2012-06-26 DIAGNOSIS — I480 Paroxysmal atrial fibrillation: Secondary | ICD-10-CM

## 2012-06-26 DIAGNOSIS — I252 Old myocardial infarction: Secondary | ICD-10-CM

## 2012-06-26 DIAGNOSIS — I2589 Other forms of chronic ischemic heart disease: Secondary | ICD-10-CM

## 2012-06-26 DIAGNOSIS — R001 Bradycardia, unspecified: Secondary | ICD-10-CM

## 2012-06-26 DIAGNOSIS — I498 Other specified cardiac arrhythmias: Secondary | ICD-10-CM

## 2012-06-26 NOTE — Progress Notes (Signed)
[name] Date of Birth: [birth year] Medical Record #[id number]  History of Present Illness: [name] is seen for a followup visit. He is 89. He has known CAD with LV dysfunction. He has been managed conservatively. His other problems includes chronic atrial fib, coumadin therapy and progressive dementia. His wife provides around the clock care for him. He is quite hard of hearing.  On followup today he reports he has done very well over the past 6 months. He is limited by bad arthritis in his knees. He has had no recurrent bradycardia since his carvedilol was discontinued. He has rarely had to use Lasix. He denies any chest pain, palpitations, or shortness of breath.  Current Outpatient Prescriptions on File Prior to Visit  Medication Sig Dispense Refill  . Acetaminophen (TYLENOL 8 HOUR PO) Take 1,000 mg by mouth 2 (two) times daily.       . Calcium Carbonate-Vitamin D (CALCIUM + D PO) Take 600 mg by mouth daily. 3 TABS DAILY       . furosemide (LASIX) 20 MG tablet Use prn for weight gain of 3 pounds in 24 hours. Take along with Potassium.      . magnesium oxide (MAG-OX) 400 MG tablet Take 400 mg by mouth daily.       Marland Kitchen omeprazole (PRILOSEC) 20 MG capsule Take 20 mg by mouth daily.        . potassium chloride (MICRO-K) 10 MEQ CR capsule Take 10 mEq by mouth daily. Take only on days that Lasix is taken.      . simvastatin (ZOCOR) 40 MG tablet Take 40 mg by mouth at bedtime.        Marland Kitchen warfarin (COUMADIN) 1 MG tablet Take 4 mg by mouth as directed. Take 4mg  each Friday and Monday. Take 3mg  on Tuesday, Wednesday, Thursday, Saturday, and Sunday.       No current facility-administered medications on file prior to visit.    Allergies  Allergen Reactions  . Aricept (Donepezil)     lethargy  . Codeine Nausea And Vomiting  . Penicillins Rash    Past Medical History  Diagnosis Date  . Bradycardia   . IHD (ischemic heart disease) July 2002    Large anterior MI with PCI to LAD  with subsequent CABG x 6 in 2002  . LV dysfunction     EF is 30 to 35%; does not tolerate ACE due to cough and dizziness  . MI, old     OLD ANTERIOR MI  . Atrial fibrillation     managed on coumadin and with rate control  . S/P CABG (coronary artery bypass graft) 2002  . Fall Aug 2012    with fractured right hip  . BPH (benign prostatic hyperplasia)   . GERD (gastroesophageal reflux disease)   . Dementia     mild  . Chronic anticoagulation   . Hyperlipidemia   . Hypertension   . Arthritis     Osteoarthritis  . Syncope 09/11/2008    Past Surgical History  Procedure Laterality Date  . Coronary angioplasty  07/2000    LAD  . Coronary artery bypass graft  08/2000    LIMA to LAD, SVG to RCA and PD, SVG to OM1 and OM2, and SVG to DX  . Right hip surgery Right 1996 & 2012    Replacement  . Left hip surgery Left 04/18/2005    Total replacement  . Knee arthroscopy Right     Arthroscopic surgery  . Eye  surgery  2001    Cataract surgery  . Eye surgery  2006    Cataract surgery  . Esophagogastroduodenoscopy endoscopy    . Colon surgery  2004    Colonoscopy  . Colon surgery  10/29/2003    Colonoscopy    History  Smoking status  . Former Smoker -- 1.00 packs/day for 30 years  . Types: Cigarettes  . Quit date: 01/09/1958  Smokeless tobacco  . Not on file    History  Alcohol Use No    Family History  Problem Relation Age of Onset  . Cancer Mother   . Stroke Mother   . Heart disease Father   . Stroke Father   . Cancer Sister   . Cancer Brother   . Early death Brother     Review of Systems: The review of systems is per the HPI.  All other systems were reviewed and are negative.  Physical Exam: BP 138/82  Pulse 73  Ht 6\' 2"  (1.88 m)  Wt 204 lb (92.534 kg)  BMI 26.18 kg/m2  SpO2 96% Patient is very pleasant and in no acute distress. He is quite hard of hearing. kin is warm and dry. Color is normal.  HEENT is unremarkable. Normocephalic/atraumatic. PERRL.  Sclera are nonicteric. Neck is supple. No masses. No JVD. Lungs are clear. Cardiac exam shows an irregular rhythm. His rate is controlled. Abdomen is soft. Extremities are without edema. Gait and ROM are intact. He is using a walker. No gross neurologic deficits noted.  LABORATORY DATA:     Assessment / Plan: 1. Bradycardia - asymptomatic. No recurrence since beta blocker discontinued.   2. CAD - no chest pain.  3. Atrial fib - rate is controlled. He is on chronic anticoagulation.  His wife is concerned about his legs getting weak since he is unable to walk much because of his bad arthritis. She asked about getting physical therapy for him. I think this is a reasonable idea as directed her to talk with either her primary care or his orthopedic doctor about this.

## 2012-06-26 NOTE — Patient Instructions (Signed)
Continue your current therapy  I will see you in 6 months.   

## 2012-07-09 ENCOUNTER — Encounter: Payer: Self-pay | Admitting: *Deleted

## 2012-07-11 ENCOUNTER — Ambulatory Visit (INDEPENDENT_AMBULATORY_CARE_PROVIDER_SITE_OTHER): Payer: Medicare Other | Admitting: Internal Medicine

## 2012-07-11 VITALS — BP 132/70 | HR 68 | Temp 98.2°F | Resp 18 | Ht 74.0 in | Wt 204.4 lb

## 2012-07-11 DIAGNOSIS — I4891 Unspecified atrial fibrillation: Secondary | ICD-10-CM

## 2012-07-11 DIAGNOSIS — R269 Unspecified abnormalities of gait and mobility: Secondary | ICD-10-CM

## 2012-07-11 DIAGNOSIS — M1710 Unilateral primary osteoarthritis, unspecified knee: Secondary | ICD-10-CM

## 2012-07-11 DIAGNOSIS — M1990 Unspecified osteoarthritis, unspecified site: Secondary | ICD-10-CM | POA: Insufficient documentation

## 2012-07-11 DIAGNOSIS — IMO0002 Reserved for concepts with insufficient information to code with codable children: Secondary | ICD-10-CM

## 2012-07-11 DIAGNOSIS — M170 Bilateral primary osteoarthritis of knee: Secondary | ICD-10-CM

## 2012-07-11 NOTE — Progress Notes (Signed)
Patient ID: Troy Freeman, male   DOB: May 24, 1920, 79 y.o.   MRN: [id number] Location:  [facility] / [facility]  Code Status: DNR   Allergies  Allergen Reactions  . Aricept (Donepezil)     lethargy  . Codeine Nausea And Vomiting  . Penicillins Rash    Chief Complaint  Patient presents with  . Follow-up    HPI: Patient is a 79 y.o. 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  He is accompanied by his wife who provides the history.    Nothing new.  Did go to Dr. [place] last week.  Checked him out and he was ok.  INR has been therapeutic lately.  BP was at goal today.    Knees painful.  Using the voltaren gel--helping a whole lot.  He is getting weaker in his legs and having more trouble walking.  Uses his rolling walker.  Could not sell house.  Had to add stairlift.    Memory stable.  No significant changes.  Remains very HOH.  Still plays bridge on Tuesdays.    No recent falls.  Review of Systems:  Review of Systems  Constitutional: Negative for fever and malaise/fatigue.  HENT: Positive for hearing loss. Negative for congestion.   Eyes: Negative for blurred vision.  Respiratory: Negative for cough and shortness of breath.   Cardiovascular: Negative for chest pain and leg swelling.  Gastrointestinal: Negative for abdominal pain, constipation, blood in stool and melena.  Genitourinary: Negative for dysuria, urgency and frequency.  Musculoskeletal: Positive for joint pain. Negative for falls.  Skin: Negative for rash.  Neurological: Positive for weakness. Negative for dizziness, loss of consciousness and headaches.  Endo/Heme/Allergies: Bruises/bleeds easily.  Psychiatric/Behavioral: Positive for memory loss. Negative for depression. The patient does not have insomnia.      Past Medical History  Diagnosis Date  . Bradycardia   . IHD (ischemic heart disease) July 2002    Large anterior MI with PCI to LAD with subsequent CABG x 6 in 2002  . LV dysfunction     EF is 30 to 35%; does not tolerate ACE due to cough and dizziness  . MI, old     OLD ANTERIOR MI  . Atrial fibrillation     managed on coumadin and with rate control  . S/P CABG (coronary artery bypass graft) 2002  . Fall Aug 2012    with fractured right hip  . BPH (benign prostatic hyperplasia)   . GERD (gastroesophageal reflux disease)   . Dementia     mild  . Chronic anticoagulation   . Hyperlipidemia   . Hypertension   . Arthritis     Osteoarthritis  . Syncope 09/11/2008  . Osteoarthrosis, unspecified whether generalized or localized, unspecified site   . Long term (current) use of anticoagulants   . Congestive heart failure, unspecified   . Senile osteoporosis   . Other B-complex deficiencies   . Unspecified vitamin D deficiency   . Abnormality of gait   . Anemia, unspecified     Past Surgical History  Procedure Laterality Date  . Coronary angioplasty  07/2000    LAD  . Coronary artery bypass graft  08/2000    LIMA to LAD, SVG to RCA and PD, SVG to OM1 and OM2, and SVG to DX  . Right hip surgery  Right 1996 & 2012    Replacement  . Left hip surgery Left 04/18/2005    Total replacement  . Knee arthroscopy Right     Arthroscopic surgery  . Eye surgery  2001    Cataract surgery  . Eye surgery  2006    Cataract surgery  . Esophagogastroduodenoscopy endoscopy    . Colon surgery  2004    Colonoscopy  . Colon surgery  10/29/2003    Colonoscopy    Social History:   reports that he quit smoking about 54 years ago. His smoking use included Cigarettes. He has a 30 pack-year smoking history. He does not have any smokeless tobacco history on file. He reports that he does not drink alcohol or use illicit drugs.  Family History  Problem Relation Age of Onset  . Cancer Mother    . Stroke Mother   . Heart disease Father   . Stroke Father   . Cancer Sister   . Cancer Brother   . Early death Brother     Medications: Patient's Medications  New Prescriptions   No medications on file  Previous Medications   ACETAMINOPHEN (TYLENOL 8 HOUR PO)    Take 1,000 mg by mouth 2 (two) times daily.    CALCIUM CARBONATE-VITAMIN D (CALCIUM + D PO)    Take 600 mg by mouth daily. 3 TABS DAILY    FUROSEMIDE (LASIX) 20 MG TABLET    Use prn for weight gain of 3 pounds in 24 hours. Take along with Potassium.   MAGNESIUM OXIDE (MAG-OX) 400 MG TABLET    Take 400 mg by mouth daily.    OMEPRAZOLE (PRILOSEC) 20 MG CAPSULE    Take 20 mg by mouth daily.     POTASSIUM CHLORIDE (MICRO-K) 10 MEQ CR CAPSULE    Take 10 mEq by mouth daily. Take only on days that Lasix is taken.   SIMVASTATIN (ZOCOR) 40 MG TABLET    Take 40 mg by mouth at bedtime.     WARFARIN (COUMADIN) 1 MG TABLET    Take 4 mg by mouth as directed. Take 4mg  each Friday and Monday. Take 3mg  on Tuesday, Wednesday, Thursday, Saturday, and Sunday.  Modified Medications   No medications on file  Discontinued Medications   No medications on file     Physical Exam: Filed Vitals:   07/11/12 1108  BP: 132/70  Pulse: 68  Temp: 98.2 F (36.8 C)  TempSrc: Oral  Resp: 18  Height: 6\' 2"  (1.88 m)  Weight: 204 lb 6.4 oz (92.715 kg)  SpO2: 99%  Physical Exam  Constitutional: He appears well-developed and well-nourished. No distress.  HENT:  Head: Normocephalic and atraumatic.  Cardiovascular: Intact distal pulses.   irreg irreg  Pulmonary/Chest: Effort normal and breath sounds normal. No respiratory distress.  Abdominal: Soft. Bowel sounds are normal. He exhibits no distension. There is no tenderness.  Musculoskeletal: Normal range of motion. He exhibits tenderness. He exhibits no edema.  Walks with stooped posture with rolling walker, mild crepitus of knees, nontender  Neurological: He is alert.  Oriented to person and  place, not precise time  Skin: Skin is warm and dry.  Psychiatric: He has a normal mood and affect.    Labs reviewed: Basic Metabolic Panel:  Recent Labs  16/10/96 1157 04/09/12 0916  NA 133* 135  K 4.6 4.3  CL 98 97  CO2 27 23  GLUCOSE 91 85  BUN 17 13  CREATININE 1.1 1.04  CALCIUM 10.1 9.5  MG  1.8  --    Liver Function Tests:  Recent Labs  04/09/12 0916  AST 26  ALT 16  ALKPHOS 54  BILITOT 0.7  PROT 6.6   No results found for this basename: LIPASE, AMYLASE,  in the last 8760 hours No results found for this basename: AMMONIA,  in the last 8760 hours CBC:  Recent Labs  09/20/11 1157 04/09/12 0916  WBC 6.2 5.5  NEUTROABS 4.2 3.3  HGB 13.6 14.6  HCT 40.7 41.7  MCV 94.4 91  PLT 252.0  --    Lipid Panel:  Recent Labs  04/09/12 0916  HDL 47  LDLCALC 71  TRIG 137  CHOLHDL 3.1   Assessment/Plan 1. Abnormality of gait - Ambulatory referral to Physical Therapy for fall prevention, balance, and especially proximal muscle strengthening to help with gait stability -cont use of rolling walker (may benefit from rollator so he can sit if needed)  2. Arthritis - osteoarthritis of his knees -should do better when he is more mobile, sitting less--referred for some PT which should help lubricate his joints -cont voltaren gel - Ambulatory referral to Physical Therapy  3. A-fib -cont coumadin and rate control - Protime-INR ordered--his wife seems to prefer to continue to check his INR this way though fingerstick monitoring would probably be simpler for all parties  Labs/tests ordered: PT referral, INR Next appt:  4 mos

## 2012-07-12 LAB — PROTIME-INR
INR: 1.7 — ABNORMAL HIGH (ref 0.8–1.2)
Prothrombin Time: 18 s — ABNORMAL HIGH (ref 9.1–12.0)

## 2012-07-16 NOTE — Progress Notes (Signed)
Patient aware.

## 2012-07-23 ENCOUNTER — Ambulatory Visit: Payer: Medicare Other | Admitting: Physical Therapy

## 2012-08-07 ENCOUNTER — Ambulatory Visit: Payer: Medicare Other | Attending: Internal Medicine | Admitting: Physical Therapy

## 2012-08-07 DIAGNOSIS — R5381 Other malaise: Secondary | ICD-10-CM | POA: Insufficient documentation

## 2012-08-07 DIAGNOSIS — R269 Unspecified abnormalities of gait and mobility: Secondary | ICD-10-CM | POA: Insufficient documentation

## 2012-08-07 DIAGNOSIS — IMO0001 Reserved for inherently not codable concepts without codable children: Secondary | ICD-10-CM | POA: Insufficient documentation

## 2012-08-12 ENCOUNTER — Other Ambulatory Visit: Payer: Medicare Other

## 2012-08-12 ENCOUNTER — Other Ambulatory Visit: Payer: Self-pay | Admitting: *Deleted

## 2012-08-12 DIAGNOSIS — Z7901 Long term (current) use of anticoagulants: Secondary | ICD-10-CM

## 2012-08-12 DIAGNOSIS — I10 Essential (primary) hypertension: Secondary | ICD-10-CM

## 2012-08-13 LAB — CBC WITH DIFFERENTIAL/PLATELET
Basophils Absolute: 0 10*3/uL (ref 0.0–0.2)
Basos: 1 % (ref 0–3)
Eos: 3 % (ref 0–5)
Eosinophils Absolute: 0.2 10*3/uL (ref 0.0–0.4)
HCT: 41.1 % (ref 37.5–51.0)
Hemoglobin: 14 g/dL (ref 12.6–17.7)
Immature Grans (Abs): 0 10*3/uL (ref 0.0–0.1)
Immature Granulocytes: 0 % (ref 0–2)
Lymphocytes Absolute: 1 10*3/uL (ref 0.7–3.1)
Lymphs: 17 % (ref 14–46)
MCH: 31.3 pg (ref 26.6–33.0)
MCHC: 34.1 g/dL (ref 31.5–35.7)
MCV: 92 fL (ref 79–97)
Monocytes Absolute: 0.9 10*3/uL (ref 0.1–0.9)
Monocytes: 15 % — ABNORMAL HIGH (ref 4–12)
Neutrophils Absolute: 3.8 10*3/uL (ref 1.4–7.0)
Neutrophils Relative %: 64 % (ref 40–74)
RBC: 4.48 x10E6/uL (ref 4.14–5.80)
RDW: 13.6 % (ref 12.3–15.4)
WBC: 5.9 10*3/uL (ref 3.4–10.8)

## 2012-08-13 LAB — BASIC METABOLIC PANEL
BUN/Creatinine Ratio: 14 (ref 10–22)
BUN: 14 mg/dL (ref 10–36)
CO2: 23 mmol/L (ref 18–29)
Calcium: 9.7 mg/dL (ref 8.6–10.2)
Chloride: 97 mmol/L (ref 97–108)
Creatinine, Ser: 1.01 mg/dL (ref 0.76–1.27)
GFR calc Af Amer: 75 mL/min/{1.73_m2} (ref >59)
GFR calc non Af Amer: 65 mL/min/{1.73_m2} (ref >59)
Glucose: 84 mg/dL (ref 65–99)
Potassium: 4.4 mmol/L (ref 3.5–5.2)
Sodium: 134 mmol/L (ref 134–144)

## 2012-08-13 LAB — PROTIME-INR
INR: 1.9 — ABNORMAL HIGH (ref 0.8–1.2)
Prothrombin Time: 20 s — ABNORMAL HIGH (ref 9.1–12.0)

## 2012-08-20 ENCOUNTER — Other Ambulatory Visit: Payer: Self-pay | Admitting: Geriatric Medicine

## 2012-08-20 MED ORDER — OMEPRAZOLE 20 MG PO CPDR: 20.0000 mg | DELAYED_RELEASE_CAPSULE | Freq: Every day | ORAL | Status: DC

## 2012-08-26 ENCOUNTER — Other Ambulatory Visit: Payer: Medicare Other

## 2012-08-26 ENCOUNTER — Other Ambulatory Visit: Payer: Self-pay | Admitting: *Deleted

## 2012-08-26 DIAGNOSIS — Z7901 Long term (current) use of anticoagulants: Secondary | ICD-10-CM

## 2012-08-27 LAB — PROTIME-INR
INR: 2.1 — ABNORMAL HIGH (ref 0.8–1.2)
Prothrombin Time: 21.4 s — ABNORMAL HIGH (ref 9.1–12.0)

## 2012-09-25 ENCOUNTER — Other Ambulatory Visit: Payer: Self-pay | Admitting: *Deleted

## 2012-09-25 ENCOUNTER — Other Ambulatory Visit: Payer: Medicare Other

## 2012-09-25 DIAGNOSIS — Z7901 Long term (current) use of anticoagulants: Secondary | ICD-10-CM

## 2012-09-26 LAB — PROTIME-INR
INR: 2.1 — ABNORMAL HIGH (ref 0.8–1.2)
Prothrombin Time: 21.5 s — ABNORMAL HIGH (ref 9.1–12.0)

## 2012-10-01 IMAGING — CR DG CHEST 2V
2 series · 2 of 2 positions shown · non-contrast
Comparison: 09/11/2008

CLINICAL DATA: 89-year-old with arrhythmia.

CHEST - 2 VIEW

[w chest pa *]
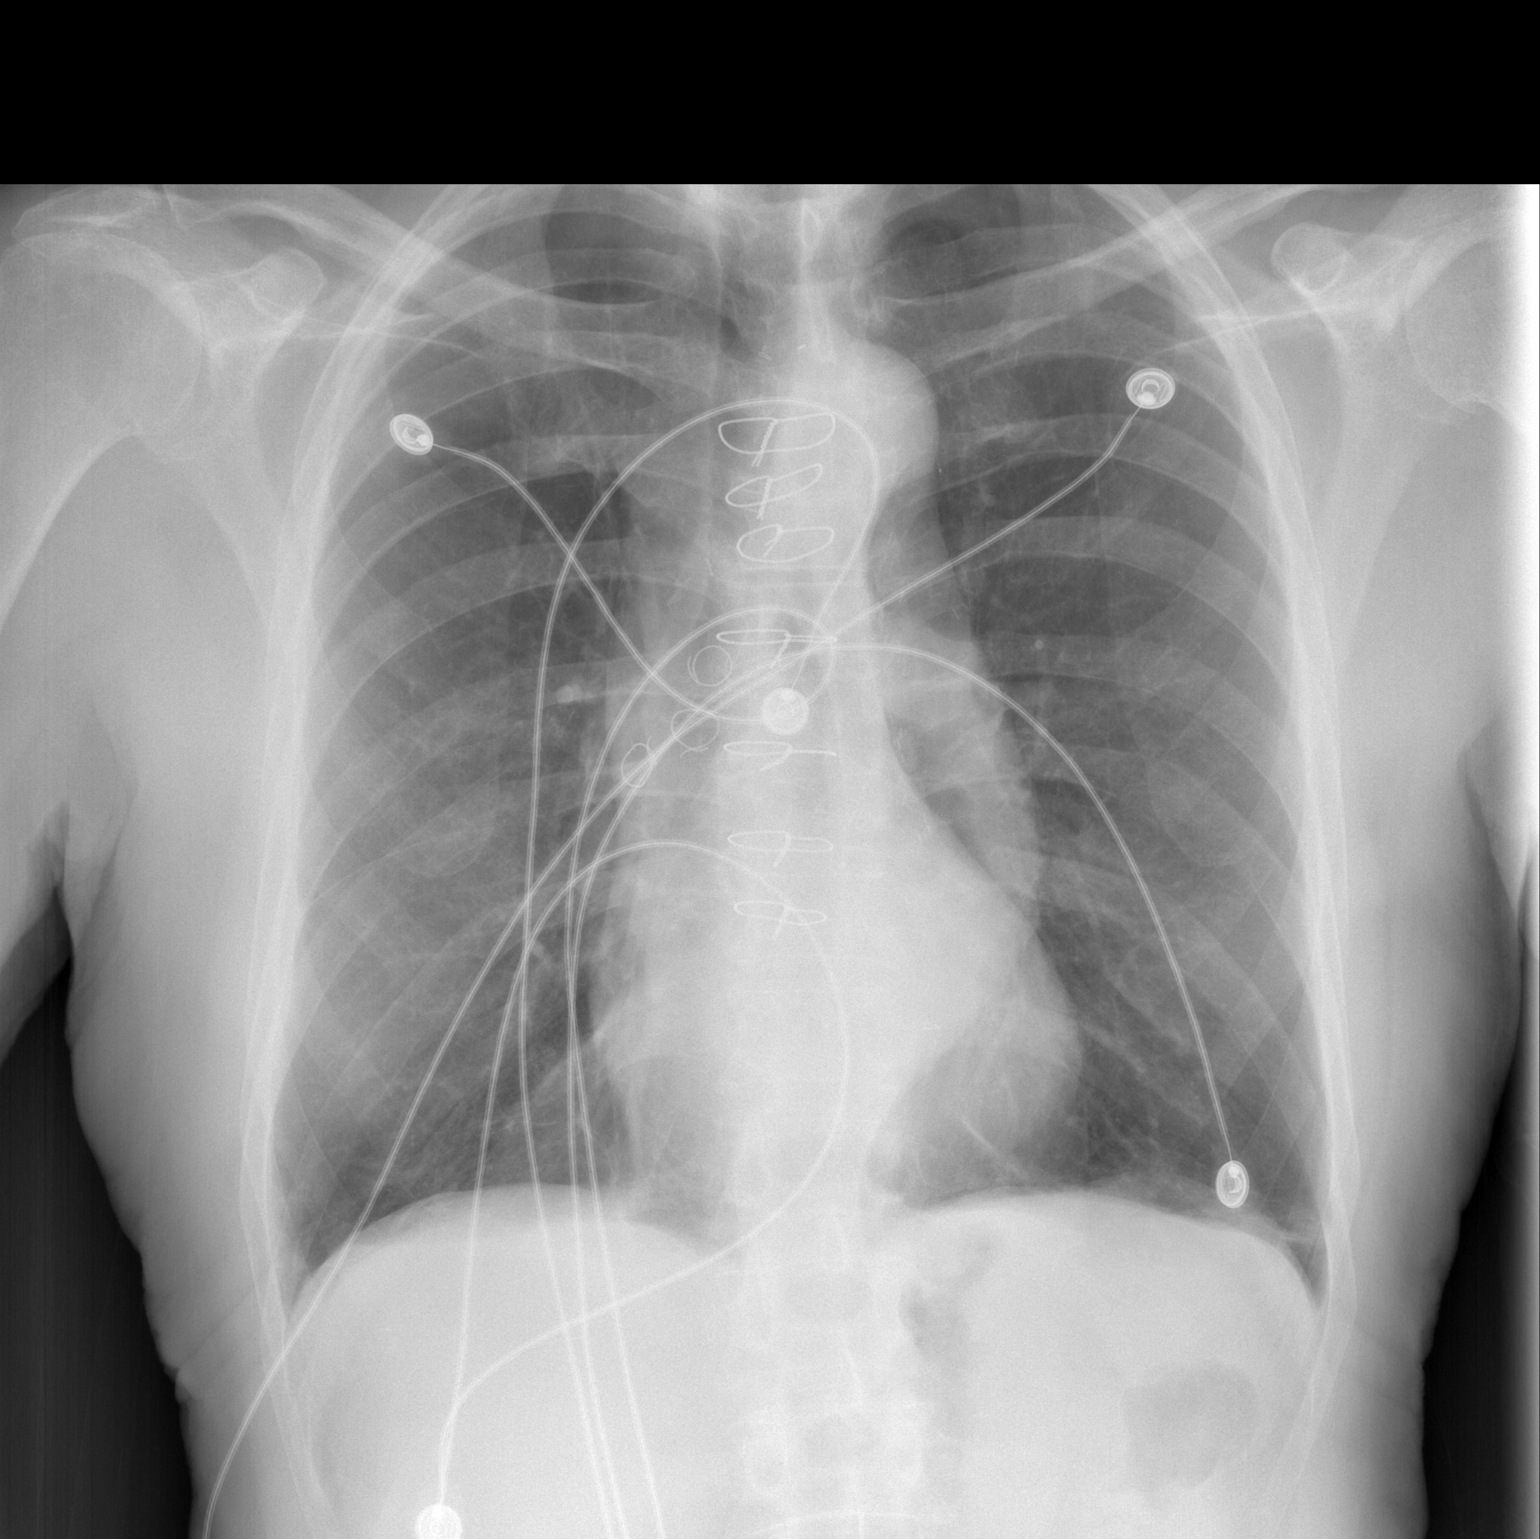

[w chest lat *]
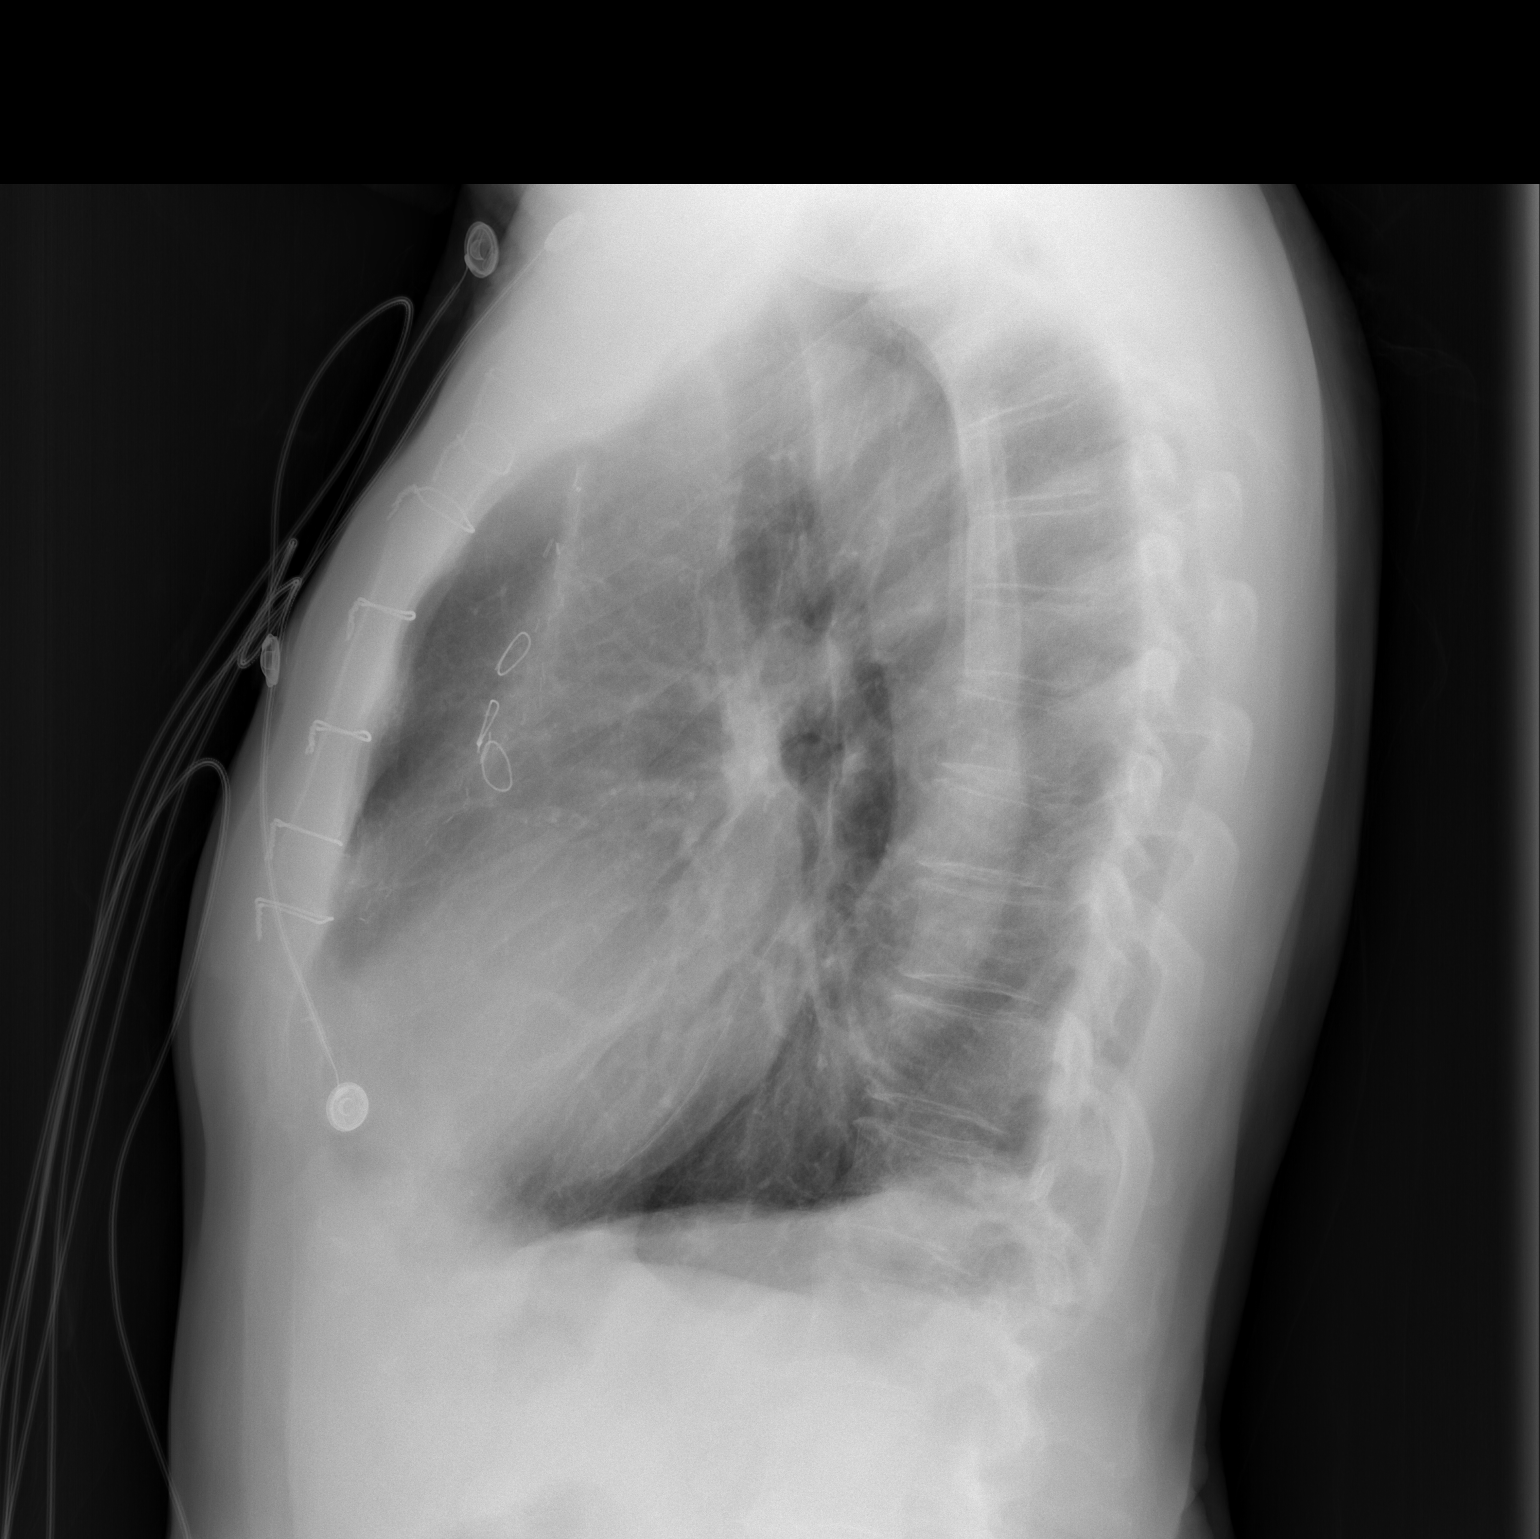

[2 of 2 positions shown; findings below may reference images not displayed]

FINDINGS: Two views of the chest demonstrate hyperinflation which
is most compatible with emphysema.  There is no focal airspace
disease.  Stable appearance of the heart and mediastinum.  Patient
is status post a median sternotomy procedure.  Osseous structures
are intact.
IMPRESSION: No acute chest findings.

Emphysematous changes.

## 2012-10-08 ENCOUNTER — Ambulatory Visit (INDEPENDENT_AMBULATORY_CARE_PROVIDER_SITE_OTHER): Payer: Medicare Other

## 2012-10-08 DIAGNOSIS — Z23 Encounter for immunization: Secondary | ICD-10-CM

## 2012-10-22 ENCOUNTER — Other Ambulatory Visit: Payer: Self-pay

## 2012-10-22 MED ORDER — MAGNESIUM OXIDE 400 MG PO TABS: 400.0000 mg | ORAL_TABLET | Freq: Every day | ORAL | Status: DC

## 2012-10-22 NOTE — Telephone Encounter (Signed)
Patient's wife called in indicating [facility] is waiting for a response from [place] to refill medication (MAGNESIUM), I informed patient's wife we have not received refill request from [facility], I will fill medication at this time.  I called [facility] to confirm perscriber of medication, medication was last filled 10/2011 by Dr.Reed, New RX sent electronically

## 2012-10-24 ENCOUNTER — Other Ambulatory Visit: Payer: Medicare Other

## 2012-10-24 DIAGNOSIS — I480 Paroxysmal atrial fibrillation: Secondary | ICD-10-CM

## 2012-10-24 DIAGNOSIS — Z7901 Long term (current) use of anticoagulants: Secondary | ICD-10-CM

## 2012-10-25 LAB — PROTIME-INR
INR: 2.2 — ABNORMAL HIGH (ref 0.8–1.2)
Prothrombin Time: 22.4 s — ABNORMAL HIGH (ref 9.1–12.0)

## 2012-11-04 ENCOUNTER — Other Ambulatory Visit: Payer: Self-pay | Admitting: *Deleted

## 2012-11-04 MED ORDER — WARFARIN SODIUM 1 MG PO TABS: 4.0000 mg | ORAL_TABLET | ORAL | Status: DC

## 2012-11-11 ENCOUNTER — Ambulatory Visit (INDEPENDENT_AMBULATORY_CARE_PROVIDER_SITE_OTHER): Payer: Medicare Other | Admitting: Internal Medicine

## 2012-11-11 ENCOUNTER — Encounter: Payer: Self-pay | Admitting: Internal Medicine

## 2012-11-11 ENCOUNTER — Other Ambulatory Visit: Payer: Self-pay | Admitting: Internal Medicine

## 2012-11-11 VITALS — BP 122/74 | HR 68 | Temp 97.5°F | Wt 202.0 lb

## 2012-11-11 DIAGNOSIS — Z7901 Long term (current) use of anticoagulants: Secondary | ICD-10-CM

## 2012-11-11 DIAGNOSIS — M1710 Unilateral primary osteoarthritis, unspecified knee: Secondary | ICD-10-CM

## 2012-11-11 DIAGNOSIS — E785 Hyperlipidemia, unspecified: Secondary | ICD-10-CM

## 2012-11-11 DIAGNOSIS — I4891 Unspecified atrial fibrillation: Secondary | ICD-10-CM

## 2012-11-11 DIAGNOSIS — IMO0002 Reserved for concepts with insufficient information to code with codable children: Secondary | ICD-10-CM

## 2012-11-11 DIAGNOSIS — I480 Paroxysmal atrial fibrillation: Secondary | ICD-10-CM

## 2012-11-11 DIAGNOSIS — F0150 Vascular dementia without behavioral disturbance: Secondary | ICD-10-CM

## 2012-11-11 DIAGNOSIS — I5022 Chronic systolic (congestive) heart failure: Secondary | ICD-10-CM

## 2012-11-11 DIAGNOSIS — M170 Bilateral primary osteoarthritis of knee: Secondary | ICD-10-CM

## 2012-11-11 DIAGNOSIS — I509 Heart failure, unspecified: Secondary | ICD-10-CM

## 2012-11-11 MED ORDER — WARFARIN SODIUM 1 MG PO TABS: 4.0000 mg | ORAL_TABLET | ORAL | Status: DC

## 2012-11-11 MED ORDER — TETANUS-DIPHTHERIA TOXOIDS TD 2-2 LF/0.5ML IM SUSP: 0.5000 mL | Freq: Once | INTRAMUSCULAR | Status: DC

## 2012-11-11 MED ORDER — FUROSEMIDE 20 MG PO TABS: 20.0000 mg | ORAL_TABLET | Freq: Every day | ORAL | Status: DC

## 2012-11-11 NOTE — Progress Notes (Signed)
Patient ID: Troy Freeman, male   DOB: Jan 28, 1920, 79 y.o.   MRN: [id number] Location:  [facility] / [facility]   Allergies  Allergen Reactions  . Aricept [Donepezil]     lethargy  . Codeine Nausea And Vomiting  . Penicillins Rash    Chief Complaint  Patient presents with  . Medical Managment of Chronic Issues    3 month f/u  . Immunizations    needs Tdap    HPI: Patient is a 79 y.o. male seen in the office today for medical management of chronic issues.  Pt. States he has no complaints. States his knees are still bothering him, using knee braces and those seem to offer some relief. Pt. States that sometimes when he stands he gets dizzy, so he tries to hold onto something. States that after a few minutes it goes away. Pt. Is HOH so he states that he likes watching sports on TV and every Tuesday he likes to go play bridge.   Review of Systems:  Review of Systems  Constitutional: Negative for fever, chills and weight loss.  HENT: Positive for hearing loss.   Respiratory: Negative for shortness of breath.        Can gets SOB with stair climbing  Cardiovascular: Negative for chest pain and leg swelling.  Gastrointestinal: Negative for nausea, vomiting, diarrhea and constipation.  Genitourinary: Negative for dysuria, urgency and frequency.  Musculoskeletal: Positive for joint pain. Negative for falls.       Knees  Neurological: Positive for dizziness.       See HPI  Psychiatric/Behavioral: Positive for memory loss. Negative for depression. The patient is not nervous/anxious.      Past Medical History  Diagnosis Date  . Bradycardia   . IHD (ischemic heart disease) July 2002    Large anterior MI with PCI to LAD with subsequent CABG x 6 in 2002  . LV dysfunction     EF is 30 to 35%; does not tolerate ACE due to cough and dizziness  . MI, old     OLD ANTERIOR MI  . Atrial fibrillation     managed on coumadin and with rate control  . S/P CABG  (coronary artery bypass graft) 2002  . Fall Aug 2012    with fractured right hip  . BPH (benign prostatic hyperplasia)   . GERD (gastroesophageal reflux disease)   . Dementia     mild  . Chronic anticoagulation   . Hyperlipidemia   . Hypertension   . Arthritis     Osteoarthritis  . Syncope 09/11/2008  . Osteoarthrosis, unspecified whether generalized or localized, unspecified site   . Long term (current) use of anticoagulants   . Congestive heart failure, unspecified   . Senile osteoporosis   . Other B-complex deficiencies   . Unspecified vitamin D deficiency   . Abnormality of gait   . Anemia, unspecified     Past Surgical History  Procedure Laterality Date  . Coronary angioplasty  07/2000    LAD  . Coronary artery bypass graft  08/2000    LIMA to LAD, SVG to RCA and PD, SVG to OM1 and OM2, and SVG to DX  . Right hip surgery Right 1996 & 2012    Replacement  . Left hip surgery Left 04/18/2005    Total replacement  . Knee arthroscopy Right     Arthroscopic surgery  . Eye surgery  2001    Cataract surgery  . Eye surgery  2006    Cataract surgery  . Esophagogastroduodenoscopy endoscopy    . Colon surgery  2004    Colonoscopy  . Colon surgery  10/29/2003    Colonoscopy    Social History:   reports that he quit smoking about 54 years ago. His smoking use included Cigarettes. He has a 30 pack-year smoking history. He does not have any smokeless tobacco history on file. He reports that he does not drink alcohol or use illicit drugs.  Family History  Problem Relation Age of Onset  . Cancer Mother   . Stroke Mother   . Heart disease Father   . Stroke Father   . Cancer Sister   . Cancer Brother   . Early death Brother     Medications: Patient's Medications  New Prescriptions   No medications on file  Previous Medications   ACETAMINOPHEN (TYLENOL 8 HOUR PO)    Take 1,000 mg by mouth 2 (two) times daily.    CALCIUM CARBONATE-VITAMIN D (CALCIUM + D PO)    Take 600  mg by mouth daily. 3 TABS DAILY    DIPTHERIA-TETANUS TOXOIDS (DECAVAC) 2-2 LF/0.5ML INJECTION    Inject 0.5 mLs into the muscle once.   FUROSEMIDE (LASIX) 20 MG TABLET    TAKE 1 TABLET ONCE DAILY.   MAGNESIUM OXIDE (MAG-OX) 400 MG TABLET    Take 1 tablet (400 mg total) by mouth daily.   OMEPRAZOLE (PRILOSEC) 20 MG CAPSULE    Take 1 capsule (20 mg total) by mouth daily.   POTASSIUM CHLORIDE (MICRO-K) 10 MEQ CR CAPSULE    Take 10 mEq by mouth daily. Take only on days that Lasix is taken.   SIMVASTATIN (ZOCOR) 40 MG TABLET    Take 40 mg by mouth at bedtime.     WARFARIN (COUMADIN) 1 MG TABLET    Take 4 tablets (4 mg total) by mouth as directed. Take 4mg  each Friday and Monday. Take 3mg  on Tuesday, Wednesday, Thursday, Saturday, and Sunday.  Modified Medications   No medications on file  Discontinued Medications   No medications on file     Physical Exam: Filed Vitals:   11/11/12 1124  BP: 122/74  Pulse: 68  Temp: 97.5 F (36.4 C)  TempSrc: Oral  Weight: 202 lb (91.627 kg)  SpO2: 97%   Physical Exam  Constitutional: He is oriented to person, place, and time. He appears well-developed and well-nourished.  Cardiovascular: Normal rate, regular rhythm, normal heart sounds and intact distal pulses.   Pulmonary/Chest: Effort normal and breath sounds normal.  Neurological: He is alert and oriented to person, place, and time.  Skin: Skin is warm and dry.  Psychiatric: He has a normal mood and affect.   Labs reviewed: Basic Metabolic Panel:  Recent Labs  14/78/29 0916 08/12/12 1133  NA 135 134  K 4.3 4.4  CL 97 97  CO2 23 23  GLUCOSE 85 84  BUN 13 14  CREATININE 1.04 1.01  CALCIUM 9.5 9.7   Liver Function Tests:  Recent Labs  04/09/12 0916  AST 26  ALT 16  ALKPHOS 54  BILITOT 0.7  PROT 6.6   CBC:  Recent Labs  04/09/12 0916 08/12/12 1133  WBC 5.5 5.9  NEUTROABS 3.3 3.8  HGB 14.6 14.0  HCT 41.7 41.1  MCV 91 92   Lipid Panel:  Recent Labs  04/09/12 0916   HDL 47  LDLCALC 71  TRIG 137  CHOLHDL 3.1   Assessment/Plan 1. Vascular dementia -has been stable,  no recent changes in cognition -his wife helps him with reminders for bathing, dressing, cooks for him, gives him medications -cont aricept and namenda -walking with walker--no recent falls  2. Hyperlipidemia LDL goal < 100 -check flp before next appt (will be one year from last check and his wife does not want it checked more often than annually)  3. PAF (paroxysmal atrial fibrillation) -rate controlled at this time, continues on coumadin, followed here  4. Long term (current) use of anticoagulants -last INR 10/16 was 2.2 and dosage was continued  -his wife requests that he always get 135 pills which is the best deal financially for them  5. Osteoarthritis of both knees -stable with tylenol, braces, occasional steroid injections as needed  6. Systolic CHF, chronic -follows with cardiology, stable, no acute exacerbations in a long long time  Labs/tests ordered:  Cbc, cmp, flp before (likely will get INR at the same time at that point, but gets monthly) Next appt:  6 mos EV

## 2012-11-26 ENCOUNTER — Other Ambulatory Visit: Payer: Medicare Other

## 2012-11-26 DIAGNOSIS — I480 Paroxysmal atrial fibrillation: Secondary | ICD-10-CM

## 2012-11-26 DIAGNOSIS — Z7901 Long term (current) use of anticoagulants: Secondary | ICD-10-CM

## 2012-11-27 LAB — PROTIME-INR
INR: 2.2 — ABNORMAL HIGH (ref 0.8–1.2)
Prothrombin Time: 22.9 s — ABNORMAL HIGH (ref 9.1–12.0)

## 2012-11-29 ENCOUNTER — Telehealth: Payer: Self-pay

## 2012-11-29 NOTE — Telephone Encounter (Signed)
Need to know what last INR was;when was the last INR checked; what is the current dose; why is the pt on coumadin

## 2012-11-29 NOTE — Telephone Encounter (Signed)
Okay cont dose and recheck in 4 weeks

## 2012-11-29 NOTE — Telephone Encounter (Signed)
Call came in requesting lab results/ instructions for PT/INR. I reviewed labs PT/INR was therapeutic, patient instructed to keep medications the same and recheck in 4 weeks. Message will be forwarded to covering provider to confirm agreement with instructions.   Please confirm or re-advise on PT/INR drawn on 11/26/2012

## 2012-11-29 NOTE — Telephone Encounter (Signed)
Last PT/INR is in Epic please see lab tab (all previous PT/INR's are listed side by side)  Current dose:  Take 4mg  each Friday and Monday. Take 3mg  on Tuesday, Wednesday, Thursday, Saturday, and Sunday.

## 2012-12-25 ENCOUNTER — Encounter: Payer: Self-pay | Admitting: *Deleted

## 2012-12-25 ENCOUNTER — Other Ambulatory Visit: Payer: Medicare Other

## 2012-12-25 DIAGNOSIS — I4891 Unspecified atrial fibrillation: Secondary | ICD-10-CM

## 2012-12-25 NOTE — Telephone Encounter (Signed)
Opened in error

## 2012-12-26 ENCOUNTER — Ambulatory Visit (INDEPENDENT_AMBULATORY_CARE_PROVIDER_SITE_OTHER): Payer: Medicare Other | Admitting: Cardiology

## 2012-12-26 ENCOUNTER — Encounter: Payer: Self-pay | Admitting: Cardiology

## 2012-12-26 VITALS — BP 122/82 | HR 76 | Ht 74.0 in | Wt 203.8 lb

## 2012-12-26 DIAGNOSIS — I10 Essential (primary) hypertension: Secondary | ICD-10-CM

## 2012-12-26 DIAGNOSIS — I255 Ischemic cardiomyopathy: Secondary | ICD-10-CM

## 2012-12-26 DIAGNOSIS — I2589 Other forms of chronic ischemic heart disease: Secondary | ICD-10-CM

## 2012-12-26 DIAGNOSIS — I252 Old myocardial infarction: Secondary | ICD-10-CM

## 2012-12-26 DIAGNOSIS — I4891 Unspecified atrial fibrillation: Secondary | ICD-10-CM

## 2012-12-26 LAB — PROTIME-INR
INR: 2.2 — ABNORMAL HIGH (ref 0.8–1.2)
Prothrombin Time: 23.2 s — ABNORMAL HIGH (ref 9.1–12.0)

## 2012-12-26 NOTE — Patient Instructions (Signed)
Continue your current therapy  I will see you in 6 months.   

## 2012-12-26 NOTE — Progress Notes (Signed)
Troy Freeman Date of Birth: 1920-03-15 Medical Record #[id number]  History of Present Illness: [name] is seen for a followup visit. He is 89. He has known CAD with LV dysfunction. He has been managed conservatively. His other problems includes chronic atrial fib, coumadin therapy and progressive dementia. His wife provides around the clock care for him. He is quite hard of hearing.  On followup today he reports he has done very well over the past 6 months. He has a history of severe bradycardia on beta blockers and hypotension on ACEi/ARB therapy. No increased SOB or edema. No dizzyness or syncope. Denies chest pain.  Current Outpatient Prescriptions on File Prior to Visit  Medication Sig Dispense Refill  . Acetaminophen (TYLENOL 8 HOUR PO) Take 1,000 mg by mouth 2 (two) times daily.       . Calcium Carbonate-Vitamin D (CALCIUM + D PO) Take 600 mg by mouth daily. 3 TABS DAILY       . magnesium oxide (MAG-OX) 400 MG tablet Take 1 tablet (400 mg total) by mouth daily.  90 tablet  1  . omeprazole (PRILOSEC) 20 MG capsule Take 1 capsule (20 mg total) by mouth daily.  30 capsule  5  . potassium chloride (MICRO-K) 10 MEQ CR capsule Take 10 mEq by mouth daily. Take only on days that Lasix is taken.      . simvastatin (ZOCOR) 40 MG tablet Take 40 mg by mouth at bedtime.        Marland Kitchen warfarin (COUMADIN) 1 MG tablet Take 4 tablets (4 mg total) by mouth as directed. Take 4mg  each Friday and Monday. Take 3mg  on Tuesday, Wednesday, Thursday, Saturday, and Sunday.  135 tablet  5   No current facility-administered medications on file prior to visit.    Allergies  Allergen Reactions  . Aricept [Donepezil]     lethargy  . Codeine Nausea And Vomiting  . Coreg [Carvedilol] Other (See Comments)    Bradycardia   . Penicillins Rash  . Tramadol Nausea Only    Past Medical History  Diagnosis Date  . Bradycardia   . IHD (ischemic heart disease) July 2002    Large anterior MI with PCI to LAD  with subsequent CABG x 6 in 2002  . LV dysfunction     EF is 30 to 35%; does not tolerate ACE due to cough and dizziness  . MI, old     OLD ANTERIOR MI  . Atrial fibrillation     managed on coumadin and with rate control  . S/P CABG (coronary artery bypass graft) 2002  . Fall Aug 2012    with fractured right hip  . BPH (benign prostatic hyperplasia)   . GERD (gastroesophageal reflux disease)   . Dementia     mild  . Chronic anticoagulation   . Hyperlipidemia   . Hypertension   . Arthritis     Osteoarthritis  . Syncope 09/11/2008  . Osteoarthrosis, unspecified whether generalized or localized, unspecified site   . Long term (current) use of anticoagulants   . Congestive heart failure, unspecified   . Senile osteoporosis   . Other B-complex deficiencies   . Unspecified vitamin D deficiency   . Abnormality of gait   . Anemia, unspecified     Past Surgical History  Procedure Laterality Date  . Coronary angioplasty  07/2000    LAD  . Coronary artery bypass graft  08/2000    LIMA to LAD, SVG to RCA and PD, SVG to  OM1 and OM2, and SVG to DX  . Right hip surgery Right 1996 & 2012    Replacement  . Left hip surgery Left 04/18/2005    Total replacement  . Knee arthroscopy Right     Arthroscopic surgery  . Eye surgery  2001    Cataract surgery  . Eye surgery  2006    Cataract surgery  . Esophagogastroduodenoscopy endoscopy    . Colon surgery  2004    Colonoscopy  . Colon surgery  10/29/2003    Colonoscopy    History  Smoking status  . Former Smoker -- 1.00 packs/day for 30 years  . Types: Cigarettes  . Quit date: 01/09/1958  Smokeless tobacco  . Not on file    History  Alcohol Use No    Family History  Problem Relation Age of Onset  . Cancer Mother   . Stroke Mother   . Heart disease Father   . Stroke Father   . Cancer Sister   . Cancer Brother   . Early death Brother     Review of Systems: The review of systems is per the HPI.  All other systems were  reviewed and are negative.  Physical Exam: BP 122/82  Pulse 76  Ht 6\' 2"  (1.88 m)  Wt 203 lb 12.8 oz (92.443 kg)  BMI 26.16 kg/m2 Patient is very pleasant and in no acute distress. He is quite hard of hearing. kin is warm and dry. Color is normal.  HEENT is unremarkable. Normocephalic/atraumatic. PERRL. Sclera are nonicteric. Neck is supple. No masses. No JVD. Lungs are clear. Cardiac exam shows an irregular rhythm. His rate is controlled. Old sternotomy scar. Normal S1-2 without murmur. Abdomen is soft. Extremities are without edema. Gait and ROM are intact. He is using a walker. No gross neurologic deficits noted.  LABORATORY DATA:   Ecg shows atrial fibrillation with rate 76 bpm. RAD, old septal MI.   Lab Results  Component Value Date   WBC 5.9 08/12/2012   HGB 14.0 08/12/2012   HCT 41.1 08/12/2012   PLT 252.0 09/20/2011   GLUCOSE 84 08/12/2012   CHOL 145 08/21/2010   TRIG 137 04/09/2012   HDL 47 04/09/2012   LDLCALC 71 04/09/2012   ALT 16 04/09/2012   AST 26 04/09/2012   NA 134 08/12/2012   K 4.4 08/12/2012   CL 97 08/12/2012   CREATININE 1.01 08/12/2012   BUN 14 08/12/2012   CO2 23 08/12/2012   TSH 3.488 08/21/2010   INR 2.2* 12/25/2012     Assessment / Plan: 1. Bradycardia -resolved since beta blocker discontinued.   2. CAD -s/p CABG- no chest pain.  3. Atrial fib - rate is controlled. He is on chronic anticoagulation with coumadin. INR therapeutic.   4. Ischemic cardiomyopathy EF 35%. Asymptomatic. Intolerant of meds as noted above. Continue to monitor.

## 2013-01-13 ENCOUNTER — Encounter: Payer: Self-pay | Admitting: Internal Medicine

## 2013-01-27 ENCOUNTER — Other Ambulatory Visit: Payer: Self-pay | Admitting: *Deleted

## 2013-01-27 ENCOUNTER — Other Ambulatory Visit: Payer: Medicare Other

## 2013-01-27 DIAGNOSIS — Z0189 Encounter for other specified special examinations: Secondary | ICD-10-CM

## 2013-01-28 LAB — PROTIME-INR
INR: 2.2 — ABNORMAL HIGH (ref 0.8–1.2)
Prothrombin Time: 22.9 s — ABNORMAL HIGH (ref 9.1–12.0)

## 2013-02-05 ENCOUNTER — Other Ambulatory Visit: Payer: Self-pay | Admitting: Internal Medicine

## 2013-02-08 ENCOUNTER — Emergency Department (HOSPITAL_COMMUNITY)
Admission: EM | Admit: 2013-02-08 | Discharge: 2013-02-08 | Disposition: A | Discharge status: Home / Self Care | Payer: Medicare Other | Attending: Emergency Medicine | Admitting: Emergency Medicine

## 2013-02-08 ENCOUNTER — Emergency Department (HOSPITAL_COMMUNITY): Payer: Medicare Other

## 2013-02-08 ENCOUNTER — Encounter (HOSPITAL_COMMUNITY): Payer: Self-pay | Admitting: Emergency Medicine

## 2013-02-08 DIAGNOSIS — Z79899 Other long term (current) drug therapy: Secondary | ICD-10-CM | POA: Insufficient documentation

## 2013-02-08 DIAGNOSIS — Z87891 Personal history of nicotine dependence: Secondary | ICD-10-CM | POA: Insufficient documentation

## 2013-02-08 DIAGNOSIS — I4891 Unspecified atrial fibrillation: Secondary | ICD-10-CM | POA: Insufficient documentation

## 2013-02-08 DIAGNOSIS — I252 Old myocardial infarction: Secondary | ICD-10-CM | POA: Insufficient documentation

## 2013-02-08 DIAGNOSIS — F0390 Unspecified dementia without behavioral disturbance: Secondary | ICD-10-CM | POA: Insufficient documentation

## 2013-02-08 DIAGNOSIS — Z8739 Personal history of other diseases of the musculoskeletal system and connective tissue: Secondary | ICD-10-CM | POA: Insufficient documentation

## 2013-02-08 DIAGNOSIS — Z862 Personal history of diseases of the blood and blood-forming organs and certain disorders involving the immune mechanism: Secondary | ICD-10-CM | POA: Insufficient documentation

## 2013-02-08 DIAGNOSIS — Z951 Presence of aortocoronary bypass graft: Secondary | ICD-10-CM | POA: Insufficient documentation

## 2013-02-08 DIAGNOSIS — E785 Hyperlipidemia, unspecified: Secondary | ICD-10-CM | POA: Insufficient documentation

## 2013-02-08 DIAGNOSIS — R531 Weakness: Secondary | ICD-10-CM

## 2013-02-08 DIAGNOSIS — I10 Essential (primary) hypertension: Secondary | ICD-10-CM | POA: Insufficient documentation

## 2013-02-08 DIAGNOSIS — R5383 Other fatigue: Principal | ICD-10-CM

## 2013-02-08 DIAGNOSIS — J209 Acute bronchitis, unspecified: Secondary | ICD-10-CM | POA: Insufficient documentation

## 2013-02-08 DIAGNOSIS — Z7901 Long term (current) use of anticoagulants: Secondary | ICD-10-CM | POA: Insufficient documentation

## 2013-02-08 DIAGNOSIS — Z87448 Personal history of other diseases of urinary system: Secondary | ICD-10-CM | POA: Insufficient documentation

## 2013-02-08 DIAGNOSIS — R5381 Other malaise: Secondary | ICD-10-CM | POA: Insufficient documentation

## 2013-02-08 DIAGNOSIS — J208 Acute bronchitis due to other specified organisms: Secondary | ICD-10-CM

## 2013-02-08 DIAGNOSIS — K219 Gastro-esophageal reflux disease without esophagitis: Secondary | ICD-10-CM | POA: Insufficient documentation

## 2013-02-08 DIAGNOSIS — Z880 Allergy status to penicillin: Secondary | ICD-10-CM | POA: Insufficient documentation

## 2013-02-08 DIAGNOSIS — I509 Heart failure, unspecified: Secondary | ICD-10-CM | POA: Insufficient documentation

## 2013-02-08 LAB — INFLUENZA PANEL BY PCR (TYPE A & B)
H1N1 flu by pcr: NOT DETECTED
INFLAPCR: POSITIVE — AB
Influenza B By PCR: NEGATIVE

## 2013-02-08 LAB — TROPONIN I: Troponin I: <0.3 ng/mL (ref <0.30)

## 2013-02-08 LAB — CBC WITH DIFFERENTIAL/PLATELET
Basophils Absolute: 0 10*3/uL (ref 0.0–0.1)
Basophils Relative: 0 % (ref 0–1)
Eosinophils Absolute: 0 10*3/uL (ref 0.0–0.7)
Eosinophils Relative: 0 % (ref 0–5)
HEMATOCRIT: 37 % — AB (ref 39.0–52.0)
Hemoglobin: 12.9 g/dL — ABNORMAL LOW (ref 13.0–17.0)
LYMPHS PCT: 15 % (ref 12–46)
Lymphs Abs: 0.7 10*3/uL (ref 0.7–4.0)
MCH: 31.7 pg (ref 26.0–34.0)
MCHC: 34.9 g/dL (ref 30.0–36.0)
MCV: 90.9 fL (ref 78.0–100.0)
MONO ABS: 1.2 10*3/uL — AB (ref 0.1–1.0)
MONOS PCT: 25 % — AB (ref 3–12)
Neutro Abs: 2.7 10*3/uL (ref 1.7–7.7)
Neutrophils Relative %: 59 % (ref 43–77)
Platelets: 223 10*3/uL (ref 150–400)
RBC: 4.07 MIL/uL — ABNORMAL LOW (ref 4.22–5.81)
RDW: 12.9 % (ref 11.5–15.5)
WBC: 4.7 10*3/uL (ref 4.0–10.5)

## 2013-02-08 LAB — URINALYSIS, ROUTINE W REFLEX MICROSCOPIC
Bilirubin Urine: NEGATIVE
GLUCOSE, UA: NEGATIVE mg/dL
HGB URINE DIPSTICK: NEGATIVE
Ketones, ur: NEGATIVE mg/dL
LEUKOCYTES UA: NEGATIVE
Nitrite: NEGATIVE
Protein, ur: 30 mg/dL — AB
Specific Gravity, Urine: 1.03 (ref 1.005–1.030)
Urobilinogen, UA: 0.2 mg/dL (ref 0.0–1.0)
pH: 6 (ref 5.0–8.0)

## 2013-02-08 LAB — PRO B NATRIURETIC PEPTIDE: Pro B Natriuretic peptide (BNP): 2988 pg/mL — ABNORMAL HIGH (ref 0–450)

## 2013-02-08 LAB — PROTIME-INR
INR: 1.89 — ABNORMAL HIGH (ref 0.00–1.49)
Prothrombin Time: 21.1 seconds — ABNORMAL HIGH (ref 11.6–15.2)

## 2013-02-08 LAB — BASIC METABOLIC PANEL
BUN: 14 mg/dL (ref 6–23)
CO2: 23 meq/L (ref 19–32)
CREATININE: 0.85 mg/dL (ref 0.50–1.35)
Calcium: 9 mg/dL (ref 8.4–10.5)
Chloride: 95 mEq/L — ABNORMAL LOW (ref 96–112)
GFR calc Af Amer: 86 mL/min — ABNORMAL LOW (ref >90)
GFR calc non Af Amer: 74 mL/min — ABNORMAL LOW (ref >90)
Glucose, Bld: 100 mg/dL — ABNORMAL HIGH (ref 70–99)
Potassium: 4.2 mEq/L (ref 3.7–5.3)
Sodium: 131 mEq/L — ABNORMAL LOW (ref 137–147)

## 2013-02-08 LAB — URINE MICROSCOPIC-ADD ON

## 2013-02-08 LAB — POCT I-STAT TROPONIN I
Troponin i, poc: 0.01 ng/mL (ref 0.00–0.08)
Troponin i, poc: 0.02 ng/mL (ref 0.00–0.08)

## 2013-02-08 MED ORDER — ALBUTEROL SULFATE (2.5 MG/3ML) 0.083% IN NEBU
5.0000 mg | INHALATION_SOLUTION | RESPIRATORY_TRACT | Status: AC | PRN
  Administered 2013-02-08 (×3): 5 mg via RESPIRATORY_TRACT
  Filled 2013-02-08 (×3): qty 6

## 2013-02-08 MED ORDER — FUROSEMIDE 10 MG/ML IJ SOLN
20.0000 mg | Freq: Once | INTRAMUSCULAR | Status: AC
  Administered 2013-02-08: 20 mg via INTRAVENOUS
  Filled 2013-02-08: qty 4

## 2013-02-08 MED ORDER — ALBUTEROL SULFATE HFA 108 (90 BASE) MCG/ACT IN AERS: 1.0000 | INHALATION_SPRAY | Freq: Four times a day (QID) | RESPIRATORY_TRACT | Status: DC | PRN

## 2013-02-08 MED ORDER — IPRATROPIUM BROMIDE 0.02 % IN SOLN
0.5000 mg | Freq: Once | RESPIRATORY_TRACT | Status: AC
  Administered 2013-02-08: 0.5 mg via RESPIRATORY_TRACT
  Filled 2013-02-08: qty 2.5

## 2013-02-08 MED ORDER — OSELTAMIVIR PHOSPHATE 75 MG PO CAPS
75.0000 mg | ORAL_CAPSULE | Freq: Once | ORAL | Status: AC
  Administered 2013-02-08: 75 mg via ORAL
  Filled 2013-02-08: qty 1

## 2013-02-08 MED ORDER — PREDNISONE 50 MG PO TABS: 50.0000 mg | ORAL_TABLET | Freq: Every day | ORAL | Status: DC

## 2013-02-08 NOTE — ED Notes (Signed)
Bed: GG26 Expected date: 02/08/13 Expected time: 10:10 AM Means of arrival: Ambulance Comments: Weakness, Shob

## 2013-02-08 NOTE — ED Notes (Signed)
Pt comes from home by EMS for generalized weakness that started last night around 10pm last night.  Pt normally gets around house with walker and last night he had lots of difficulty getting out of the chair to go to bed and then during the night trouble getting up to bathroom. Pt was having to use a urinal and is darker in color per pt's wife.  Pt did ambulate for EMS out of house and to ambulance with his walker. Pt does have PMH CHF, afib and some dementia.

## 2013-02-08 NOTE — ED Notes (Signed)
Pt ambulated from room 5 to hallway O2 sat on room air started at 94% with heart rate at 69. At the end of ambulation patient Sat was 93% on room air with heart rate of 77. Per wife gait was at baseline. He does stretch walker out in front of him, she reports that this is an everyday thing. Pt denies weakness or shortness of breath during ambulation.

## 2013-02-08 NOTE — ED Notes (Signed)
MD at bedside. 

## 2013-02-08 NOTE — ED Notes (Signed)
Called Respiratory for breathing treatments and peak flow.

## 2013-02-08 NOTE — Discharge Instructions (Signed)
Please take the lasix everyday starting tomorrow, until seen by Cardiology. Please take the meds prescribe.d Return to the ER immediately if symptoms get worse.   Acute Bronchitis Bronchitis is inflammation of the airways that extend from the windpipe into the lungs (bronchi). The inflammation often causes mucus to develop. This leads to a cough, which is the most common symptom of bronchitis.  In acute bronchitis, the condition usually develops suddenly and goes away over time, usually in a couple weeks. Smoking, allergies, and asthma can make bronchitis worse. Repeated episodes of bronchitis may cause further lung problems.  CAUSES Acute bronchitis is most often caused by the same virus that causes a cold. The virus can spread from person to person (contagious).  SIGNS AND SYMPTOMS   Cough.   Fever.   Coughing up mucus.   Body aches.   Chest congestion.   Chills.   Shortness of breath.   Sore throat.  DIAGNOSIS  Acute bronchitis is usually diagnosed through a physical exam. Tests, such as chest X-rays, are sometimes done to rule out other conditions.  TREATMENT  Acute bronchitis usually goes away in a couple weeks. Often times, no medical treatment is necessary. Medicines are sometimes given for relief of fever or cough. Antibiotics are usually not needed but may be prescribed in certain situations. In some cases, an inhaler may be recommended to help reduce shortness of breath and control the cough. A cool mist vaporizer may also be used to help thin bronchial secretions and make it easier to clear the chest.  HOME CARE INSTRUCTIONS  Get plenty of rest.   Drink enough fluids to keep your urine clear or pale yellow (unless you have a medical condition that requires fluid restriction). Increasing fluids may help thin your secretions and will prevent dehydration.   Only take over-the-counter or prescription medicines as directed by your health care provider.   Avoid  smoking and secondhand smoke. Exposure to cigarette smoke or irritating chemicals will make bronchitis worse. If you are a smoker, consider using nicotine gum or skin patches to help control withdrawal symptoms. Quitting smoking will help your lungs heal faster.   Reduce the chances of another bout of acute bronchitis by washing your hands frequently, avoiding people with cold symptoms, and trying not to touch your hands to your mouth, nose, or eyes.   Follow up with your health care provider as directed.  SEEK MEDICAL CARE IF: Your symptoms do not improve after 1 week of treatment.  SEEK IMMEDIATE MEDICAL CARE IF:  You develop an increased fever or chills.   You have chest pain.   You have severe shortness of breath.  You have bloody sputum.   You develop dehydration.  You develop fainting.  You develop repeated vomiting.  You develop a severe headache. MAKE SURE YOU:   Understand these instructions.  Will watch your condition.  Will get help right away if you are not doing well or get worse. Document Released: 02/03/2004 Document Revised: 08/28/2012 Document Reviewed: 06/18/2012 [facility] Patient Information 2014 [place].  Heart Failure Heart failure means your heart has trouble pumping blood. This makes it hard for your body to work well. Heart failure is usually a long-term (chronic) condition. You must take good care of yourself and follow your doctor's treatment plan. HOME CARE  Take your heart medicine as told by your doctor.  Do not stop taking medicine unless your doctor tells you to.  Do not skip any dose of medicine.  Refill  your medicines before they run out.  Take other medicines only as told by your doctor or pharmacist.  Stay active if told by your doctor. The elderly and people with severe heart failure should talk with a doctor about physical activity.  Eat heart healthy foods. Choose foods that are without trans fat and are low in  saturated fat, cholesterol, and salt (sodium). This includes fresh or frozen fruits and vegetables, fish, lean meats, fat-free or low-fat dairy foods, whole grains, and high-fiber foods. Lentils and dried peas and beans (legumes) are also good choices.  Limit salt if told by your doctor.  Cook in a healthy way. Roast, grill, broil, bake, poach, steam, or stir-fry foods.  Limit fluids as told by your doctor.  Weigh yourself every morning. Do this after you pee (urinate) and before you eat breakfast. Write down your weight to give to your doctor.  Take your blood pressure and write it down if your doctor tell you to.  Ask your doctor how to check your pulse. Check your pulse as told.  Lose weight if told by your doctor.  Stop smoking or chewing tobacco. Do not use gum or patches that help you quit without your doctor's approval.  Schedule and go to doctor visits as told.  Nonpregnant women should have no more than 1 drink a day. Men should have no more than 2 drinks a day. Talk to your doctor about drinking alcohol.  Stop illegal drug use.  Stay current with shots (immunizations).  Manage your health conditions as told by your doctor.  Learn to manage your stress.  Rest when you are tired.  If it is really hot outside:  Avoid intense activities.  Use air conditioning or fans, or get in a cooler place.  Avoid caffeine and alcohol.  Wear loose-fitting, lightweight, and light-colored clothing.  If it is really cold outside:  Avoid intense activities.  Layer your clothing.  Wear mittens or gloves, a hat, and a scarf when going outside.  Avoid alcohol.  Learn about heart failure and get support as needed.  Get help to maintain or improve your quality of life and your ability to care for yourself as needed. GET HELP IF:   You gain 03 lb/1.4 kg or more in 1 day or 05 lb/2.3 kg in a week.  You are more short of breath than usual.  You cannot do your normal  activities.  You tire easily.  You cough more than normal, especially with activity.  You have any or more puffiness (swelling) in areas such as your hands, feet, ankles, or belly (abdomen).  You cannot sleep because it is hard to breathe.  You feel like your heart is beating fast (palpitations).  You get dizzy or lightheaded when you stand up. GET HELP RIGHT AWAY IF:   You have trouble breathing.  There is a change in mental status, such as becoming less alert or not being able to focus.  You have chest pain or discomfort.  You faint. MAKE SURE YOU:   Understand these instructions.  Will watch your condition.  Will get help right away if you are not doing well or get worse. Document Released: 10/05/2007 Document Revised: 04/22/2012 Document Reviewed: 07/27/2011 [facility] Patient Information 2014 [place], [place].

## 2013-02-08 NOTE — ED Notes (Signed)
RT states patient completed treatment. RN Bryson Corona will be notified.

## 2013-02-08 NOTE — ED Notes (Signed)
Awaiting influenza PCr from lab

## 2013-02-08 NOTE — ED Provider Notes (Addendum)
CSN: [id number]     Arrival date & time 02/08/13  1007 History   First MD Initiated Contact with Patient 02/08/13 1012     Chief Complaint  Patient presents with  . Weakness    generalized   (Consider location/radiation/quality/duration/timing/severity/associated sxs/prior Treatment) HPI Comments: 80 Y/O MALE with hx of atrial fibrillation on coumadin, Ischemic cardiomyopathy with EF 30% comes in to the ER with cc of weakness. Per wife, patient started feeling unwell y'day. He was feeling weak and had some trouble getting out of the chair  -which got worse this AM when he was unable to get up at all from the bed. Pt had some dib and wheezing. Pt denies nausea, emesis, fevers, chills, chest pains, headaches, abdominal pain, uti like symptoms. He has a non productive cough. CAD is medically managed, and lasix are taken if there is weight gain of more than 3 lbs - which has not been the case.   The history is provided by the patient.    Past Medical History  Diagnosis Date  . Bradycardia   . IHD (ischemic heart disease) July 2002    Large anterior MI with PCI to LAD with subsequent CABG x 6 in 2002  . LV dysfunction     EF is 30 to 35%; does not tolerate ACE due to cough and dizziness  . MI, old     OLD ANTERIOR MI  . Atrial fibrillation     managed on coumadin and with rate control  . S/P CABG (coronary artery bypass graft) 2002  . Fall Aug 2012    with fractured right hip  . BPH (benign prostatic hyperplasia)   . GERD (gastroesophageal reflux disease)   . Dementia     mild  . Chronic anticoagulation   . Hyperlipidemia   . Hypertension   . Arthritis     Osteoarthritis  . Syncope 09/11/2008  . Osteoarthrosis, unspecified whether generalized or localized, unspecified site   . Long term (current) use of anticoagulants   . Congestive heart failure, unspecified   . Senile osteoporosis   . Other B-complex deficiencies   . Unspecified vitamin D deficiency   . Abnormality of gait    . Anemia, unspecified    Past Surgical History  Procedure Laterality Date  . Coronary angioplasty  07/2000    LAD  . Coronary artery bypass graft  08/2000    LIMA to LAD, SVG to RCA and PD, SVG to OM1 and OM2, and SVG to DX  . Right hip surgery Right 1996 & 2012    Replacement  . Left hip surgery Left 04/18/2005    Total replacement  . Knee arthroscopy Right     Arthroscopic surgery  . Eye surgery  2001    Cataract surgery  . Eye surgery  2006    Cataract surgery  . Esophagogastroduodenoscopy endoscopy    . Colon surgery  2004    Colonoscopy  . Colon surgery  10/29/2003    Colonoscopy   Family History  Problem Relation Age of Onset  . Cancer Mother   . Stroke Mother   . Heart disease Father   . Stroke Father   . Cancer Sister   . Cancer Brother   . Early death Brother    History  Substance Use Topics  . Smoking status: Former Smoker -- 1.00 packs/day for 30 years    Types: Cigarettes    Quit date: 01/09/1958  . Smokeless tobacco: Not on file  . Alcohol  Use: No    Review of Systems  Constitutional: Positive for activity change and fatigue. Negative for fever, chills and appetite change.  HENT: Negative for congestion, postnasal drip and sore throat.   Eyes: Negative for visual disturbance.  Respiratory: Positive for cough, shortness of breath and wheezing. Negative for chest tightness.   Cardiovascular: Negative for chest pain.  Gastrointestinal: Negative for abdominal distention.  Genitourinary: Negative for dysuria, enuresis and difficulty urinating.  Musculoskeletal: Negative for arthralgias and neck pain.  Skin: Negative for rash.  Neurological: Positive for weakness. Negative for dizziness, light-headedness and headaches.  Psychiatric/Behavioral: Negative for confusion.  All other systems reviewed and are negative.    Allergies  Aricept; Codeine; Coreg; Penicillins; and Tramadol  Home Medications   Current Outpatient Rx  Name  Route  Sig  Dispense   Refill  . Acetaminophen (TYLENOL 8 HOUR PO)   Oral   Take 1,000 mg by mouth 2 (two) times daily.          . Calcium Carbonate-Vitamin D (CALCIUM + D PO)   Oral   Take 600 mg by mouth daily. 3 TABS DAILY          . furosemide (LASIX) 20 MG tablet   Oral   Take 20 mg by mouth daily. Only if gained 3 pounds in 24 hours         . magnesium oxide (MAG-OX) 400 MG tablet   Oral   Take 1 tablet (400 mg total) by mouth daily.   90 tablet   1   . omeprazole (PRILOSEC) 20 MG capsule   Oral   Take 1 capsule (20 mg total) by mouth daily.   30 capsule   5   . potassium chloride (MICRO-K) 10 MEQ CR capsule   Oral   Take 10 mEq by mouth daily. Take only on days that Lasix is taken.         . simvastatin (ZOCOR) 40 MG tablet      TAKE 1 TABLET ONCE DAILY FOR CHOLESTEROL.   90 tablet   0   . warfarin (COUMADIN) 1 MG tablet   Oral   Take 4 tablets (4 mg total) by mouth as directed. Take 4mg  each Friday and Monday. Take 3mg  on Tuesday, Wednesday, Thursday, Saturday, and Sunday.   135 tablet   5    BP 120/74  Pulse 80  Temp(Src) 98.6 F (37 C) (Oral)  Resp 18  SpO2 94% Physical Exam  Nursing note and vitals reviewed. Constitutional: He is oriented to person, place, and time. He appears well-developed.  HENT:  Head: Normocephalic and atraumatic.  Eyes: Conjunctivae and EOM are normal. Pupils are equal, round, and reactive to light.  Neck: Normal range of motion. Neck supple. No JVD present.  Cardiovascular: Normal rate and regular rhythm.   Pulmonary/Chest: Effort normal. He has wheezes. He has no rales.  Abdominal: Soft. Bowel sounds are normal. He exhibits no distension. There is no tenderness. There is no rebound and no guarding.  Musculoskeletal: He exhibits no edema.  Neurological: He is alert and oriented to person, place, and time.  Skin: Skin is warm.    ED Course  Procedures (including critical care time) Labs Review Labs Reviewed  CBC WITH DIFFERENTIAL -  Abnormal; Notable for the following:    RBC 4.07 (*)    Hemoglobin 12.9 (*)    HCT 37.0 (*)    Monocytes Relative 25 (*)    Monocytes Absolute 1.2 (*)  All other components within normal limits  BASIC METABOLIC PANEL - Abnormal; Notable for the following:    Sodium 131 (*)    Chloride 95 (*)    Glucose, Bld 100 (*)    GFR calc non Af Amer 74 (*)    GFR calc Af Amer 86 (*)    All other components within normal limits  PRO B NATRIURETIC PEPTIDE - Abnormal; Notable for the following:    Pro B Natriuretic peptide (BNP) 2988.0 (*)    All other components within normal limits  URINALYSIS, ROUTINE W REFLEX MICROSCOPIC - Abnormal; Notable for the following:    Color, Urine AMBER (*)    Protein, ur 30 (*)    All other components within normal limits  PROTIME-INR - Abnormal; Notable for the following:    Prothrombin Time 21.1 (*)    INR 1.89 (*)    All other components within normal limits  TROPONIN I  URINE MICROSCOPIC-ADD ON  INFLUENZA PANEL BY PCR (TYPE A & B, H1N1)  POCT I-STAT TROPONIN I   Imaging Review Dg Chest 2 View  02/08/2013   CLINICAL DATA:  Chest pain, weakness, coronary artery disease post MI and CABG, hypertension, atrial fibrillation, former smoker  EXAM: CHEST  2 VIEW  COMPARISON:  03/27/2011  FINDINGS: Enlargement of cardiac silhouette post CABG.  Tortuous aorta.  Pulmonary vascularity normal.  Bibasilar atelectasis and underlying COPD changes.  No acute infiltrate, pleural effusion or pneumothorax.  Bones appear diffusely demineralized.  IMPRESSION: Enlargement of cardiac silhouette post CABG.  COPD changes with bibasilar atelectasis.   Electronically Signed   By: Lavonia Dana M.D.   On: 02/08/2013 11:05    EKG Interpretation    Date/Time:  Saturday February 08 2013 15:25:37 EST Ventricular Rate:  68 PR Interval:    QRS Duration: 101 QT Interval:  474 QTC Calculation: 504 R Axis:   -18 Text Interpretation:  Atrial fibrillation Borderline left axis deviation  Anteroseptal infarct, age indeterminate Prolonged QT interval Confirmed by Kathrynn Humble, MD, Piercen Covino ([id number]) on 02/08/2013 3:37:59 PM          NO ACUTE CHANGES  MDM  No diagnosis found.  Pt comes in to the ED with cc of generalized weakness. Pt ambulated to the ambulance per EMS  -call out was for weakness.  Pt also was noted to have some dyspnea. He has advanced CHF and CAD that is being medically managed. Pt has wheezing on my exam - albuterol x 3 given  -and his lung exam has improved.   I had no rales on my exam - and CXR shows no pulm edema. BNP is 2900 - but this doesn't appear to be CHF exacerbation clinically - and so we ambulated patient in the ED, and he did well, no dyspnea and no hypoxia.  Influenza pcr sent, 1 dose of tamiflu given here (pt started feeling sick on Thursday), and i will f/u on the swab results to call in tamiflu if needed.  Pt will be treated as bronchitis - with prednisone and albuterol. Spoke with Dr. Mare Ferrari, and Cardiology team will call patient on Monday to set up an appt next week. He has been advised to take 20 mg oral lasix until then, in case this is slight fluid overload.  Strict return precautions have been discussed with the wife, who is very reliable, and also with the daughter over the phone, who is driving from [place].   Varney Biles, MD 02/08/13 [place], MD 02/08/13 1950  Varney Biles, MD 02/08/13 1538

## 2013-02-10 ENCOUNTER — Other Ambulatory Visit: Payer: Self-pay | Admitting: Internal Medicine

## 2013-02-11 ENCOUNTER — Encounter: Payer: Medicare Other | Admitting: Nurse Practitioner

## 2013-02-13 ENCOUNTER — Encounter: Payer: Self-pay | Admitting: Cardiology

## 2013-02-18 ENCOUNTER — Ambulatory Visit (INDEPENDENT_AMBULATORY_CARE_PROVIDER_SITE_OTHER): Payer: Medicare Other | Admitting: Nurse Practitioner

## 2013-02-18 ENCOUNTER — Encounter: Payer: Self-pay | Admitting: Nurse Practitioner

## 2013-02-18 VITALS — BP 140/80 | HR 70 | Ht 73.0 in | Wt 203.4 lb

## 2013-02-18 DIAGNOSIS — I2589 Other forms of chronic ischemic heart disease: Secondary | ICD-10-CM

## 2013-02-18 DIAGNOSIS — I4891 Unspecified atrial fibrillation: Secondary | ICD-10-CM

## 2013-02-18 DIAGNOSIS — I255 Ischemic cardiomyopathy: Secondary | ICD-10-CM

## 2013-02-18 LAB — BASIC METABOLIC PANEL
BUN: 15 mg/dL (ref 6–23)
CO2: 27 mEq/L (ref 19–32)
Calcium: 9.3 mg/dL (ref 8.4–10.5)
Chloride: 95 mEq/L — ABNORMAL LOW (ref 96–112)
Creatinine, Ser: 1 mg/dL (ref 0.4–1.5)
GFR: 74.46 mL/min (ref >60.00)
Glucose, Bld: 83 mg/dL (ref 70–99)
Potassium: 4.5 mEq/L (ref 3.5–5.1)
Sodium: 130 mEq/L — ABNORMAL LOW (ref 135–145)

## 2013-02-18 LAB — BRAIN NATRIURETIC PEPTIDE: Pro B Natriuretic peptide (BNP): 162 pg/mL — ABNORMAL HIGH (ref 0.0–100.0)

## 2013-02-18 NOTE — Patient Instructions (Addendum)
Stay on your current medicines but after we get your labs back we may need to change the fluid pill back to as needed  We need to recheck your labs for your fluid level and kidneys today  See Dr. Martinique in 6 months as planned  Call the [place] office at [phone] if you have any questions, problems or concerns.

## 2013-02-18 NOTE — Progress Notes (Signed)
Troy Freeman Date of Birth: 06/06/1920 Medical Record #[id number]  History of Present Illness: Troy Freeman is seen for a followup visit. He is 89. He has known CAD with LV dysfunction. He has been managed conservatively. He has a history of severe bradycardia on beta blockers and hypotension on ACEi/ARB therapy.His other problems includes chronic atrial fib, coumadin therapy and progressive dementia. His wife provides around the clock care for him. He is quite hard of hearing.   He was last seen here back in December and felt to be doing ok.   Has been to the ER on January 31st - turned out he had the flu (A) - but was told to come here for follow up last week - I made him reschedule his visit.   Comes back today. Here with his wife Nyoka Cowden - she remains his primary caregiver. Troy Freeman is doing ok. She does not really believe that he has had the flu - says he was sick before Christmas and did the same thing in January. She now tells me that she is quite upset that he was not seen last week (but last week told me she knew they should not be here). Regardless, he is doing better. Cough is improving. No chest pain. Weight is stable. Breathing is ok for the most part. She is still giving him the ProAir. Asking about PT - but would defer to his PCP.   Current Outpatient Prescriptions  Medication Sig Dispense Refill  . Acetaminophen (TYLENOL 8 HOUR PO) Take 1,000 mg by mouth 2 (two) times daily.       Marland Kitchen albuterol (PROVENTIL HFA;VENTOLIN HFA) 108 (90 BASE) MCG/ACT inhaler Inhale 1-2 puffs into the lungs every 6 (six) hours as needed for wheezing or shortness of breath.  1 Inhaler  0  . Calcium Carbonate-Vitamin D (CALCIUM + D PO) Take 600 mg by mouth daily. 3 TABS DAILY       . furosemide (LASIX) 20 MG tablet Take 20 mg by mouth daily.       . magnesium oxide (MAG-OX) 400 MG tablet Take 1 tablet (400 mg total) by mouth daily.  90 tablet  1  . omeprazole (PRILOSEC) 20 MG capsule Take 1 capsule (20 mg  total) by mouth daily.  30 capsule  5  . potassium chloride (K-DUR) 10 MEQ tablet TAKE 1 TABLET TWICE DAILY.  60 tablet  2  . simvastatin (ZOCOR) 40 MG tablet TAKE 1 TABLET ONCE DAILY FOR CHOLESTEROL.  90 tablet  0  . warfarin (COUMADIN) 1 MG tablet Take 4 tablets (4 mg total) by mouth as directed. Take 4mg  each Friday and Monday. Take 3mg  on Tuesday, Wednesday, Thursday, Saturday, and Sunday.  135 tablet  5   No current facility-administered medications for this visit.    Allergies  Allergen Reactions  . Aricept [Donepezil]     lethargy  . Codeine Nausea And Vomiting  . Coreg [Carvedilol] Other (See Comments)    Bradycardia   . Penicillins Rash  . Tramadol Nausea Only    Past Medical History  Diagnosis Date  . Bradycardia   . IHD (ischemic heart disease) July 2002    Large anterior MI with PCI to LAD with subsequent CABG x 6 in 2002  . LV dysfunction     EF is 30 to 35%; does not tolerate ACE due to cough and dizziness  . MI, old     OLD ANTERIOR MI  . Atrial fibrillation     managed on  coumadin and with rate control  . S/P CABG (coronary artery bypass graft) 2002  . Fall Aug 2012    with fractured right hip  . BPH (benign prostatic hyperplasia)   . GERD (gastroesophageal reflux disease)   . Dementia     mild  . Chronic anticoagulation   . Hyperlipidemia   . Hypertension   . Arthritis     Osteoarthritis  . Syncope 09/11/2008  . Osteoarthrosis, unspecified whether generalized or localized, unspecified site   . Long term (current) use of anticoagulants   . Congestive heart failure, unspecified   . Senile osteoporosis   . Other B-complex deficiencies   . Unspecified vitamin D deficiency   . Abnormality of gait   . Anemia, unspecified     Past Surgical History  Procedure Laterality Date  . Coronary angioplasty  07/2000    LAD  . Coronary artery bypass graft  08/2000    LIMA to LAD, SVG to RCA and PD, SVG to OM1 and OM2, and SVG to DX  . Right hip surgery Right  1996 & 2012    Replacement  . Left hip surgery Left 04/18/2005    Total replacement  . Knee arthroscopy Right     Arthroscopic surgery  . Eye surgery  2001    Cataract surgery  . Eye surgery  2006    Cataract surgery  . Esophagogastroduodenoscopy endoscopy    . Colon surgery  2004    Colonoscopy  . Colon surgery  10/29/2003    Colonoscopy    History  Smoking status  . Former Smoker -- 1.00 packs/day for 30 years  . Types: Cigarettes  . Quit date: 01/09/1958  Smokeless tobacco  . Not on file    History  Alcohol Use No    Family History  Problem Relation Age of Onset  . Cancer Mother   . Stroke Mother   . Heart disease Father   . Stroke Father   . Cancer Sister   . Cancer Brother   . Early death Brother     Review of Systems: The review of systems is per the HPI.  All other systems were reviewed and are negative.  Physical Exam: BP 140/80  Pulse 70  Ht 6\' 1"  (1.854 m)  Wt 203 lb 6.4 oz (92.262 kg)  BMI 26.84 kg/m2  SpO2 97% Patient is very pleasant and in no acute distress. Quite hard of hearing. Skin is warm and dry. Color is normal.  HEENT is unremarkable. Normocephalic/atraumatic. PERRL. Sclera are nonicteric. Neck is supple. No masses. No JVD. Lungs are clear. Cardiac exam shows an irregular rhythm. Rate is ok. Abdomen is soft. Extremities are without edema. Gait and ROM are intact. No gross neurologic deficits noted.  Wt Readings from Last 3 Encounters:  02/18/13 203 lb 6.4 oz (92.262 kg)  12/26/12 203 lb 12.8 oz (92.443 kg)  11/11/12 202 lb (91.627 kg)     LABORATORY DATA:  Lab Results  Component Value Date   WBC 4.7 02/08/2013   HGB 12.9* 02/08/2013   HCT 37.0* 02/08/2013   PLT 223 02/08/2013   GLUCOSE 100* 02/08/2013   CHOL 145 08/21/2010   TRIG 137 04/09/2012   HDL 47 04/09/2012   LDLCALC 71 04/09/2012   ALT 16 04/09/2012   AST 26 04/09/2012   NA 131* 02/08/2013   K 4.2 02/08/2013   CL 95* 02/08/2013   CREATININE 0.85 02/08/2013   BUN 14 02/08/2013    CO2 23 02/08/2013  TSH 3.488 08/21/2010   INR 1.89* 02/08/2013   Lab Results  Component Value Date   INR 1.89* 02/08/2013   INR 2.2* 01/27/2013   INR 2.2* 12/25/2012    Pro B Natriuretic peptide (BNP) 0 - 450 pg/mL  2988.0 (H    Assessment / Plan:  1. Recent influenza A   2. Ischemic CM - not able to tolerate ACE or beta blocker - managed conservatively - doing ok and seems to be holding his own for now. Wiil recheck his labs today - if improved would favor cutting the Lasix back to just prn as we used to do.   3. Advanced age  See him back in 6 months.  Patient is agreeable to this plan and will call if any problems develop in the interim.   Burtis Junes, RN, [facility] [address] [place], [place]  [phone]

## 2013-02-22 ENCOUNTER — Other Ambulatory Visit: Payer: Self-pay | Admitting: Internal Medicine

## 2013-02-24 ENCOUNTER — Other Ambulatory Visit: Payer: Medicare Other

## 2013-02-24 DIAGNOSIS — I4891 Unspecified atrial fibrillation: Secondary | ICD-10-CM

## 2013-02-24 DIAGNOSIS — Z7901 Long term (current) use of anticoagulants: Secondary | ICD-10-CM

## 2013-02-25 ENCOUNTER — Other Ambulatory Visit: Payer: Medicare Other

## 2013-02-25 LAB — PROTIME-INR
INR: 2.7 — ABNORMAL HIGH (ref 0.8–1.2)
Prothrombin Time: 27.6 s — ABNORMAL HIGH (ref 9.1–12.0)

## 2013-02-26 ENCOUNTER — Other Ambulatory Visit: Payer: Self-pay | Admitting: *Deleted

## 2013-02-26 DIAGNOSIS — I4891 Unspecified atrial fibrillation: Secondary | ICD-10-CM

## 2013-03-24 ENCOUNTER — Other Ambulatory Visit: Payer: Medicare Other

## 2013-03-24 DIAGNOSIS — I4891 Unspecified atrial fibrillation: Secondary | ICD-10-CM

## 2013-03-24 DIAGNOSIS — Z7901 Long term (current) use of anticoagulants: Secondary | ICD-10-CM

## 2013-03-25 LAB — PROTIME-INR
INR: 2.3 — ABNORMAL HIGH (ref 0.8–1.2)
Prothrombin Time: 23.7 s — ABNORMAL HIGH (ref 9.1–12.0)

## 2013-03-27 ENCOUNTER — Ambulatory Visit: Payer: Medicare Other

## 2013-04-23 ENCOUNTER — Other Ambulatory Visit: Payer: Self-pay | Admitting: Internal Medicine

## 2013-04-23 ENCOUNTER — Other Ambulatory Visit: Payer: Medicare Other

## 2013-04-23 DIAGNOSIS — I4891 Unspecified atrial fibrillation: Secondary | ICD-10-CM

## 2013-04-24 LAB — PROTIME-INR
INR: 2.1 — ABNORMAL HIGH (ref 0.8–1.2)
Prothrombin Time: 22.2 s — ABNORMAL HIGH (ref 9.1–12.0)

## 2013-05-08 ENCOUNTER — Other Ambulatory Visit: Payer: Self-pay | Admitting: Internal Medicine

## 2013-05-09 ENCOUNTER — Other Ambulatory Visit: Payer: Medicare Other

## 2013-05-09 DIAGNOSIS — I5022 Chronic systolic (congestive) heart failure: Secondary | ICD-10-CM

## 2013-05-09 DIAGNOSIS — E785 Hyperlipidemia, unspecified: Secondary | ICD-10-CM

## 2013-05-09 DIAGNOSIS — F0150 Vascular dementia without behavioral disturbance: Secondary | ICD-10-CM

## 2013-05-09 DIAGNOSIS — I480 Paroxysmal atrial fibrillation: Secondary | ICD-10-CM

## 2013-05-09 DIAGNOSIS — M170 Bilateral primary osteoarthritis of knee: Secondary | ICD-10-CM

## 2013-05-10 LAB — CBC WITH DIFFERENTIAL/PLATELET
Basophils Absolute: 0 10*3/uL (ref 0.0–0.2)
Basos: 1 %
Eos: 3 %
Eosinophils Absolute: 0.2 10*3/uL (ref 0.0–0.4)
HCT: 39.8 % (ref 37.5–51.0)
Hemoglobin: 14.5 g/dL (ref 12.6–17.7)
Immature Grans (Abs): 0 10*3/uL (ref 0.0–0.1)
Immature Granulocytes: 0 %
Lymphocytes Absolute: 1.3 10*3/uL (ref 0.7–3.1)
Lymphs: 24 %
MCH: 32.2 pg (ref 26.6–33.0)
MCHC: 36.4 g/dL — ABNORMAL HIGH (ref 31.5–35.7)
MCV: 88 fL (ref 79–97)
Monocytes Absolute: 0.6 10*3/uL (ref 0.1–0.9)
Monocytes: 11 %
Neutrophils Absolute: 3.3 10*3/uL (ref 1.4–7.0)
Neutrophils Relative %: 61 %
RBC: 4.51 x10E6/uL (ref 4.14–5.80)
RDW: 13.7 % (ref 12.3–15.4)
WBC: 5.4 10*3/uL (ref 3.4–10.8)

## 2013-05-10 LAB — LIPID PANEL
Chol/HDL Ratio: 3.3 ratio units (ref 0.0–5.0)
Cholesterol, Total: 147 mg/dL (ref 100–199)
HDL: 45 mg/dL (ref >39)
LDL Calculated: 77 mg/dL (ref 0–99)
Triglycerides: 127 mg/dL (ref 0–149)
VLDL Cholesterol Cal: 25 mg/dL (ref 5–40)

## 2013-05-10 LAB — COMPREHENSIVE METABOLIC PANEL
ALT: 18 IU/L (ref 0–44)
AST: 32 IU/L (ref 0–40)
Albumin/Globulin Ratio: 2 (ref 1.1–2.5)
Albumin: 4.7 g/dL — ABNORMAL HIGH (ref 3.2–4.6)
Alkaline Phosphatase: 51 IU/L (ref 39–117)
BUN/Creatinine Ratio: 15 (ref 10–22)
BUN: 13 mg/dL (ref 10–36)
CO2: 21 mmol/L (ref 18–29)
Calcium: 9.2 mg/dL (ref 8.6–10.2)
Chloride: 93 mmol/L — ABNORMAL LOW (ref 97–108)
Creatinine, Ser: 0.86 mg/dL (ref 0.76–1.27)
GFR calc Af Amer: 88 mL/min/{1.73_m2} (ref >59)
GFR calc non Af Amer: 76 mL/min/{1.73_m2} (ref >59)
Globulin, Total: 2.3 g/dL (ref 1.5–4.5)
Glucose: 92 mg/dL (ref 65–99)
Potassium: 4.2 mmol/L (ref 3.5–5.2)
Sodium: 134 mmol/L (ref 134–144)
Total Bilirubin: 0.7 mg/dL (ref 0.0–1.2)
Total Protein: 7 g/dL (ref 6.0–8.5)

## 2013-05-12 ENCOUNTER — Ambulatory Visit (INDEPENDENT_AMBULATORY_CARE_PROVIDER_SITE_OTHER): Payer: Medicare Other | Admitting: Internal Medicine

## 2013-05-12 ENCOUNTER — Encounter: Payer: Self-pay | Admitting: Internal Medicine

## 2013-05-12 VITALS — BP 122/76 | HR 74 | Temp 97.7°F | Wt 205.0 lb

## 2013-05-12 DIAGNOSIS — I509 Heart failure, unspecified: Secondary | ICD-10-CM

## 2013-05-12 DIAGNOSIS — IMO0002 Reserved for concepts with insufficient information to code with codable children: Secondary | ICD-10-CM

## 2013-05-12 DIAGNOSIS — I10 Essential (primary) hypertension: Secondary | ICD-10-CM

## 2013-05-12 DIAGNOSIS — E785 Hyperlipidemia, unspecified: Secondary | ICD-10-CM

## 2013-05-12 DIAGNOSIS — I5022 Chronic systolic (congestive) heart failure: Secondary | ICD-10-CM

## 2013-05-12 DIAGNOSIS — I4891 Unspecified atrial fibrillation: Secondary | ICD-10-CM

## 2013-05-12 DIAGNOSIS — M170 Bilateral primary osteoarthritis of knee: Secondary | ICD-10-CM | POA: Insufficient documentation

## 2013-05-12 DIAGNOSIS — M1710 Unilateral primary osteoarthritis, unspecified knee: Secondary | ICD-10-CM

## 2013-05-12 DIAGNOSIS — R269 Unspecified abnormalities of gait and mobility: Secondary | ICD-10-CM

## 2013-05-12 MED ORDER — TETANUS-DIPHTH-ACELL PERTUSSIS 5-2.5-18.5 LF-MCG/0.5 IM SUSP: 0.5000 mL | Freq: Once | INTRAMUSCULAR | Status: DC

## 2013-05-12 NOTE — Progress Notes (Signed)
Patient ID: Troy Freeman, male   DOB: 1920-12-11, 80 y.o.   MRN: [id number]   Location:  [facility]  Code Status: DNR  Allergies  Allergen Reactions  . Aricept [Donepezil]     lethargy  . Codeine Nausea And Vomiting  . Coreg [Carvedilol] Other (See Comments)    Bradycardia   . Penicillins Rash  . Tramadol Nausea Only    Chief Complaint  Patient presents with  . Medical Management of Chronic Issues    6 month follow-up, discuss labs (copy printed)     HPI: Patient is a 80 y.o. white male seen in the office today for regular f/u visit.    No concerns now.  Doing well.  Still playing bridge.  Doesn't remember what day it is.  Doing ok playing the game.  Others have some dementia as well.  Still very HOH even with hearing aides.  Has some bojangles here and there and some ice cream.  Did have to get his lasix and kcl today b/c he gained 3# overnight when he ate soup for dinner last night.  Does not get exercise due to knee pain.  Hurt when he walks and more at night--gets sharp pain down his left leg at that time.  Voltaren gel helps knees.    Bowels are moving.  Urinating ok--is up 2-3 times per night.   Sleeps well in between.     A little dyspnea on exertion.    Does use his exercise bike to strengthen his legs, but still with difficulty getting out of chair.    Review of Systems:  Review of Systems  Constitutional: Negative for malaise/fatigue.  HENT: Positive for hearing loss.   Eyes: Negative for blurred vision.       Can see tv better w/o glasses, but needs them to read, can even read some w/o, too--didn't need prescription changed last time  Respiratory: Negative for cough and shortness of breath.        Some dyspnea on exertion  Cardiovascular: Negative for chest pain, palpitations and leg swelling.  Gastrointestinal: Negative for abdominal pain, constipation, blood in stool and melena.  Genitourinary:   Nocturia  Musculoskeletal: Positive for falls.       Had minor fall per his wife, Dixie.  New stool to sit on was narrower and he missed it and fell;  Hard to get up and down due to proximal muscle weakness  Skin: Negative for rash.  Neurological: Positive for dizziness. Negative for loss of consciousness, weakness and headaches.       Neuropathic pain down left leg;  Occasionally dizzy first thing in am for a second or two  Psychiatric/Behavioral: Positive for memory loss. Negative for depression. The patient does not have insomnia.      Past Medical History  Diagnosis Date  . Bradycardia   . IHD (ischemic heart disease) July 2002    Large anterior MI with PCI to LAD with subsequent CABG x 6 in 2002  . LV dysfunction     EF is 30 to 35%; does not tolerate ACE due to cough and dizziness  . MI, old     OLD ANTERIOR MI  . Atrial fibrillation     managed on coumadin and with rate control  . S/P CABG (coronary artery bypass graft) 2002  . Fall Aug 2012    with fractured right hip  . BPH (benign prostatic hyperplasia)   . GERD (gastroesophageal reflux  disease)   . Dementia     mild  . Chronic anticoagulation   . Hyperlipidemia   . Hypertension   . Arthritis     Osteoarthritis  . Syncope 09/11/2008  . Osteoarthrosis, unspecified whether generalized or localized, unspecified site   . Long term (current) use of anticoagulants   . Congestive heart failure, unspecified   . Senile osteoporosis   . Other B-complex deficiencies   . Unspecified vitamin D deficiency   . Abnormality of gait   . Anemia, unspecified     Past Surgical History  Procedure Laterality Date  . Coronary angioplasty  07/2000    LAD  . Coronary artery bypass graft  08/2000    LIMA to LAD, SVG to RCA and PD, SVG to OM1 and OM2, and SVG to DX  . Right hip surgery Right 1996 & 2012    Replacement  . Left hip surgery Left 04/18/2005    Total replacement  . Knee arthroscopy Right     Arthroscopic surgery  . Eye  surgery  2001    Cataract surgery  . Eye surgery  2006    Cataract surgery  . Esophagogastroduodenoscopy endoscopy    . Colon surgery  2004    Colonoscopy  . Colon surgery  10/29/2003    Colonoscopy    Social History:   reports that he quit smoking about 55 years ago. His smoking use included Cigarettes. He has a 30 pack-year smoking history. He does not have any smokeless tobacco history on file. He reports that he does not drink alcohol or use illicit drugs.  Family History  Problem Relation Age of Onset  . Cancer Mother   . Stroke Mother   . Heart disease Father   . Stroke Father   . Cancer Sister   . Cancer Brother   . Early death Brother     Medications: Patient's Medications  New Prescriptions   No medications on file  Previous Medications   ACETAMINOPHEN (TYLENOL 8 HOUR PO)    Take 1,000 mg by mouth 2 (two) times daily.    CALCIUM CARBONATE-VITAMIN D (CALCIUM + D PO)    Take 600 mg by mouth daily. 3 TABS DAILY    FUROSEMIDE (LASIX) 20 MG TABLET    Take 20 mg by mouth daily. Only take if weight gain of 3 pounds in 24 hours   MAGNESIUM OXIDE 400 (240 MG) MG TABS    TAKE 1 TABLET DAILY.   OMEPRAZOLE (PRILOSEC) 20 MG CAPSULE    TAKE 1 CAPSULE DAILY FOR STOMACH ACID   POTASSIUM CHLORIDE (K-DUR) 10 MEQ TABLET    TAKE 1 TABLET TWICE DAILY.   WARFARIN (COUMADIN) 1 MG TABLET    Take 4 tablets (4 mg total) by mouth as directed. Take 4mg  each Friday and Monday. Take 3mg  on Tuesday, Wednesday, Thursday, Saturday, and Sunday.  Modified Medications   Modified Medication Previous Medication   SIMVASTATIN (ZOCOR) 40 MG TABLET simvastatin (ZOCOR) 40 MG tablet      TAKE 1 TABLET ONCE DAILY FOR CHOLESTEROL. Only take if weight gain of 3 pounds in 24 hours    TAKE 1 TABLET ONCE DAILY FOR CHOLESTEROL.   TDAP (BOOSTRIX) 5-2.5-18.5 LF-MCG/0.5 INJECTION Tdap (BOOSTRIX) 5-2.5-18.5 LF-MCG/0.5 injection      Inject 0.5 mLs into the muscle once.    Inject 0.5 mLs into the muscle once.    Discontinued Medications   ALBUTEROL (PROVENTIL HFA;VENTOLIN HFA) 108 (90 BASE) MCG/ACT INHALER    Inhale 1-2  puffs into the lungs every 6 (six) hours as needed for wheezing or shortness of breath.     Physical Exam: Filed Vitals:   05/12/13 1003  BP: 122/76  Pulse: 74  Temp: 97.7 F (36.5 C)  TempSrc: Oral  Weight: 205 lb (92.987 kg)  SpO2: 97%  Physical Exam  Constitutional: He appears well-developed and well-nourished. No distress.  Tall white male, stooped posture, walks slowly with rollator walker  HENT:  Wears hearing aides, but still very HOH  Eyes:  Wears glasses  Cardiovascular:  Murmur heard. irreg irreg  Pulmonary/Chest: Effort normal and breath sounds normal. He has no wheezes.  Abdominal: Soft. Bowel sounds are normal. He exhibits no distension and no mass. There is no tenderness.  Musculoskeletal: Normal range of motion. He exhibits tenderness.  Tender over left knee with ROM, crepitus present  Neurological: He is alert.  Oriented to persona and place  Skin: Skin is warm and dry. There is pallor.     Labs reviewed: Basic Metabolic Panel:  Recent Labs  02/08/13 1036 02/18/13 1159 05/09/13 0959  NA 131* 130* 134  K 4.2 4.5 4.2  CL 95* 95* 93*  CO2 23 27 21   GLUCOSE 100* 83 92  BUN 14 15 13   CREATININE 0.85 1.0 0.86  CALCIUM 9.0 9.3 9.2   Liver Function Tests:  Recent Labs  05/09/13 0959  AST 32  ALT 18  ALKPHOS 51  BILITOT 0.7  PROT 7.0  CBC:  Recent Labs  08/12/12 1133 02/08/13 1036 05/09/13 0959  WBC 5.9 4.7 5.4  NEUTROABS 3.8 2.7 3.3  HGB 14.0 12.9* 14.5  HCT 41.1 37.0* 39.8  MCV 92 90.9 88  PLT  --  223  --    Lipid Panel:  Recent Labs  05/09/13 0959  HDL 45  LDLCALC 77  TRIG 127  CHOLHDL 3.3   Assessment/Plan 1. Osteoarthritis of both knees -left greater than right -will continue voltaren gel as needed and tylenol -cont exercises with exercise bike and walking when he can with his rollator walker  2.  Systolic CHF, chronic -well controlled when he is adherent with low sodium diet -had to get his lasix and kcl this am due to his weight gain attributed to salty soup intake last night  3. Atrial fibrillation -rate is controlled, remains on coumadin--last several INRs have been therapeutic, due again midmay for recheck--order placed today  4. Hyperlipidemia LDL goal < 100 -at goal with statin therapy at current dose and w/o myopathy  5. Essential hypertension, benign -at goal -notes orthostasis only first thing in the morning, but handles well with a minute of rest before standing up from bed  6. Abnormality of gait -has proximal muscle weakness of thighs, knee OA and some sciatica on the left that contribute -continues with exercise bike use to maintain the muscle strength he does have -dementia is fairly stable over several years--still plays bridge thought he doesn't remember grandkids names (sees only 1x per year) or what day it is most of the time--recheck mmse next time -PT felt they could not really help him per his wife  Labs/tests ordered:  Will check before visit in 6 mos for MMSE  Next appt:  6 mos, labs before (cbc, bmp)

## 2013-05-23 ENCOUNTER — Other Ambulatory Visit: Payer: Medicare Other

## 2013-05-23 DIAGNOSIS — I4891 Unspecified atrial fibrillation: Secondary | ICD-10-CM

## 2013-05-24 LAB — PROTIME-INR
INR: 1.9 — ABNORMAL HIGH (ref 0.8–1.2)
Prothrombin Time: 19.6 s — ABNORMAL HIGH (ref 9.1–12.0)

## 2013-06-10 ENCOUNTER — Other Ambulatory Visit: Payer: Self-pay | Admitting: Internal Medicine

## 2013-06-23 ENCOUNTER — Encounter: Payer: Self-pay | Admitting: Cardiology

## 2013-06-23 ENCOUNTER — Ambulatory Visit (INDEPENDENT_AMBULATORY_CARE_PROVIDER_SITE_OTHER): Payer: Medicare Other | Admitting: Cardiology

## 2013-06-23 VITALS — BP 140/80 | HR 76 | Ht 73.0 in | Wt 204.0 lb

## 2013-06-23 DIAGNOSIS — R609 Edema, unspecified: Secondary | ICD-10-CM

## 2013-06-23 DIAGNOSIS — I509 Heart failure, unspecified: Secondary | ICD-10-CM

## 2013-06-23 DIAGNOSIS — I519 Heart disease, unspecified: Secondary | ICD-10-CM

## 2013-06-23 DIAGNOSIS — I5022 Chronic systolic (congestive) heart failure: Secondary | ICD-10-CM

## 2013-06-23 DIAGNOSIS — I259 Chronic ischemic heart disease, unspecified: Secondary | ICD-10-CM

## 2013-06-23 NOTE — Patient Instructions (Signed)
Continue your current therapy  I will see you in 6 months.   

## 2013-06-24 ENCOUNTER — Other Ambulatory Visit: Payer: Medicare Other

## 2013-06-24 DIAGNOSIS — I4891 Unspecified atrial fibrillation: Secondary | ICD-10-CM

## 2013-06-24 DIAGNOSIS — Z7901 Long term (current) use of anticoagulants: Secondary | ICD-10-CM

## 2013-06-25 ENCOUNTER — Other Ambulatory Visit: Payer: Self-pay

## 2013-06-25 DIAGNOSIS — I4891 Unspecified atrial fibrillation: Secondary | ICD-10-CM

## 2013-06-25 DIAGNOSIS — Z7901 Long term (current) use of anticoagulants: Secondary | ICD-10-CM

## 2013-06-25 LAB — PROTIME-INR
INR: 2.4 — ABNORMAL HIGH (ref 0.8–1.2)
Prothrombin Time: 24.4 s — ABNORMAL HIGH (ref 9.1–12.0)

## 2013-06-25 NOTE — Progress Notes (Signed)
Troy Freeman Date of Birth: Sep 18, 1920 Medical Record #[id number]  History of Present Illness: [name] is seen for a followup visit. He is 89. He has known CAD with LV dysfunction. He has been managed conservatively. His other problems includes chronic atrial fib, coumadin therapy and progressive dementia. His wife provides around the clock care for him. He is quite hard of hearing.  He has a history of severe bradycardia on beta blockers and hypotension on ACEi/ARB therapy. No increased SOB or edema. He does note some dyspnea and decreased energy when he walks a lot. He has chronic edema. He is limited by bad knees.  No dizzyness or syncope. Denies chest pain.  Current Outpatient Prescriptions on File Prior to Visit  Medication Sig Dispense Refill  . Acetaminophen (TYLENOL 8 HOUR PO) Take 1,000 mg by mouth 2 (two) times daily.       . Calcium Carbonate-Vitamin D (CALCIUM + D PO) Take 600 mg by mouth daily. 3 TABS DAILY       . COUMADIN 1 MG tablet TAKE 4 TABLETS ON FRIDAY AND MONDAY, AND 3 TABLETS ON TUES, WED, THURS, SATURDAY, AND SUNDAY.  105 tablet  2  . furosemide (LASIX) 20 MG tablet Take 20 mg by mouth daily. Only take if weight gain of 3 pounds in 24 hours      . Magnesium Oxide 400 (240 MG) MG TABS TAKE 1 TABLET DAILY.  90 tablet  0  . omeprazole (PRILOSEC) 20 MG capsule TAKE 1 CAPSULE DAILY FOR STOMACH ACID  30 capsule  5  . potassium chloride (K-DUR) 10 MEQ tablet TAKE 1 TABLET TWICE DAILY.  60 tablet  2  . simvastatin (ZOCOR) 40 MG tablet TAKE 1 TABLET ONCE DAILY FOR CHOLESTEROL. Only take if weight gain of 3 pounds in 24 hours      . [DISCONTINUED] magnesium oxide (MAG-OX) 400 MG tablet Take 1 tablet (400 mg total) by mouth daily.  90 tablet  1   No current facility-administered medications on file prior to visit.    Allergies  Allergen Reactions  . Aricept [Donepezil]     lethargy  . Codeine Nausea And Vomiting  . Coreg [Carvedilol] Other (See Comments)   Bradycardia   . Penicillins Rash  . Tramadol Nausea Only    Past Medical History  Diagnosis Date  . Bradycardia   . IHD (ischemic heart disease) July 2002    Large anterior MI with PCI to LAD with subsequent CABG x 6 in 2002  . LV dysfunction     EF is 30 to 35%; does not tolerate ACE due to cough and dizziness  . MI, old     OLD ANTERIOR MI  . Atrial fibrillation     managed on coumadin and with rate control  . S/P CABG (coronary artery bypass graft) 2002  . Fall Aug 2012    with fractured right hip  . BPH (benign prostatic hyperplasia)   . GERD (gastroesophageal reflux disease)   . Dementia     mild  . Chronic anticoagulation   . Hyperlipidemia   . Hypertension   . Arthritis     Osteoarthritis  . Syncope 09/11/2008  . Osteoarthrosis, unspecified whether generalized or localized, unspecified site   . Long term (current) use of anticoagulants   . Congestive heart failure, unspecified   . Senile osteoporosis   . Other B-complex deficiencies   . Unspecified vitamin D deficiency   . Abnormality of gait   . Anemia,  unspecified     Past Surgical History  Procedure Laterality Date  . Coronary angioplasty  07/2000    LAD  . Coronary artery bypass graft  08/2000    LIMA to LAD, SVG to RCA and PD, SVG to OM1 and OM2, and SVG to DX  . Right hip surgery Right 1996 & 2012    Replacement  . Left hip surgery Left 04/18/2005    Total replacement  . Knee arthroscopy Right     Arthroscopic surgery  . Eye surgery  2001    Cataract surgery  . Eye surgery  2006    Cataract surgery  . Esophagogastroduodenoscopy endoscopy    . Colon surgery  2004    Colonoscopy  . Colon surgery  10/29/2003    Colonoscopy    History  Smoking status  . Former Smoker -- 1.00 packs/day for 30 years  . Types: Cigarettes  . Quit date: 01/09/1958  Smokeless tobacco  . Not on file    History  Alcohol Use No    Family History  Problem Relation Age of Onset  . Cancer Mother   . Stroke  Mother   . Heart disease Father   . Stroke Father   . Cancer Sister   . Cancer Brother   . Early death Brother     Review of Systems: The review of systems is per the HPI.  All other systems were reviewed and are negative.  Physical Exam: BP 140/80  Pulse 76  Ht 6\' 1"  (1.854 m)  Wt 204 lb (92.534 kg)  BMI 26.92 kg/m2 Patient is very pleasant and in no acute distress. He is quite hard of hearing. Skin is warm and dry. Color is normal.  HEENT is unremarkable. Normocephalic/atraumatic. PERRL. Sclera are nonicteric. Neck is supple. No masses. No JVD. Lungs are clear. Cardiac exam shows an irregular rhythm. His rate is controlled. Old sternotomy scar. Normal S1-2 without murmur. Abdomen is soft. Extremities reveal 2+ left ankle edema and trace on the right. Gait and ROM are intact. He is using a walker. No gross neurologic deficits noted.  LABORATORY DATA:    Lab Results  Component Value Date   WBC 5.4 05/09/2013   HGB 14.5 05/09/2013   HCT 39.8 05/09/2013   PLT 223 02/08/2013   GLUCOSE 92 05/09/2013   CHOL 145 08/21/2010   TRIG 127 05/09/2013   HDL 45 05/09/2013   LDLCALC 77 05/09/2013   ALT 18 05/09/2013   AST 32 05/09/2013   NA 134 05/09/2013   K 4.2 05/09/2013   CL 93* 05/09/2013   CREATININE 0.86 05/09/2013   BUN 13 05/09/2013   CO2 21 05/09/2013   TSH 3.488 08/21/2010   INR 2.4* 06/24/2013     Assessment / Plan: 1. Bradycardia -resolved since beta blocker discontinued.   2. CAD -s/p CABG- no chest pain.  3. Atrial fib - rate is controlled. He is on chronic anticoagulation with coumadin. INR therapeutic at 2.4.   4. Ischemic cardiomyopathy EF 35%. Asymptomatic. Intolerant of meds as noted above. Continue to monitor. Using lasix prn only.

## 2013-07-22 ENCOUNTER — Other Ambulatory Visit: Payer: Self-pay | Admitting: Internal Medicine

## 2013-07-24 ENCOUNTER — Other Ambulatory Visit: Payer: Medicare Other

## 2013-07-24 DIAGNOSIS — I4891 Unspecified atrial fibrillation: Secondary | ICD-10-CM

## 2013-07-24 DIAGNOSIS — Z7901 Long term (current) use of anticoagulants: Secondary | ICD-10-CM

## 2013-07-25 ENCOUNTER — Telehealth: Payer: Self-pay

## 2013-07-25 LAB — PROTIME-INR
INR: 2 — ABNORMAL HIGH (ref 0.8–1.2)
Prothrombin Time: 20.3 s — ABNORMAL HIGH (ref 9.1–12.0)

## 2013-07-25 NOTE — Telephone Encounter (Signed)
Discussed results with patient's wife, Dixie verbalized understanding of results

## 2013-07-25 NOTE — Telephone Encounter (Signed)
Message copied by Logan Bores on Fri Jul 25, 2013  2:05 PM ------      Message from: [place], [place] L      Created: Fri Jul 25, 2013  1:10 PM       INR at goal, continue same and recheck in 1 month ------

## 2013-08-07 ENCOUNTER — Other Ambulatory Visit: Payer: Self-pay | Admitting: Internal Medicine

## 2013-08-22 ENCOUNTER — Other Ambulatory Visit: Payer: Self-pay | Admitting: Internal Medicine

## 2013-08-25 ENCOUNTER — Other Ambulatory Visit: Payer: Medicare Other

## 2013-08-25 DIAGNOSIS — I4891 Unspecified atrial fibrillation: Secondary | ICD-10-CM

## 2013-08-25 DIAGNOSIS — Z7901 Long term (current) use of anticoagulants: Secondary | ICD-10-CM

## 2013-08-26 LAB — PROTIME-INR
INR: 2.2 — ABNORMAL HIGH (ref 0.8–1.2)
Prothrombin Time: 22.6 s — ABNORMAL HIGH (ref 9.1–12.0)

## 2013-09-13 ENCOUNTER — Other Ambulatory Visit: Payer: Self-pay | Admitting: Internal Medicine

## 2013-09-16 ENCOUNTER — Telehealth: Payer: Self-pay | Admitting: Cardiology

## 2013-09-16 NOTE — Telephone Encounter (Signed)
New Message  Pt daughter called in reports the pt has been complaining that his heart aches. Request a call back to discuss

## 2013-09-16 NOTE — Telephone Encounter (Signed)
Returned call to patient's wife she stated husband is having chest pain.Stated pain started after lunch today.Patient rates pain # 6.Also complains of dizziness.Advised to go to [facility] ER.Trish called.

## 2013-09-24 ENCOUNTER — Other Ambulatory Visit: Payer: Medicare Other

## 2013-09-24 DIAGNOSIS — I4891 Unspecified atrial fibrillation: Secondary | ICD-10-CM

## 2013-09-24 DIAGNOSIS — Z7901 Long term (current) use of anticoagulants: Secondary | ICD-10-CM

## 2013-09-25 LAB — PROTIME-INR
INR: 2.2 — ABNORMAL HIGH (ref 0.8–1.2)
Prothrombin Time: 23.1 s — ABNORMAL HIGH (ref 9.1–12.0)

## 2013-09-26 ENCOUNTER — Ambulatory Visit (INDEPENDENT_AMBULATORY_CARE_PROVIDER_SITE_OTHER): Payer: Medicare Other | Admitting: Nurse Practitioner

## 2013-09-26 ENCOUNTER — Encounter: Payer: Self-pay | Admitting: Nurse Practitioner

## 2013-09-26 VITALS — BP 140/80 | HR 67 | Ht 73.0 in | Wt 197.4 lb

## 2013-09-26 DIAGNOSIS — I509 Heart failure, unspecified: Secondary | ICD-10-CM

## 2013-09-26 DIAGNOSIS — I5022 Chronic systolic (congestive) heart failure: Secondary | ICD-10-CM

## 2013-09-26 DIAGNOSIS — I10 Essential (primary) hypertension: Secondary | ICD-10-CM

## 2013-09-26 DIAGNOSIS — I259 Chronic ischemic heart disease, unspecified: Secondary | ICD-10-CM

## 2013-09-26 MED ORDER — NITROGLYCERIN 0.4 MG SL SUBL: 0.4000 mg | SUBLINGUAL_TABLET | SUBLINGUAL | Status: AC | PRN

## 2013-09-26 NOTE — Patient Instructions (Addendum)
Stay on your current medicines.  If he has more chest discomfort - give him the Pepcid, try an extra Tylenol and then use NTG -  This was sent to the drug store.  I have sent in a RX for NTG - Use your NTG under your tongue for recurrent chest pain. May take one tablet every 5 minutes. If you are still having discomfort after 3 tablets in 15 minutes, call 911.  Call the [place] office at [phone] if you have any questions, problems or concerns.

## 2013-09-26 NOTE — Progress Notes (Addendum)
Pieter Partridge Date of Birth: 01-31-1920 Medical Record #[id number]  History of Present Illness: [name] is seen for a work in visit. He is seen for Dr. Martinique.  He is now 10.  He has known CAD with LV dysfunction. He has been managed conservatively. His other problems includes chronic atrial fib, coumadin therapy and progressive dementia. His wife provides around the clock care for him. He is quite hard of hearing. He has a history of severe bradycardia on beta blockers and hypotension on ACEi/ARB therapy.   He was as last seen in June - felt to be doing ok but slowly declining. Limited by his knees.  She called up here earlier this month - was having chest pain - advised to to go the ER but they refused. Thus made this appointment with me today.   Comes in today. Here with his wife - she gives most of the history.  He is even more inactive. Hardly walking. Spends most of his days in his recliner. Earlier this month he had some chest pain - happened after eating lunch - he was rubbing his chest. She gave him Pepcid but did not seem to help. Did not want to take him to the ER. She and her kids want just a conservative approach. On Tuesday of this week, he had too spells of vomiting - no other symptoms but did have poor appetite - now better and back to his baseline. She does not have NTG at home. He continues to ride his stationary bike and has no problems/symptoms with that.  Current Outpatient Prescriptions  Medication Sig Dispense Refill  . Acetaminophen (TYLENOL 8 HOUR PO) Take 1,000 mg by mouth 2 (two) times daily.       . Calcium Carbonate-Vitamin D (CALCIUM + D PO) Take 600 mg by mouth daily. 3 TABS DAILY       . COUMADIN 1 MG tablet TAKE 4 TABLETS ON FRIDAY AND MONDAY, AND 3 TABLETS ON TUES, WED, THURS, SATURDAY, AND SUNDAY.  105 tablet  0  . furosemide (LASIX) 20 MG tablet Take 20 mg by mouth daily. Only take if weight gain of 3 pounds in 24 hours      . Magnesium Oxide 400  (240 MG) MG TABS TAKE 1 TABLET DAILY.  90 tablet  1  . omeprazole (PRILOSEC) 20 MG capsule TAKE 1 CAPSULE DAILY FOR STOMACH ACID  30 capsule  3  . potassium chloride (K-DUR) 10 MEQ tablet TAKE 1 TABLET TWICE DAILY.  60 tablet  2  . simvastatin (ZOCOR) 40 MG tablet TAKE 1 TABLET ONCE DAILY FOR CHOLESTEROL.  90 tablet  0  . nitroGLYCERIN (NITROSTAT) 0.4 MG SL tablet Place 1 tablet (0.4 mg total) under the tongue every 5 (five) minutes as needed for chest pain.  25 tablet  3  . [DISCONTINUED] magnesium oxide (MAG-OX) 400 MG tablet Take 1 tablet (400 mg total) by mouth daily.  90 tablet  1   No current facility-administered medications for this visit.    Allergies  Allergen Reactions  . Aricept [Donepezil]     lethargy  . Codeine Nausea And Vomiting  . Coreg [Carvedilol] Other (See Comments)    Bradycardia   . Penicillins Rash  . Tramadol Nausea Only    Past Medical History  Diagnosis Date  . Bradycardia   . IHD (ischemic heart disease) July 2002    Large anterior MI with PCI to LAD with subsequent CABG x 6 in 2002  .  LV dysfunction     EF is 30 to 35%; does not tolerate ACE due to cough and dizziness  . MI, old     OLD ANTERIOR MI  . Atrial fibrillation     managed on coumadin and with rate control  . S/P CABG (coronary artery bypass graft) 2002  . Fall Aug 2012    with fractured right hip  . BPH (benign prostatic hyperplasia)   . GERD (gastroesophageal reflux disease)   . Dementia     mild  . Chronic anticoagulation   . Hyperlipidemia   . Hypertension   . Arthritis     Osteoarthritis  . Syncope 09/11/2008  . Osteoarthrosis, unspecified whether generalized or localized, unspecified site   . Long term (current) use of anticoagulants   . Congestive heart failure, unspecified   . Senile osteoporosis   . Other B-complex deficiencies   . Unspecified vitamin D deficiency   . Abnormality of gait   . Anemia, unspecified     Past Surgical History  Procedure Laterality  Date  . Coronary angioplasty  07/2000    LAD  . Coronary artery bypass graft  08/2000    LIMA to LAD, SVG to RCA and PD, SVG to OM1 and OM2, and SVG to DX  . Right hip surgery Right 1996 & 2012    Replacement  . Left hip surgery Left 04/18/2005    Total replacement  . Knee arthroscopy Right     Arthroscopic surgery  . Eye surgery  2001    Cataract surgery  . Eye surgery  2006    Cataract surgery  . Esophagogastroduodenoscopy endoscopy    . Colon surgery  2004    Colonoscopy  . Colon surgery  10/29/2003    Colonoscopy    History  Smoking status  . Former Smoker -- 1.00 packs/day for 30 years  . Types: Cigarettes  . Quit date: 01/09/1958  Smokeless tobacco  . Not on file    History  Alcohol Use No    Family History  Problem Relation Age of Onset  . Cancer Mother   . Stroke Mother   . Heart disease Father   . Stroke Father   . Cancer Sister   . Cancer Brother   . Early death Brother     Review of Systems: The review of systems is per the HPI.  All other systems were reviewed and are negative.  Physical Exam: BP 140/80  Pulse 67  Ht 6\' 1"  (1.854 m)  Wt 197 lb 6.4 oz (89.54 kg)  BMI 26.05 kg/m2  SpO2 97% Patient is very pleasant and in no acute distress. Skin is warm and dry. Color is normal.  HEENT is unremarkable. Normocephalic/atraumatic. PERRL. Sclera are nonicteric. Neck is supple. No masses. No JVD. Lungs are clear. Cardiac exam shows an irregular rhythm. Abdomen is soft. Extremities are without edema. Gait and ROM are intact. No gross neurologic deficits noted.  Wt Readings from Last 3 Encounters:  09/26/13 197 lb 6.4 oz (89.54 kg)  06/23/13 204 lb (92.534 kg)  05/12/13 205 lb (92.987 kg)    LABORATORY DATA/PROCEDURES:  Lab Results  Component Value Date   WBC 5.4 05/09/2013   HGB 14.5 05/09/2013   HCT 39.8 05/09/2013   PLT 223 02/08/2013   GLUCOSE 92 05/09/2013   CHOL 145 08/21/2010   TRIG 127 05/09/2013   HDL 45 05/09/2013   LDLCALC 77 05/09/2013   ALT 18  05/09/2013   AST 32 05/09/2013  NA 134 05/09/2013   K 4.2 05/09/2013   CL 93* 05/09/2013   CREATININE 0.86 05/09/2013   BUN 13 05/09/2013   CO2 21 05/09/2013   TSH 3.488 08/21/2010   INR 2.2* 09/24/2013    BNP (last 3 results)  Recent Labs  02/08/13 1036 02/18/13 1159  PROBNP 2988.0* 162.0*     Assessment / Plan: 1. Bradycardia -resolved since beta blocker discontinued.   2. CAD -s/p CABG- managed medically. Wants a conservative approach. Have asked Dixie if this happens again, to use Pepcid, some Tylenol and then NTG but to have him lie down. EKG today with RBBB, septal infarct and lateral ST/T wave changes.  3. Atrial fib - rate is controlled. He is on chronic anticoagulation with coumadin.  4. Ischemic cardiomyopathy EF 35%. Asymptomatic. Intolerant of meds as noted above. Continue to monitor. Using lasix prn only.  5. Advanced age - he does seem to be slowing down in a generalized fashion.   See back in December as planned.   Patient is agreeable to this plan and will call if any problems develop in the interim.   Burtis Junes, RN, [facility] [address] [place], [place]  [address] [phone]

## 2013-09-30 ENCOUNTER — Telehealth: Payer: Self-pay | Admitting: *Deleted

## 2013-09-30 NOTE — Telephone Encounter (Signed)
Patient wife, Troy Freeman, called and stated that she has not received results from patient's [facility] and it has been since the 16th. Chrae called Dr. Mariea Clonts and she apologized and stated for patient to continue current dose of Coumadin and recheck in 1 month. Patient wife agreed and also scheduled Flu shot appointments.

## 2013-10-07 ENCOUNTER — Ambulatory Visit: Payer: Self-pay

## 2013-10-10 ENCOUNTER — Ambulatory Visit (INDEPENDENT_AMBULATORY_CARE_PROVIDER_SITE_OTHER): Payer: Medicare Other

## 2013-10-10 DIAGNOSIS — Z23 Encounter for immunization: Secondary | ICD-10-CM

## 2013-10-14 ENCOUNTER — Other Ambulatory Visit: Payer: Self-pay | Admitting: Internal Medicine

## 2013-10-24 ENCOUNTER — Other Ambulatory Visit: Payer: Medicare Other

## 2013-10-24 DIAGNOSIS — I4891 Unspecified atrial fibrillation: Secondary | ICD-10-CM

## 2013-10-24 DIAGNOSIS — Z7901 Long term (current) use of anticoagulants: Secondary | ICD-10-CM

## 2013-10-25 LAB — PROTIME-INR
INR: 2.2 — ABNORMAL HIGH (ref 0.8–1.2)
Prothrombin Time: 22.9 s — ABNORMAL HIGH (ref 9.1–12.0)

## 2013-11-13 ENCOUNTER — Other Ambulatory Visit: Payer: Self-pay | Admitting: *Deleted

## 2013-11-13 MED ORDER — SIMVASTATIN 40 MG PO TABS: ORAL_TABLET | ORAL | Status: DC

## 2013-11-13 NOTE — Telephone Encounter (Signed)
[facility]  

## 2013-11-24 ENCOUNTER — Other Ambulatory Visit: Payer: Medicare Other

## 2013-11-24 DIAGNOSIS — Z7901 Long term (current) use of anticoagulants: Secondary | ICD-10-CM

## 2013-11-24 DIAGNOSIS — I4891 Unspecified atrial fibrillation: Secondary | ICD-10-CM

## 2013-11-25 LAB — PROTIME-INR
INR: 2.3 — ABNORMAL HIGH (ref 0.8–1.2)
Prothrombin Time: 24.2 s — ABNORMAL HIGH (ref 9.1–12.0)

## 2013-12-15 ENCOUNTER — Other Ambulatory Visit: Payer: Self-pay | Admitting: Internal Medicine

## 2013-12-23 ENCOUNTER — Other Ambulatory Visit: Payer: Medicare Other

## 2013-12-23 DIAGNOSIS — I5022 Chronic systolic (congestive) heart failure: Secondary | ICD-10-CM

## 2013-12-23 DIAGNOSIS — I4891 Unspecified atrial fibrillation: Secondary | ICD-10-CM

## 2013-12-23 DIAGNOSIS — Z7901 Long term (current) use of anticoagulants: Secondary | ICD-10-CM

## 2013-12-24 LAB — CBC WITH DIFFERENTIAL
Basophils Absolute: 0 10*3/uL (ref 0.0–0.2)
Basos: 1 %
Eos: 4 %
Eosinophils Absolute: 0.2 10*3/uL (ref 0.0–0.4)
HCT: 41.1 % (ref 37.5–51.0)
Hemoglobin: 13.9 g/dL (ref 12.6–17.7)
Immature Grans (Abs): 0 10*3/uL (ref 0.0–0.1)
Immature Granulocytes: 0 %
Lymphocytes Absolute: 1.2 10*3/uL (ref 0.7–3.1)
Lymphs: 20 %
MCH: 32.2 pg (ref 26.6–33.0)
MCHC: 33.8 g/dL (ref 31.5–35.7)
MCV: 95 fL (ref 79–97)
Monocytes Absolute: 0.7 10*3/uL (ref 0.1–0.9)
Monocytes: 12 %
Neutrophils Absolute: 3.6 10*3/uL (ref 1.4–7.0)
Neutrophils Relative %: 63 %
Platelets: 295 10*3/uL (ref 150–379)
RBC: 4.32 x10E6/uL (ref 4.14–5.80)
RDW: 13.5 % (ref 12.3–15.4)
WBC: 5.7 10*3/uL (ref 3.4–10.8)

## 2013-12-24 LAB — BASIC METABOLIC PANEL
BUN/Creatinine Ratio: 9 — ABNORMAL LOW (ref 10–22)
BUN: 8 mg/dL — ABNORMAL LOW (ref 10–36)
CO2: 23 mmol/L (ref 18–29)
Calcium: 9 mg/dL (ref 8.6–10.2)
Chloride: 96 mmol/L — ABNORMAL LOW (ref 97–108)
Creatinine, Ser: 0.85 mg/dL (ref 0.76–1.27)
GFR calc Af Amer: 88 mL/min/{1.73_m2} (ref >59)
GFR calc non Af Amer: 76 mL/min/{1.73_m2} (ref >59)
Glucose: 85 mg/dL (ref 65–99)
Potassium: 4.2 mmol/L (ref 3.5–5.2)
Sodium: 132 mmol/L — ABNORMAL LOW (ref 134–144)

## 2013-12-24 LAB — PROTIME-INR
INR: 2.4 — ABNORMAL HIGH (ref 0.8–1.2)
Prothrombin Time: 25.4 s — ABNORMAL HIGH (ref 9.1–12.0)

## 2013-12-26 ENCOUNTER — Encounter: Payer: Self-pay | Admitting: Internal Medicine

## 2013-12-26 ENCOUNTER — Ambulatory Visit (INDEPENDENT_AMBULATORY_CARE_PROVIDER_SITE_OTHER): Payer: Medicare Other | Admitting: Internal Medicine

## 2013-12-26 VITALS — BP 112/64 | HR 62 | Temp 98.2°F | Resp 10 | Ht 73.0 in | Wt 200.0 lb

## 2013-12-26 DIAGNOSIS — E785 Hyperlipidemia, unspecified: Secondary | ICD-10-CM

## 2013-12-26 DIAGNOSIS — I480 Paroxysmal atrial fibrillation: Secondary | ICD-10-CM

## 2013-12-26 DIAGNOSIS — I10 Essential (primary) hypertension: Secondary | ICD-10-CM

## 2013-12-26 DIAGNOSIS — F0150 Vascular dementia without behavioral disturbance: Secondary | ICD-10-CM

## 2013-12-26 DIAGNOSIS — Z5181 Encounter for therapeutic drug level monitoring: Secondary | ICD-10-CM

## 2013-12-26 DIAGNOSIS — M170 Bilateral primary osteoarthritis of knee: Secondary | ICD-10-CM

## 2013-12-26 DIAGNOSIS — I5022 Chronic systolic (congestive) heart failure: Secondary | ICD-10-CM

## 2013-12-26 MED ORDER — FUROSEMIDE 20 MG PO TABS: ORAL_TABLET | ORAL | Status: DC

## 2013-12-26 MED ORDER — POTASSIUM CHLORIDE ER 10 MEQ PO TBCR: EXTENDED_RELEASE_TABLET | ORAL | Status: DC

## 2013-12-26 NOTE — Progress Notes (Signed)
Patient ID: Troy Freeman, male   DOB: 03-03-1920, 80 y.o.   MRN: [id number]   Location:  [facility]  Code Status: DNR  Allergies  Allergen Reactions  . Aricept [Donepezil]     lethargy  . Codeine Nausea And Vomiting  . Coreg [Carvedilol] Other (See Comments)    Bradycardia   . Penicillins Rash  . Tramadol Nausea Only    Chief Complaint  Patient presents with  . Medical Management of Chronic Issues    HPI: Patient is a 80 y.o. white male seen in the office today for medical mgt of chronic diseases.    Knees hurt as usual.  He slid off the bed.  Turned while sitting on edge and slid right down hitting the table, teeth fell out and his nose bled, left arm had a skin tear.   He's enjoying doing colorama--coloring.   Stopped going out as much due to his difficulty walking He cannot hear well at all and amplification device was ineffective. Rides his bike 3-4 times per day. Memory gradually declining.  Review of Systems:  Review of Systems  Constitutional: Negative for fever and malaise/fatigue.  HENT: Positive for hearing loss. Negative for congestion.   Eyes: Negative for blurred vision.  Respiratory: Negative for shortness of breath.   Cardiovascular: Negative for chest pain and leg swelling.       Some edema in evenings  Gastrointestinal: Negative for abdominal pain, constipation, blood in stool and melena.  Genitourinary: Negative for dysuria.  Musculoskeletal: Positive for falls.       Slid off side of bed  Skin: Negative for rash.  Neurological: Positive for weakness. Negative for dizziness, loss of consciousness and headaches.  Endo/Heme/Allergies: Does not bruise/bleed easily.  Psychiatric/Behavioral: Negative for depression and memory loss.     Past Medical History  Diagnosis Date  . Bradycardia   . IHD (ischemic heart disease) July 2002    Large anterior MI with PCI to LAD with subsequent CABG x 6 in 2002  .  LV dysfunction     EF is 30 to 35%; does not tolerate ACE due to cough and dizziness  . MI, old     OLD ANTERIOR MI  . Atrial fibrillation     managed on coumadin and with rate control  . S/P CABG (coronary artery bypass graft) 2002  . Fall Aug 2012    with fractured right hip  . BPH (benign prostatic hyperplasia)   . GERD (gastroesophageal reflux disease)   . Dementia     mild  . Chronic anticoagulation   . Hyperlipidemia   . Hypertension   . Arthritis     Osteoarthritis  . Syncope 09/11/2008  . Osteoarthrosis, unspecified whether generalized or localized, unspecified site   . Long term (current) use of anticoagulants   . Congestive heart failure, unspecified   . Senile osteoporosis   . Other B-complex deficiencies   . Unspecified vitamin D deficiency   . Abnormality of gait   . Anemia, unspecified     Past Surgical History  Procedure Laterality Date  . Coronary angioplasty  07/2000    LAD  . Coronary artery bypass graft  08/2000    LIMA to LAD, SVG to RCA and PD, SVG to OM1 and OM2, and SVG to DX  . Right hip surgery Right 1996 & 2012    Replacement  . Left hip surgery Left 04/18/2005    Total replacement  . Knee  arthroscopy Right     Arthroscopic surgery  . Eye surgery  2001    Cataract surgery  . Eye surgery  2006    Cataract surgery  . Esophagogastroduodenoscopy endoscopy    . Colon surgery  2004    Colonoscopy  . Colon surgery  10/29/2003    Colonoscopy    Social History:   reports that he quit smoking about 56 years ago. His smoking use included Cigarettes. He has a 30 pack-year smoking history. He does not have any smokeless tobacco history on file. He reports that he does not drink alcohol or use illicit drugs.  Family History  Problem Relation Age of Onset  . Cancer Mother   . Stroke Mother   . Heart disease Father   . Stroke Father   . Cancer Sister   . Cancer Brother   . Early death Brother     Medications: Patient's Medications  New  Prescriptions   No medications on file  Previous Medications   ACETAMINOPHEN (TYLENOL 8 HOUR PO)    Take 1,000 mg by mouth 2 (two) times daily.    CALCIUM CARBONATE-VITAMIN D (CALCIUM + D PO)    Take 600 mg by mouth daily. 3 TABS DAILY    COUMADIN 1 MG TABLET    TAKE 4 TABLETS ON FRIDAY AND MONDAY, AND 3 TABLETS ON TUES, WED, THURS, SATURDAY, AND SUNDAY.   FUROSEMIDE (LASIX) 20 MG TABLET    TAKE 1 TABLET ONCE DAILY.   MAGNESIUM OXIDE 400 (240 MG) MG TABS    TAKE 1 TABLET DAILY.   NITROGLYCERIN (NITROSTAT) 0.4 MG SL TABLET    Place 1 tablet (0.4 mg total) under the tongue every 5 (five) minutes as needed for chest pain.   OMEPRAZOLE (PRILOSEC) 20 MG CAPSULE    TAKE 1 CAPSULE DAILY FOR STOMACH ACID   POTASSIUM CHLORIDE (K-DUR) 10 MEQ TABLET    TAKE 1 TABLET TWICE DAILY.   SIMVASTATIN (ZOCOR) 40 MG TABLET    Take one tablet by mouth once daily for cholesterol  Modified Medications   No medications on file  Discontinued Medications   No medications on file     Physical Exam: Filed Vitals:   12/26/13 1006  BP: 112/64  Pulse: 62  Temp: 98.2 F (36.8 C)  TempSrc: Oral  Resp: 10  Height: 6\' 1"  (1.854 m)  Weight: 200 lb (90.719 kg)  SpO2: 93%  Physical Exam  Constitutional: He appears well-developed and well-nourished. No distress.  HENT:  HOH  Cardiovascular: Normal heart sounds and intact distal pulses.   irreg irreg  Pulmonary/Chest: Effort normal and breath sounds normal.  Abdominal: Soft. Bowel sounds are normal. He exhibits no distension and no mass. There is no tenderness.  Neurological: He is alert.  Skin: Skin is warm and dry.  Psychiatric: He has a normal mood and affect.    Labs reviewed: Basic Metabolic Panel:  Recent Labs  02/18/13 1159 05/09/13 0959 12/23/13 0944  NA 130* 134 132*  K 4.5 4.2 4.2  CL 95* 93* 96*  CO2 27 21 23   GLUCOSE 83 92 85  BUN 15 13 8*  CREATININE 1.0 0.86 0.85  CALCIUM 9.3 9.2 9.0   Liver Function Tests:  Recent Labs   05/09/13 0959  AST 32  ALT 18  ALKPHOS 51  BILITOT 0.7  PROT 7.0  CBC:  Recent Labs  02/08/13 1036 05/09/13 0959 12/23/13 0944  WBC 4.7 5.4 5.7  NEUTROABS 2.7 3.3 3.6  HGB 12.9*  14.5 13.9  HCT 37.0* 39.8 41.1  MCV 90.9 88 95  PLT 223  --  295   Lipid Panel:  Recent Labs  05/09/13 0959  HDL 45  LDLCALC 77  TRIG 127  CHOLHDL 3.3    Assessment/Plan 1. Primary osteoarthritis of both knees -stable, cont tylenol 1g bid--can take three times daily, but his wife does not generally need to give it that often -chose not to use other therapy like voltaren gel or injections--said not to bother him that much, but his wife complains he won't walk much anymore  2. Systolic CHF, chronic -cont lasix only if he gains 4lbs in 24 hrs--rarely needed  -cont kcl only when takes lasix--in the new year, his insurance is only going to cover the ER version of potassium--when I tried to order that it came up the same as the previous order so Nyoka Cowden is going to check with the pharmacist to see if it makes a difference in cost b/c they hardly use it  3. Vascular dementia, without behavioral disturbance -is slowly progressive, walking less (my be partially due to dementia not just his OA)  4. PAF (paroxysmal atrial fibrillation) -cont coumadin 1mg  daily with monthly INRs and goal 2-3 - CBC With differential/Platelet; Future - Comprehensive metabolic panel; Future  5. Essential hypertension, benign -bp at goal--has not been a problem in many years  26. Hyperlipidemia - cont zocor 40mg  qhs  - Comprehensive metabolic panel; Future - Lipid panel; Future - Hemoglobin A1c; Future  7. Encounter for medication monitoring - will check mag level as he takes this regularly  - Magnesium; Future  Labs/tests ordered:   Orders Placed This Encounter  Procedures  . CBC With differential/Platelet    Standing Status: Future     Number of Occurrences:      Standing Expiration Date: 12/27/2014  .  Comprehensive metabolic panel    Standing Status: Future     Number of Occurrences:      Standing Expiration Date: 12/27/2014    Order Specific Question:  Has the patient fasted?    Answer:  Yes  . Lipid panel    Standing Status: Future     Number of Occurrences:      Standing Expiration Date: 12/27/2014    Order Specific Question:  Has the patient fasted?    Answer:  Yes  . Hemoglobin A1c    Standing Status: Future     Number of Occurrences:      Standing Expiration Date: 12/27/2014  . Magnesium    Standing Status: Future     Number of Occurrences:      Standing Expiration Date: 12/27/2014   Next appt:  6 mos with labs before  Rosamae Rocque L. Ellakate Gonsalves, D.O. [facility] [address], [place] [address] Cell Phone (Mon-Fri 8am-5pm):  [phone] On Call:  [phone] & follow prompts after 5pm & weekends Office Phone:  [phone] Office Fax:  [phone]

## 2014-01-07 ENCOUNTER — Ambulatory Visit (INDEPENDENT_AMBULATORY_CARE_PROVIDER_SITE_OTHER): Payer: Medicare Other | Admitting: Cardiology

## 2014-01-07 ENCOUNTER — Encounter: Payer: Self-pay | Admitting: Cardiology

## 2014-01-07 VITALS — BP 114/70 | HR 86 | Ht 73.0 in | Wt 200.6 lb

## 2014-01-07 DIAGNOSIS — I10 Essential (primary) hypertension: Secondary | ICD-10-CM

## 2014-01-07 DIAGNOSIS — I482 Chronic atrial fibrillation, unspecified: Secondary | ICD-10-CM

## 2014-01-07 DIAGNOSIS — I519 Heart disease, unspecified: Secondary | ICD-10-CM

## 2014-01-07 DIAGNOSIS — I5022 Chronic systolic (congestive) heart failure: Secondary | ICD-10-CM

## 2014-01-07 DIAGNOSIS — I255 Ischemic cardiomyopathy: Secondary | ICD-10-CM

## 2014-01-07 NOTE — Progress Notes (Signed)
Troy Freeman Date of Birth: September 06, 1920 Medical Record #[id number]  History of Present Illness: Troy Freeman is seen for a followup visit. He is 89. He has known CAD with LV dysfunction. He has been managed conservatively. His other problems includes chronic atrial fib, coumadin therapy and progressive dementia. His wife provides around the clock care for him. He is quite hard of hearing.  He has a history of severe bradycardia on beta blockers and hypotension on ACEi/ARB therapy. No increased SOB. He does note some dyspnea and decreased energy when he walks. He has chronic edema worse in the evening. He takes lasix about 3 days per week. Denies chest pain.  Current Outpatient Prescriptions on File Prior to Visit  Medication Sig Dispense Refill  . Acetaminophen (TYLENOL 8 HOUR PO) Take 1,000 mg by mouth 2 (two) times daily.     . Calcium Carbonate-Vitamin D (CALCIUM + D PO) Take 600 mg by mouth daily. 3 TABS DAILY     . COUMADIN 1 MG tablet TAKE 4 TABLETS ON FRIDAY AND MONDAY, AND 3 TABLETS ON TUES, WED, THURS, SATURDAY, AND SUNDAY. 99 tablet 0  . furosemide (LASIX) 20 MG tablet TAKE 1 TABLET ONCE DAILY. Only takes if weight gain 4 lbs in 24 hours 30 tablet 2  . Magnesium Oxide 400 (240 MG) MG TABS TAKE 1 TABLET DAILY. 90 tablet 1  . nitroGLYCERIN (NITROSTAT) 0.4 MG SL tablet Place 1 tablet (0.4 mg total) under the tongue every 5 (five) minutes as needed for chest pain. 25 tablet 3  . omeprazole (PRILOSEC) 20 MG capsule TAKE 1 CAPSULE DAILY FOR STOMACH ACID 30 capsule 3  . potassium chloride (K-DUR) 10 MEQ tablet TAKE 1 TABLET TWICE DAILY. Only when taking lasix 60 tablet 2  . simvastatin (ZOCOR) 40 MG tablet Take one tablet by mouth once daily for cholesterol 90 tablet 3  . [DISCONTINUED] magnesium oxide (MAG-OX) 400 MG tablet Take 1 tablet (400 mg total) by mouth daily. 90 tablet 1   No current facility-administered medications on file prior to visit.    Allergies  Allergen  Reactions  . Aricept [Donepezil]     lethargy  . Codeine Nausea And Vomiting  . Coreg [Carvedilol] Other (See Comments)    Bradycardia   . Penicillins Rash  . Tramadol Nausea Only    Past Medical History  Diagnosis Date  . Bradycardia   . IHD (ischemic heart disease) July 2002    Large anterior MI with PCI to LAD with subsequent CABG x 6 in 2002  . LV dysfunction     EF is 30 to 35%; does not tolerate ACE due to cough and dizziness  . MI, old     OLD ANTERIOR MI  . Atrial fibrillation     managed on coumadin and with rate control  . S/P CABG (coronary artery bypass graft) 2002  . Fall Aug 2012    with fractured right hip  . BPH (benign prostatic hyperplasia)   . GERD (gastroesophageal reflux disease)   . Dementia     mild  . Chronic anticoagulation   . Hyperlipidemia   . Hypertension   . Arthritis     Osteoarthritis  . Syncope 09/11/2008  . Osteoarthrosis, unspecified whether generalized or localized, unspecified site   . Long term (current) use of anticoagulants   . Congestive heart failure, unspecified   . Senile osteoporosis   . Other B-complex deficiencies   . Unspecified vitamin D deficiency   . Abnormality of  gait   . Anemia, unspecified     Past Surgical History  Procedure Laterality Date  . Coronary angioplasty  07/2000    LAD  . Coronary artery bypass graft  08/2000    LIMA to LAD, SVG to RCA and PD, SVG to OM1 and OM2, and SVG to DX  . Right hip surgery Right 1996 & 2012    Replacement  . Left hip surgery Left 04/18/2005    Total replacement  . Knee arthroscopy Right     Arthroscopic surgery  . Eye surgery  2001    Cataract surgery  . Eye surgery  2006    Cataract surgery  . Esophagogastroduodenoscopy endoscopy    . Colon surgery  2004    Colonoscopy  . Colon surgery  10/29/2003    Colonoscopy    History  Smoking status  . Former Smoker -- 1.00 packs/day for 30 years  . Types: Cigarettes  . Quit date: 01/09/1958  Smokeless tobacco  .  Not on file    History  Alcohol Use No    Family History  Problem Relation Age of Onset  . Cancer Mother   . Stroke Mother   . Heart disease Father   . Stroke Father   . Cancer Sister   . Cancer Brother   . Early death Brother     Review of Systems: The review of systems is per the HPI.  All other systems were reviewed and are negative.  Physical Exam: BP 114/70 mmHg  Pulse 86  Ht 6\' 1"  (1.854 m)  Wt 200 lb 9 oz (90.975 kg)  BMI 26.47 kg/m2 Patient is very pleasant and in no acute distress. He is quite hard of hearing. Skin is warm and dry. Color is normal.  HEENT is unremarkable. Normocephalic/atraumatic. PERRL. Sclera are nonicteric. Neck is supple. No masses. No JVD. Lungs are clear. Cardiac exam shows an irregular rhythm. His rate is controlled.  Old sternotomy scar. Normal S1-2 without murmur. Abdomen is soft. Extremities reveal 2+ left ankle edema and 1+ on the right. Gait and ROM are intact. He is using a walker. No gross neurologic deficits noted.  LABORATORY DATA:    Lab Results  Component Value Date   WBC 5.7 12/23/2013   HGB 13.9 12/23/2013   HCT 41.1 12/23/2013   PLT 295 12/23/2013   GLUCOSE 85 12/23/2013   CHOL 145 08/21/2010   TRIG 127 05/09/2013   HDL 45 05/09/2013   LDLCALC 77 05/09/2013   ALT 18 05/09/2013   AST 32 05/09/2013   NA 132* 12/23/2013   K 4.2 12/23/2013   CL 96* 12/23/2013   CREATININE 0.85 12/23/2013   BUN 8* 12/23/2013   CO2 23 12/23/2013   TSH 3.488 08/21/2010   INR 2.4* 12/23/2013     Assessment / Plan: 1. Bradycardia -resolved since beta blocker discontinued.   2. CAD -s/p CABG- no chest pain. Conservative therapy.  3. Atrial fib - rate is controlled. He is on chronic anticoagulation with coumadin. INR therapeutic at 2.4.   4. Ischemic cardiomyopathy EF 35%. Asymptomatic. Intolerant of meds as noted above. Continue to monitor. Using lasix prn

## 2014-01-07 NOTE — Patient Instructions (Signed)
Continue your current therapy  Use the lasix as needed for swelling  I will see you in 6 months.

## 2014-01-19 ENCOUNTER — Other Ambulatory Visit: Payer: Self-pay

## 2014-01-19 MED ORDER — WARFARIN SODIUM 1 MG PO TABS: ORAL_TABLET | ORAL | Status: DC

## 2014-01-19 NOTE — Telephone Encounter (Signed)
Patient's wife called requesting refill on blood thinner, Mrs.Kataoka already called the pharmacy and went by there x 2.

## 2014-01-26 ENCOUNTER — Other Ambulatory Visit: Payer: Medicare Other

## 2014-01-26 DIAGNOSIS — Z7901 Long term (current) use of anticoagulants: Secondary | ICD-10-CM | POA: Diagnosis not present

## 2014-01-26 DIAGNOSIS — I4891 Unspecified atrial fibrillation: Secondary | ICD-10-CM | POA: Diagnosis not present

## 2014-01-27 LAB — PROTIME-INR
INR: 2 — ABNORMAL HIGH (ref 0.8–1.2)
Prothrombin Time: 21.2 s — ABNORMAL HIGH (ref 9.1–12.0)

## 2014-02-10 ENCOUNTER — Other Ambulatory Visit: Payer: Self-pay | Admitting: Nurse Practitioner

## 2014-02-25 ENCOUNTER — Other Ambulatory Visit: Payer: Medicare Other

## 2014-02-25 DIAGNOSIS — I4891 Unspecified atrial fibrillation: Secondary | ICD-10-CM

## 2014-02-26 LAB — PROTIME-INR
INR: 2.3 — ABNORMAL HIGH (ref 0.8–1.2)
Prothrombin Time: 23.7 s — ABNORMAL HIGH (ref 9.1–12.0)

## 2014-03-17 ENCOUNTER — Other Ambulatory Visit: Payer: Self-pay | Admitting: Internal Medicine

## 2014-03-25 ENCOUNTER — Other Ambulatory Visit: Payer: Medicare Other

## 2014-03-25 DIAGNOSIS — I4891 Unspecified atrial fibrillation: Secondary | ICD-10-CM | POA: Diagnosis not present

## 2014-03-26 LAB — PROTIME-INR
INR: 2.4 — ABNORMAL HIGH (ref 0.8–1.2)
Prothrombin Time: 24.9 s — ABNORMAL HIGH (ref 9.1–12.0)

## 2014-04-20 ENCOUNTER — Other Ambulatory Visit: Payer: Self-pay | Admitting: Internal Medicine

## 2014-04-23 DIAGNOSIS — H04123 Dry eye syndrome of bilateral lacrimal glands: Secondary | ICD-10-CM | POA: Diagnosis not present

## 2014-04-27 ENCOUNTER — Other Ambulatory Visit: Payer: Medicare Other

## 2014-04-27 DIAGNOSIS — I4891 Unspecified atrial fibrillation: Secondary | ICD-10-CM

## 2014-04-27 DIAGNOSIS — Z7901 Long term (current) use of anticoagulants: Secondary | ICD-10-CM

## 2014-04-28 LAB — PROTIME-INR
INR: 1.9 — ABNORMAL HIGH (ref 0.8–1.2)
Prothrombin Time: 20.4 s — ABNORMAL HIGH (ref 9.1–12.0)

## 2014-05-18 ENCOUNTER — Other Ambulatory Visit: Payer: Self-pay | Admitting: Internal Medicine

## 2014-05-25 ENCOUNTER — Other Ambulatory Visit: Payer: Medicare Other

## 2014-05-25 DIAGNOSIS — I4891 Unspecified atrial fibrillation: Secondary | ICD-10-CM

## 2014-05-26 LAB — PROTIME-INR
INR: 2.3 — ABNORMAL HIGH (ref 0.8–1.2)
Prothrombin Time: 23.8 s — ABNORMAL HIGH (ref 9.1–12.0)

## 2014-06-24 DIAGNOSIS — D1801 Hemangioma of skin and subcutaneous tissue: Secondary | ICD-10-CM | POA: Diagnosis not present

## 2014-06-24 DIAGNOSIS — L821 Other seborrheic keratosis: Secondary | ICD-10-CM | POA: Diagnosis not present

## 2014-06-24 DIAGNOSIS — L570 Actinic keratosis: Secondary | ICD-10-CM | POA: Diagnosis not present

## 2014-06-29 ENCOUNTER — Other Ambulatory Visit: Payer: Medicare Other

## 2014-06-29 DIAGNOSIS — E785 Hyperlipidemia, unspecified: Secondary | ICD-10-CM

## 2014-06-29 DIAGNOSIS — I4891 Unspecified atrial fibrillation: Secondary | ICD-10-CM | POA: Diagnosis not present

## 2014-06-29 DIAGNOSIS — Z5181 Encounter for therapeutic drug level monitoring: Secondary | ICD-10-CM | POA: Diagnosis not present

## 2014-06-29 DIAGNOSIS — I480 Paroxysmal atrial fibrillation: Secondary | ICD-10-CM

## 2014-06-30 LAB — COMPREHENSIVE METABOLIC PANEL
ALT: 15 IU/L (ref 0–44)
AST: 25 IU/L (ref 0–40)
Albumin/Globulin Ratio: 2.2 (ref 1.1–2.5)
Albumin: 4.8 g/dL — ABNORMAL HIGH (ref 3.2–4.6)
Alkaline Phosphatase: 50 IU/L (ref 39–117)
BUN/Creatinine Ratio: 13 (ref 10–22)
BUN: 12 mg/dL (ref 10–36)
Bilirubin Total: 0.9 mg/dL (ref 0.0–1.2)
CO2: 22 mmol/L (ref 18–29)
Calcium: 9.5 mg/dL (ref 8.6–10.2)
Chloride: 97 mmol/L (ref 97–108)
Creatinine, Ser: 0.9 mg/dL (ref 0.76–1.27)
GFR calc Af Amer: 85 mL/min/{1.73_m2} (ref >59)
GFR calc non Af Amer: 74 mL/min/{1.73_m2} (ref >59)
Globulin, Total: 2.2 g/dL (ref 1.5–4.5)
Glucose: 89 mg/dL (ref 65–99)
Potassium: 4.2 mmol/L (ref 3.5–5.2)
Sodium: 136 mmol/L (ref 134–144)
Total Protein: 7 g/dL (ref 6.0–8.5)

## 2014-06-30 LAB — CBC WITH DIFFERENTIAL
Basophils Absolute: 0 10*3/uL (ref 0.0–0.2)
Basos: 1 %
EOS (ABSOLUTE): 0.1 10*3/uL (ref 0.0–0.4)
Eos: 2 %
Hematocrit: 42.9 % (ref 37.5–51.0)
Hemoglobin: 14.6 g/dL (ref 12.6–17.7)
Immature Grans (Abs): 0.1 10*3/uL (ref 0.0–0.1)
Immature Granulocytes: 1 %
Lymphocytes Absolute: 1.3 10*3/uL (ref 0.7–3.1)
Lymphs: 20 %
MCH: 31.5 pg (ref 26.6–33.0)
MCHC: 34 g/dL (ref 31.5–35.7)
MCV: 93 fL (ref 79–97)
Monocytes Absolute: 0.7 10*3/uL (ref 0.1–0.9)
Monocytes: 11 %
Neutrophils Absolute: 4.1 10*3/uL (ref 1.4–7.0)
Neutrophils: 65 %
RBC: 4.63 x10E6/uL (ref 4.14–5.80)
RDW: 13.7 % (ref 12.3–15.4)
WBC: 6.2 10*3/uL (ref 3.4–10.8)

## 2014-06-30 LAB — HEMOGLOBIN A1C
Est. average glucose Bld gHb Est-mCnc: 126 mg/dL
Hgb A1c MFr Bld: 6 % — ABNORMAL HIGH (ref 4.8–5.6)

## 2014-06-30 LAB — LIPID PANEL
Chol/HDL Ratio: 2.9 ratio units (ref 0.0–5.0)
Cholesterol, Total: 149 mg/dL (ref 100–199)
HDL: 52 mg/dL (ref >39)
LDL Calculated: 72 mg/dL (ref 0–99)
Triglycerides: 125 mg/dL (ref 0–149)
VLDL Cholesterol Cal: 25 mg/dL (ref 5–40)

## 2014-06-30 LAB — PROTIME-INR
INR: 2.3 — ABNORMAL HIGH (ref 0.8–1.2)
Prothrombin Time: 24.1 s — ABNORMAL HIGH (ref 9.1–12.0)

## 2014-06-30 LAB — MAGNESIUM: Magnesium: 1.6 mg/dL (ref 1.6–2.3)

## 2014-07-02 ENCOUNTER — Encounter: Payer: Self-pay | Admitting: Internal Medicine

## 2014-07-02 ENCOUNTER — Ambulatory Visit (INDEPENDENT_AMBULATORY_CARE_PROVIDER_SITE_OTHER): Payer: Medicare Other | Admitting: Internal Medicine

## 2014-07-02 VITALS — BP 148/88 | HR 70 | Temp 97.6°F | Resp 20 | Ht 73.0 in | Wt 200.0 lb

## 2014-07-02 DIAGNOSIS — F0150 Vascular dementia without behavioral disturbance: Secondary | ICD-10-CM

## 2014-07-02 DIAGNOSIS — M6281 Muscle weakness (generalized): Secondary | ICD-10-CM

## 2014-07-02 DIAGNOSIS — I10 Essential (primary) hypertension: Secondary | ICD-10-CM

## 2014-07-02 DIAGNOSIS — M6289 Other specified disorders of muscle: Secondary | ICD-10-CM

## 2014-07-02 DIAGNOSIS — I5022 Chronic systolic (congestive) heart failure: Secondary | ICD-10-CM | POA: Diagnosis not present

## 2014-07-02 DIAGNOSIS — R739 Hyperglycemia, unspecified: Secondary | ICD-10-CM

## 2014-07-02 DIAGNOSIS — E785 Hyperlipidemia, unspecified: Secondary | ICD-10-CM | POA: Diagnosis not present

## 2014-07-02 DIAGNOSIS — I480 Paroxysmal atrial fibrillation: Secondary | ICD-10-CM

## 2014-07-02 NOTE — Progress Notes (Signed)
Patient ID: Troy Freeman, male   DOB: Aug 23, 1920, 81 y.o.   MRN: [id number]   Location:  [facility]  Code Status: DNR Goals of Care: Advanced Directive information Does patient have an advance directive?: Yes, Type of Advance Directive: Out of facility DNR (pink MOST or yellow form), Pre-existing out of facility DNR order (yellow form or pink MOST form): Pink MOST form placed in chart (order not valid for inpatient use)   Chief Complaint  Patient presents with  . Medical Management of Chronic Issues    6 month follow-up, labs printed    HPI: Patient is a 80 y.o. white male seen in the office today for med mgt of chronic diseases.  He is accompanied by his wife and 24x7 caregiver, Dixie.    Says he's doing all right as far as he knows.  Is short of breath when he walks a long distance.  Knees hurt him, but all of these are ongoing.  Sits a lot and ankles swell.  Weighed 196 at home.  If 199, he gets lasix. They have no new concerns.  He continues to decline.  He continues to walk with his walker pushed out several feet in front of him and needs reminding to stand up straight.  He was agitated b/c they had to wait to see me today.  Dixie says her daughter is upset that he can't get more PT for muscle strengthening, but he's already been and they said they couldn't help him anymore.  He remains on coumadin and his INR has been therapeutic for several months without a need to change his dosage.  Review of Systems:  Review of Systems  Constitutional: Positive for malaise/fatigue. Negative for fever and chills.  HENT: Positive for hearing loss.   Eyes: Negative for blurred vision.       Glasses  Respiratory: Positive for shortness of breath.        On exertion  Cardiovascular: Positive for leg swelling. Negative for chest pain and palpitations.  Gastrointestinal: Negative for abdominal pain, constipation, blood in stool and melena.    Genitourinary: Negative for dysuria.  Musculoskeletal: Positive for back pain. Negative for falls.  Neurological: Positive for weakness. Negative for dizziness and loss of consciousness.       Dementia; needs some assist with adls except feeding  Endo/Heme/Allergies: Bruises/bleeds easily.  Psychiatric/Behavioral: Positive for memory loss.    Past Medical History  Diagnosis Date  . Bradycardia   . IHD (ischemic heart disease) July 2002    Large anterior MI with PCI to LAD with subsequent CABG x 6 in 2002  . LV dysfunction     EF is 30 to 35%; does not tolerate ACE due to cough and dizziness  . MI, old     OLD ANTERIOR MI  . Atrial fibrillation     managed on coumadin and with rate control  . S/P CABG (coronary artery bypass graft) 2002  . Fall Aug 2012    with fractured right hip  . BPH (benign prostatic hyperplasia)   . GERD (gastroesophageal reflux disease)   . Dementia     mild  . Chronic anticoagulation   . Hyperlipidemia   . Hypertension   . Arthritis     Osteoarthritis  . Syncope 09/11/2008  . Osteoarthrosis, unspecified whether generalized or localized, unspecified site   . Long term (current) use of anticoagulants   . Congestive heart failure, unspecified   . Senile osteoporosis   .  Other B-complex deficiencies   . Unspecified vitamin D deficiency   . Abnormality of gait   . Anemia, unspecified     Past Surgical History  Procedure Laterality Date  . Coronary angioplasty  07/2000    LAD  . Coronary artery bypass graft  08/2000    LIMA to LAD, SVG to RCA and PD, SVG to OM1 and OM2, and SVG to DX  . Right hip surgery Right 1996 & 2012    Replacement  . Left hip surgery Left 04/18/2005    Total replacement  . Knee arthroscopy Right     Arthroscopic surgery  . Eye surgery  2001    Cataract surgery  . Eye surgery  2006    Cataract surgery  . Esophagogastroduodenoscopy endoscopy    . Colon surgery  2004    Colonoscopy  . Colon surgery  10/29/2003     Colonoscopy    Allergies  Allergen Reactions  . Aricept [Donepezil]     lethargy  . Codeine Nausea And Vomiting  . Coreg [Carvedilol] Other (See Comments)    Bradycardia   . Penicillins Rash  . Tramadol Nausea Only   Medications: Patient's Medications  New Prescriptions   No medications on file  Previous Medications   ACETAMINOPHEN (TYLENOL 8 HOUR PO)    Take 1,000 mg by mouth 2 (two) times daily.    CALCIUM CARBONATE-VITAMIN D (CALCIUM + D PO)    Take 600 mg by mouth daily. 3 TABS DAILY    COUMADIN 1 MG TABLET    TAKE 4 TABLETS ON FRIDAY AND MONDAY, AND 3 TABLETS ON TUES, WED, THURS, SATURDAY, AND SUNDAY.   FUROSEMIDE (LASIX) 20 MG TABLET    TAKE 1 TABLET ONCE DAILY. Only takes if weight gain 4 lbs in 24 hours   MAGNESIUM OXIDE 400 (240 MG) MG TABS    TAKE 1 TABLET DAILY.   NITROGLYCERIN (NITROSTAT) 0.4 MG SL TABLET    Place 1 tablet (0.4 mg total) under the tongue every 5 (five) minutes as needed for chest pain.   OMEPRAZOLE (PRILOSEC) 20 MG CAPSULE    TAKE 1 CAPSULE DAILY FOR STOMACH ACID   POTASSIUM CHLORIDE (K-DUR) 10 MEQ TABLET    TAKE 1 TABLET TWICE DAILY. Only when taking lasix   SIMVASTATIN (ZOCOR) 40 MG TABLET    Take one tablet by mouth once daily for cholesterol  Modified Medications   No medications on file  Discontinued Medications   MAGNESIUM OXIDE 400 (240 MG) MG TABS    TAKE 1 TABLET DAILY.    Physical Exam: Filed Vitals:   07/02/14 0959  BP: 148/88  Pulse: 70  Temp: 97.6 F (36.4 C)  TempSrc: Oral  Resp: 20  Height: 6\' 1"  (1.854 m)  Weight: 200 lb (90.719 kg)  SpO2: 97%   Physical Exam  Constitutional: He appears well-developed and well-nourished. No distress.  Cardiovascular: Intact distal pulses.   irreg irreg  Pulmonary/Chest: Effort normal and breath sounds normal.  Abdominal: Soft. Bowel sounds are normal.  Musculoskeletal: Normal range of motion.  Walks with stooped posture pushing his walker as if he is mowing the grass  Neurological: He  is alert.  Poor short term memory--repeats himself frequently  Skin: Skin is warm and dry.  Psychiatric:  Agitated and ready to leave today    Labs reviewed: Basic Metabolic Panel:  Recent Labs  12/23/13 0944 06/29/14 0923  NA 132* 136  K 4.2 4.2  CL 96* 97  CO2 23 22  GLUCOSE 85 89  BUN 8* 12  CREATININE 0.85 0.90  CALCIUM 9.0 9.5  MG  --  1.6   Liver Function Tests:  Recent Labs  06/29/14 0923  AST 25  ALT 15  ALKPHOS 50  BILITOT 0.9  PROT 7.0   No results for input(s): LIPASE, AMYLASE in the last 8760 hours. No results for input(s): AMMONIA in the last 8760 hours. CBC:  Recent Labs  12/23/13 0944 06/29/14 0923  WBC 5.7 6.2  NEUTROABS 3.6 4.1  HGB 13.9  --   HCT 41.1 42.9  MCV 95  --   PLT 295  --    Lipid Panel:  Recent Labs  06/29/14 0923  CHOL 149  HDL 52  LDLCALC 72  TRIG 125  CHOLHDL 2.9   Lab Results  Component Value Date   HGBA1C 6.0* 06/29/2014    Assessment/Plan 1. Systolic CHF, chronic -chronic, stable, has prn lasix if he gains weight -is not adherent with elevating his feet at rest -Dixie tries to keep their diet low sodium except his 1/2 [employer] biscuit occasionally -not on beta blocker due to prior bradycardia - CBC with Differential/Platelet; Future - Comprehensive metabolic panel; Future  2. Vascular dementia, without behavioral disturbance -is progressing, seems he is getting more periods of agitation, he didn't tolerate aricept and have not wanted him on namenda  3. Proximal muscle weakness -had PT for this without significant improvement--will not continue his exercises after PT over -needs to stand upright closer to walker and encouraged this today  4. PAF (paroxysmal atrial fibrillation) -cont coumadin at same dosage with therapeutic INR 2-3 -he has had difficulty historically with lightheadedness on standing and severe bradycardia with beta blocker so not on this   5. Hyperlipidemia -cont zocor, f/u  labs - Comprehensive metabolic panel; Future - Lipid panel; Future  6. Essential hypertension, benign -bp was elevated today, had salty meal last night and was agitated about having to wait to see me so was 148/88 and only went up when I rechecked to 152/82 - Comprehensive metabolic panel; Future  7. Hyperglycemia - stable, f/u labs, his wife tries to keep this under control with their diet and she can't get him to walk more - Hemoglobin A1c; Future  Labs/tests ordered: Orders Placed This Encounter  Procedures  . CBC with Differential/Platelet    Standing Status: Future     Number of Occurrences:      Standing Expiration Date: 07/02/2015  . Comprehensive metabolic panel    Standing Status: Future     Number of Occurrences:      Standing Expiration Date: 07/02/2015    Order Specific Question:  Has the patient fasted?    Answer:  Yes  . Lipid panel    Standing Status: Future     Number of Occurrences:      Standing Expiration Date: 07/02/2015    Order Specific Question:  Has the patient fasted?    Answer:  Yes  . Hemoglobin A1c    Standing Status: Future     Number of Occurrences:      Standing Expiration Date: 07/02/2015    Next appt:  6 mos for annual exam with mmse  Sallyann Kinnaird L. Nikodem Leadbetter, D.O. [facility] [address], [place] [address] Cell Phone (Mon-Fri 8am-5pm):  [phone] On Call:  [phone] & follow prompts after 5pm & weekends Office Phone:  [phone] Office Fax:  [phone]

## 2014-07-05 ENCOUNTER — Encounter: Payer: Self-pay | Admitting: Internal Medicine

## 2014-07-16 ENCOUNTER — Other Ambulatory Visit: Payer: Self-pay | Admitting: Internal Medicine

## 2014-07-21 ENCOUNTER — Encounter: Payer: Self-pay | Admitting: Cardiology

## 2014-07-21 ENCOUNTER — Ambulatory Visit (INDEPENDENT_AMBULATORY_CARE_PROVIDER_SITE_OTHER): Payer: Medicare Other | Admitting: Cardiology

## 2014-07-21 VITALS — BP 138/74 | HR 62 | Ht 72.0 in | Wt 201.1 lb

## 2014-07-21 DIAGNOSIS — I482 Chronic atrial fibrillation, unspecified: Secondary | ICD-10-CM

## 2014-07-21 DIAGNOSIS — I259 Chronic ischemic heart disease, unspecified: Secondary | ICD-10-CM | POA: Diagnosis not present

## 2014-07-21 DIAGNOSIS — I10 Essential (primary) hypertension: Secondary | ICD-10-CM | POA: Diagnosis not present

## 2014-07-21 NOTE — Patient Instructions (Signed)
Continue your current therapy  I will see you in 6 months.   

## 2014-07-21 NOTE — Progress Notes (Signed)
Troy Freeman Date of Birth: 10-30-1920 Medical Record #[id number]  History of Present Illness: [name] is seen for a followup visit. He is 89. He has known CAD with LV dysfunction. He has been managed conservatively. His other problems includes chronic atrial fib, coumadin therapy and progressive dementia. His wife provides around the clock care for him. He is  hard of hearing.  He has a history of severe bradycardia on beta blockers and hypotension on ACEi/ARB therapy.  On follow up today he is holding his own. No new issues. Still limited in ability to ambulate. Walks short distances with walker.  No complaints of dyspnea or chest pain.  He has chronic edema and his wife gives him lasix prn.   Current Outpatient Prescriptions on File Prior to Visit  Medication Sig Dispense Refill  . Acetaminophen (TYLENOL 8 HOUR PO) Take 1,000 mg by mouth 2 (two) times daily.     . Calcium Carbonate-Vitamin D (CALCIUM + D PO) Take 600 mg by mouth daily. 3 TABS DAILY     . COUMADIN 1 MG tablet TAKE 4 TABLETS ON FRIDAY AND MONDAY, AND 3 TABLETS ON TUES, WED, THURS, SATURDAY, AND SUNDAY. 99 tablet 5  . furosemide (LASIX) 20 MG tablet TAKE 1 TABLET ONCE DAILY. Only takes if weight gain 4 lbs in 24 hours 30 tablet 2  . Magnesium Oxide 400 (240 MG) MG TABS TAKE 1 TABLET DAILY. 90 tablet 3  . nitroGLYCERIN (NITROSTAT) 0.4 MG SL tablet Place 1 tablet (0.4 mg total) under the tongue every 5 (five) minutes as needed for chest pain. 25 tablet 3  . omeprazole (PRILOSEC) 20 MG capsule TAKE 1 CAPSULE DAILY FOR STOMACH ACID 30 capsule 5  . potassium chloride (K-DUR) 10 MEQ tablet TAKE 1 TABLET TWICE DAILY. Only when taking lasix 60 tablet 2  . simvastatin (ZOCOR) 40 MG tablet Take one tablet by mouth once daily for cholesterol 90 tablet 3  . [DISCONTINUED] magnesium oxide (MAG-OX) 400 MG tablet Take 1 tablet (400 mg total) by mouth daily. 90 tablet 1   No current facility-administered medications on file prior  to visit.    Allergies  Allergen Reactions  . Aricept [Donepezil]     lethargy  . Codeine Nausea And Vomiting  . Coreg [Carvedilol] Other (See Comments)    Bradycardia   . Penicillins Rash  . Tramadol Nausea Only    Past Medical History  Diagnosis Date  . Bradycardia   . IHD (ischemic heart disease) July 2002    Large anterior MI with PCI to LAD with subsequent CABG x 6 in 2002  . LV dysfunction     EF is 30 to 35%; does not tolerate ACE due to cough and dizziness  . MI, old     OLD ANTERIOR MI  . Atrial fibrillation     managed on coumadin and with rate control  . S/P CABG (coronary artery bypass graft) 2002  . Fall Aug 2012    with fractured right hip  . BPH (benign prostatic hyperplasia)   . GERD (gastroesophageal reflux disease)   . Dementia     mild  . Chronic anticoagulation   . Hyperlipidemia   . Hypertension   . Arthritis     Osteoarthritis  . Syncope 09/11/2008  . Osteoarthrosis, unspecified whether generalized or localized, unspecified site   . Long term (current) use of anticoagulants   . Congestive heart failure, unspecified   . Senile osteoporosis   . Other B-complex deficiencies   .  Unspecified vitamin D deficiency   . Abnormality of gait   . Anemia, unspecified     Past Surgical History  Procedure Laterality Date  . Coronary angioplasty  07/2000    LAD  . Coronary artery bypass graft  08/2000    LIMA to LAD, SVG to RCA and PD, SVG to OM1 and OM2, and SVG to DX  . Right hip surgery Right 1996 & 2012    Replacement  . Left hip surgery Left 04/18/2005    Total replacement  . Knee arthroscopy Right     Arthroscopic surgery  . Eye surgery  2001    Cataract surgery  . Eye surgery  2006    Cataract surgery  . Esophagogastroduodenoscopy endoscopy    . Colon surgery  2004    Colonoscopy  . Colon surgery  10/29/2003    Colonoscopy    History  Smoking status  . Former Smoker -- 1.00 packs/day for 30 years  . Types: Cigarettes  . Quit date:  01/09/1958  Smokeless tobacco  . Never Used    History  Alcohol Use No    Family History  Problem Relation Age of Onset  . Cancer Mother   . Stroke Mother   . Heart disease Father   . Stroke Father   . Cancer Sister   . Cancer Brother   . Early death Brother     Review of Systems: The review of systems is per the HPI.  All other systems were reviewed and are negative.  Physical Exam: BP 138/74 mmHg  Pulse 62  Ht 6' (1.829 m)  Wt 91.218 kg (201 lb 1.6 oz)  BMI 27.27 kg/m2  SpO2 96% Patient is very pleasant and in no acute distress. He is quite hard of hearing. Skin is warm and dry. Color is normal.  HEENT is unremarkable. Normocephalic/atraumatic. PERRL. Sclera are nonicteric. Neck is supple. No masses. No JVD. Lungs are clear. Cardiac exam shows an irregular rhythm. His rate is controlled.  Old sternotomy scar. Normal S1-2 without murmur. Abdomen is soft. Extremities reveal 1+ edema. Gait and ROM are intact. He is using a walker. No gross neurologic deficits noted.  LABORATORY DATA:    Lab Results  Component Value Date   WBC 6.2 06/29/2014   HGB 13.9 12/23/2013   HCT 42.9 06/29/2014   PLT 295 12/23/2013   GLUCOSE 89 06/29/2014   CHOL 149 06/29/2014   TRIG 125 06/29/2014   HDL 52 06/29/2014   LDLCALC 72 06/29/2014   ALT 15 06/29/2014   AST 25 06/29/2014   NA 136 06/29/2014   K 4.2 06/29/2014   CL 97 06/29/2014   CREATININE 0.90 06/29/2014   BUN 12 06/29/2014   CO2 22 06/29/2014   TSH 3.488 08/21/2010   INR 2.3* 06/29/2014   HGBA1C 6.0* 06/29/2014     Assessment / Plan: 1. Bradycardia -resolved since beta blocker discontinued.   2. CAD -s/p CABG- no chest pain. Conservative therapy.  3. Atrial fib - rate is controlled. He is on chronic anticoagulation with coumadin. INR therapeutic at 2.3   4. Ischemic cardiomyopathy EF 35%. Asymptomatic. Intolerant of meds as noted above. Continue to monitor. Uselasix prn  I will follow up in 6 months.

## 2014-07-27 ENCOUNTER — Other Ambulatory Visit: Payer: Medicare Other

## 2014-07-27 DIAGNOSIS — I4891 Unspecified atrial fibrillation: Secondary | ICD-10-CM | POA: Diagnosis not present

## 2014-07-28 LAB — PROTIME-INR
INR: 2.3 — ABNORMAL HIGH (ref 0.8–1.2)
Prothrombin Time: 23.9 s — ABNORMAL HIGH (ref 9.1–12.0)

## 2014-08-27 ENCOUNTER — Other Ambulatory Visit: Payer: Medicare Other

## 2014-08-27 DIAGNOSIS — I4891 Unspecified atrial fibrillation: Secondary | ICD-10-CM

## 2014-08-28 LAB — PROTIME-INR
INR: 2 — ABNORMAL HIGH (ref 0.8–1.2)
Prothrombin Time: 20.6 s — ABNORMAL HIGH (ref 9.1–12.0)

## 2014-09-25 ENCOUNTER — Other Ambulatory Visit: Payer: Medicare Other

## 2014-09-25 ENCOUNTER — Ambulatory Visit (INDEPENDENT_AMBULATORY_CARE_PROVIDER_SITE_OTHER): Payer: Medicare Other

## 2014-09-25 DIAGNOSIS — I4891 Unspecified atrial fibrillation: Secondary | ICD-10-CM

## 2014-09-25 DIAGNOSIS — Z23 Encounter for immunization: Secondary | ICD-10-CM | POA: Diagnosis not present

## 2014-09-26 LAB — PROTIME-INR
INR: 2 — ABNORMAL HIGH (ref 0.8–1.2)
Prothrombin Time: 20 s — ABNORMAL HIGH (ref 9.1–12.0)

## 2014-09-28 ENCOUNTER — Other Ambulatory Visit: Payer: Self-pay

## 2014-09-28 DIAGNOSIS — I4891 Unspecified atrial fibrillation: Secondary | ICD-10-CM

## 2014-09-28 DIAGNOSIS — Z7901 Long term (current) use of anticoagulants: Secondary | ICD-10-CM

## 2014-10-01 ENCOUNTER — Other Ambulatory Visit: Payer: Self-pay | Admitting: *Deleted

## 2014-10-01 ENCOUNTER — Ambulatory Visit (INDEPENDENT_AMBULATORY_CARE_PROVIDER_SITE_OTHER): Payer: Medicare Other | Admitting: *Deleted

## 2014-10-01 DIAGNOSIS — Z23 Encounter for immunization: Secondary | ICD-10-CM

## 2014-10-02 ENCOUNTER — Other Ambulatory Visit: Payer: Self-pay | Admitting: *Deleted

## 2014-10-02 DIAGNOSIS — Z23 Encounter for immunization: Secondary | ICD-10-CM

## 2014-10-16 ENCOUNTER — Other Ambulatory Visit: Payer: Self-pay | Admitting: Internal Medicine

## 2014-10-20 DIAGNOSIS — M25561 Pain in right knee: Secondary | ICD-10-CM | POA: Diagnosis not present

## 2014-10-20 DIAGNOSIS — M25562 Pain in left knee: Secondary | ICD-10-CM | POA: Diagnosis not present

## 2014-10-23 ENCOUNTER — Other Ambulatory Visit: Payer: Medicare Other

## 2014-10-23 DIAGNOSIS — I4891 Unspecified atrial fibrillation: Secondary | ICD-10-CM | POA: Diagnosis not present

## 2014-10-23 DIAGNOSIS — Z7901 Long term (current) use of anticoagulants: Secondary | ICD-10-CM

## 2014-10-24 LAB — PROTIME-INR
INR: 2.4 — ABNORMAL HIGH (ref 0.8–1.2)
Prothrombin Time: 24.1 s — ABNORMAL HIGH (ref 9.1–12.0)

## 2014-10-26 ENCOUNTER — Other Ambulatory Visit: Payer: Medicare Other

## 2014-11-20 ENCOUNTER — Other Ambulatory Visit: Payer: Self-pay | Admitting: Internal Medicine

## 2014-11-27 ENCOUNTER — Other Ambulatory Visit: Payer: Medicare Other

## 2014-11-27 DIAGNOSIS — Z7901 Long term (current) use of anticoagulants: Secondary | ICD-10-CM | POA: Diagnosis not present

## 2014-11-27 DIAGNOSIS — I4891 Unspecified atrial fibrillation: Secondary | ICD-10-CM | POA: Diagnosis not present

## 2014-11-28 LAB — PROTIME-INR
INR: 1.6 — ABNORMAL HIGH (ref 0.8–1.2)
Prothrombin Time: 16.7 s — ABNORMAL HIGH (ref 9.1–12.0)

## 2014-12-28 ENCOUNTER — Other Ambulatory Visit: Payer: Medicare Other

## 2014-12-28 DIAGNOSIS — E785 Hyperlipidemia, unspecified: Secondary | ICD-10-CM | POA: Diagnosis not present

## 2014-12-28 DIAGNOSIS — I5022 Chronic systolic (congestive) heart failure: Secondary | ICD-10-CM | POA: Diagnosis not present

## 2014-12-28 DIAGNOSIS — I4891 Unspecified atrial fibrillation: Secondary | ICD-10-CM | POA: Diagnosis not present

## 2014-12-28 DIAGNOSIS — R739 Hyperglycemia, unspecified: Secondary | ICD-10-CM | POA: Diagnosis not present

## 2014-12-28 DIAGNOSIS — I10 Essential (primary) hypertension: Secondary | ICD-10-CM

## 2014-12-28 DIAGNOSIS — Z7901 Long term (current) use of anticoagulants: Secondary | ICD-10-CM

## 2014-12-29 LAB — CBC WITH DIFFERENTIAL/PLATELET
Basophils Absolute: 0 10*3/uL (ref 0.0–0.2)
Basos: 1 %
EOS (ABSOLUTE): 0.1 10*3/uL (ref 0.0–0.4)
Eos: 2 %
Hematocrit: 41 % (ref 37.5–51.0)
Hemoglobin: 14 g/dL (ref 12.6–17.7)
Immature Grans (Abs): 0.1 10*3/uL (ref 0.0–0.1)
Immature Granulocytes: 1 %
Lymphocytes Absolute: 1.2 10*3/uL (ref 0.7–3.1)
Lymphs: 19 %
MCH: 31.5 pg (ref 26.6–33.0)
MCHC: 34.1 g/dL (ref 31.5–35.7)
MCV: 92 fL (ref 79–97)
Monocytes Absolute: 0.7 10*3/uL (ref 0.1–0.9)
Monocytes: 10 %
Neutrophils Absolute: 4.2 10*3/uL (ref 1.4–7.0)
Neutrophils: 67 %
Platelets: 249 10*3/uL (ref 150–379)
RBC: 4.44 x10E6/uL (ref 4.14–5.80)
RDW: 14 % (ref 12.3–15.4)
WBC: 6.3 10*3/uL (ref 3.4–10.8)

## 2014-12-29 LAB — COMPREHENSIVE METABOLIC PANEL
ALT: 18 IU/L (ref 0–44)
AST: 29 IU/L (ref 0–40)
Albumin/Globulin Ratio: 2 (ref 1.1–2.5)
Albumin: 4.5 g/dL (ref 3.2–4.6)
Alkaline Phosphatase: 54 IU/L (ref 39–117)
BUN/Creatinine Ratio: 16 (ref 10–22)
BUN: 14 mg/dL (ref 10–36)
Bilirubin Total: 0.8 mg/dL (ref 0.0–1.2)
CO2: 21 mmol/L (ref 18–29)
Calcium: 9.5 mg/dL (ref 8.6–10.2)
Chloride: 94 mmol/L — ABNORMAL LOW (ref 96–106)
Creatinine, Ser: 0.85 mg/dL (ref 0.76–1.27)
GFR calc Af Amer: 87 mL/min/{1.73_m2} (ref >59)
GFR calc non Af Amer: 76 mL/min/{1.73_m2} (ref >59)
Globulin, Total: 2.3 g/dL (ref 1.5–4.5)
Glucose: 89 mg/dL (ref 65–99)
Potassium: 4.3 mmol/L (ref 3.5–5.2)
Sodium: 134 mmol/L (ref 134–144)
Total Protein: 6.8 g/dL (ref 6.0–8.5)

## 2014-12-29 LAB — LIPID PANEL
Chol/HDL Ratio: 2.7 ratio units (ref 0.0–5.0)
Cholesterol, Total: 156 mg/dL (ref 100–199)
HDL: 57 mg/dL (ref >39)
LDL Calculated: 82 mg/dL (ref 0–99)
Triglycerides: 87 mg/dL (ref 0–149)
VLDL Cholesterol Cal: 17 mg/dL (ref 5–40)

## 2014-12-29 LAB — PROTIME-INR
INR: 1.9 — ABNORMAL HIGH (ref 0.8–1.2)
Prothrombin Time: 19.1 s — ABNORMAL HIGH (ref 9.1–12.0)

## 2014-12-29 LAB — HEMOGLOBIN A1C
Est. average glucose Bld gHb Est-mCnc: 126 mg/dL
Hgb A1c MFr Bld: 6 % — ABNORMAL HIGH (ref 4.8–5.6)

## 2015-01-07 ENCOUNTER — Encounter: Payer: Self-pay | Admitting: Internal Medicine

## 2015-01-07 ENCOUNTER — Ambulatory Visit (INDEPENDENT_AMBULATORY_CARE_PROVIDER_SITE_OTHER): Payer: Medicare Other | Admitting: Internal Medicine

## 2015-01-07 VITALS — BP 130/90 | HR 69 | Temp 97.4°F | Resp 20 | Ht 72.0 in | Wt 198.0 lb

## 2015-01-07 DIAGNOSIS — F0150 Vascular dementia without behavioral disturbance: Secondary | ICD-10-CM

## 2015-01-07 DIAGNOSIS — Z0000 Encounter for general adult medical examination without abnormal findings: Secondary | ICD-10-CM | POA: Diagnosis not present

## 2015-01-07 DIAGNOSIS — I5022 Chronic systolic (congestive) heart failure: Secondary | ICD-10-CM

## 2015-01-07 DIAGNOSIS — H6123 Impacted cerumen, bilateral: Secondary | ICD-10-CM

## 2015-01-07 NOTE — Progress Notes (Signed)
Patient ID: Troy Freeman, male   DOB: 10/13/1920, 81 y.o.   MRN: [id number]   Location: [place]  Provider: Rexene Edison. Mariea Clonts, D.O., C.M.D.  Code Status: DNR Goals of Care: Advanced Directive information Does patient have an advance directive?: Yes  Chief Complaint  Patient presents with  . Annual Exam    Annual Exam, Labs printed  . OTHER    Wife in room with patient    HPI: Patient is a 81 y.o. male seen in the office today for an annual wellness exam.    Depression screen [facility] 2/9 01/07/2015 12/26/2013 07/11/2012 04/11/2012  Decreased Interest 0 0 0 0  Down, Depressed, Hopeless 0 0 0 0  PHQ - 2 Score 0 0 0 0    Fall Risk  01/07/2015 07/02/2014 12/26/2013 07/11/2012 04/11/2012  Falls in the past year? No No Yes No No  Number falls in past yr: - - 1 - -  Injury with Fall? - - No - -   MMSE - Mini Mental State Exam 01/07/2015  Not completed: (No Data)  Orientation to time 1  Orientation to Place 2  Registration 3  Attention/ Calculation 1  Recall 1  Language- name 2 objects 2  Language- repeat 1  Language- follow 3 step command 3  Language- read & follow direction 1  Write a sentence 1  Copy design 0  Total score 16  failed clock drawing.    Health Maintenance  Topic Date Due  . TETANUS/TDAP  03/23/1941  . INFLUENZA VACCINE  08/10/2015  . PNA vac Low Risk Adult (2 of 2 - PPSV23) 10/02/2015  . ZOSTAVAX  Completed   Urinary incontinence?  A little leakage at times.  Does have to get up 3-4 x.   Functional status?  Needs assistance with bathing, dressing, grooming, help to restroom to toilet, ambulates with walker (pushes it far ahead like a lawnmower) Exercise? Minimal walking in home Diet? Regular, 1/2 biscuit as a treat once a week Vision: wears glasses Hearing: very HOH, has hearing aides Dentition:  Had two front teeth drilled in.  Was meant to go the week that Edroy broke her arm.  Endodontist said nothing needed to be done to the tooth.    Sees  Dr. Martinique in Jan.    Review of Systems:  Review of Systems  Constitutional: Negative for fever and chills.  HENT: Positive for hearing loss. Negative for congestion.   Eyes: Negative for blurred vision.  Respiratory: Negative for shortness of breath.   Cardiovascular: Negative for chest pain and leg swelling.  Genitourinary: Positive for urgency and frequency.  Musculoskeletal: Negative for falls.  Skin: Negative for rash.  Neurological: Positive for dizziness.       With rapid positional change  Psychiatric/Behavioral: Positive for memory loss. Negative for depression.    Past Medical History  Diagnosis Date  . Bradycardia   . IHD (ischemic heart disease) July 2002    Large anterior MI with PCI to LAD with subsequent CABG x 6 in 2002  . LV dysfunction     EF is 30 to 35%; does not tolerate ACE due to cough and dizziness  . MI, old     OLD ANTERIOR MI  . Atrial fibrillation ([place])     managed on coumadin and with rate control  . S/P CABG (coronary artery bypass graft) 2002  . Fall Aug 2012    with fractured right hip  . BPH (benign prostatic hyperplasia)   .  GERD (gastroesophageal reflux disease)   . Dementia     mild  . Chronic anticoagulation   . Hyperlipidemia   . Hypertension   . Arthritis     Osteoarthritis  . Syncope 09/11/2008  . Osteoarthrosis, unspecified whether generalized or localized, unspecified site   . Long term (current) use of anticoagulants   . Congestive heart failure, unspecified   . Senile osteoporosis   . Other B-complex deficiencies   . Unspecified vitamin D deficiency   . Abnormality of gait   . Anemia, unspecified     Past Surgical History  Procedure Laterality Date  . Coronary angioplasty  07/2000    LAD  . Coronary artery bypass graft  08/2000    LIMA to LAD, SVG to RCA and PD, SVG to OM1 and OM2, and SVG to DX  . Right hip surgery Right 1996 & 2012    Replacement  . Left hip surgery Left 04/18/2005    Total replacement  . Knee  arthroscopy Right     Arthroscopic surgery  . Eye surgery  2001    Cataract surgery  . Eye surgery  2006    Cataract surgery  . Esophagogastroduodenoscopy endoscopy    . Colon surgery  2004    Colonoscopy  . Colon surgery  10/29/2003    Colonoscopy    Allergies  Allergen Reactions  . Aricept [Donepezil]     lethargy  . Codeine Nausea And Vomiting  . Coreg [Carvedilol] Other (See Comments)    Bradycardia   . Penicillins Rash  . Tramadol Nausea Only      Medication List       This list is accurate as of: 01/07/15  2:54 PM.  Always use your most recent med list.               CALCIUM + D PO  Take 600 mg by mouth daily. 3 TABS DAILY     COUMADIN 1 MG tablet  Generic drug:  warfarin  TAKE 4 TABLETS ON FRIDAY AND MONDAY, AND 3 TABLETS ON TUES, WED, THURS, SATURDAY, AND SUNDAY.     furosemide 20 MG tablet  Commonly known as:  LASIX  TAKE 1 TABLET ONCE DAILY. Only takes if weight gain 4 lbs in 24 hours     Magnesium Oxide 400 (240 Mg) MG Tabs  TAKE 1 TABLET DAILY.     nitroGLYCERIN 0.4 MG SL tablet  Commonly known as:  NITROSTAT  Place 1 tablet (0.4 mg total) under the tongue every 5 (five) minutes as needed for chest pain.     omeprazole 20 MG capsule  Commonly known as:  PRILOSEC  TAKE 1 CAPSULE DAILY FOR STOMACH ACID     potassium chloride 10 MEQ tablet  Commonly known as:  K-DUR  TAKE 1 TABLET TWICE DAILY. Only when taking lasix     simvastatin 40 MG tablet  Commonly known as:  ZOCOR  TAKE 1 TABLET ONCE DAILY FOR CHOLESTEROL.     TYLENOL 8 HOUR PO  Take 1,000 mg by mouth 2 (two) times daily.        Physical Exam: Filed Vitals:   01/07/15 1407  BP: 130/90  Pulse: 69  Temp: 97.4 F (36.3 C)  TempSrc: Oral  Resp: 20  Height: 6' (1.829 m)  Weight: 198 lb (89.812 kg)  SpO2: 96%   Body mass index is 26.85 kg/(m^2). Physical Exam  Labs reviewed: Basic Metabolic Panel:  Recent Labs  06/29/14 0923 12/28/14 1057  NA  136 134  K 4.2 4.3    CL 97 94*  CO2 22 21  GLUCOSE 89 89  BUN 12 14  CREATININE 0.90 0.85  CALCIUM 9.5 9.5  MG 1.6  --    Liver Function Tests:  Recent Labs  06/29/14 0923 12/28/14 1057  AST 25 29  ALT 15 18  ALKPHOS 50 54  BILITOT 0.9 0.8  PROT 7.0 6.8  ALBUMIN 4.8* 4.5   No results for input(s): LIPASE, AMYLASE in the last 8760 hours. No results for input(s): AMMONIA in the last 8760 hours. CBC:  Recent Labs  06/29/14 0923 12/28/14 1057  WBC 6.2 6.3  NEUTROABS 4.1 4.2  HCT 42.9 41.0   Lipid Panel:  Recent Labs  06/29/14 0923 12/28/14 1057  CHOL 149 156  HDL 52 57  LDLCALC 72 82  TRIG 125 87  CHOLHDL 2.9 2.7   Lab Results  Component Value Date   HGBA1C 6.0* 12/28/2014    Procedures: EKG:  Afib, T abnormality in anteroseptal leads (prior septal infarct noted 9/15 also), HR 58  Assessment/Plan 1. Medicare annual wellness visit, subsequent -is up to date on necessary preventive care considering his advanced dementia, advanced age and functional status -annual wellness items updated -needs pneumovax in august of next year--suspect he did have this since 24 in [place] but it was not transferred over  2. Vascular dementia, without behavioral disturbance -is progressing--mmse now 16/30 with failed clock  3. Systolic CHF, chronic (HCC) - weight is stable -cont current treatments as per cardiology including lasix prn and potassium - EKG 12-Lead  4. Cerumen impaction, bilateral - Ear wax removal--flushing only performed by CMA, but I went back in to use loop to mechanically debride the wax from his auditory canal due to bilateral moist cerumen impaction with worsened severe hearing loss  Labs/tests ordered:  Just had labs were were wnl, keep f/u cardiology Next appt:  4 mos for med mgt  Endiya Klahr L. Leaha Cuervo, D.O. [facility] [address], [place] [address] Cell Phone (Mon-Fri 8am-5pm):  [phone] On Call:  [phone]  & follow prompts after 5pm & weekends Office Phone:  [phone] Office Fax:  [phone]

## 2015-01-14 ENCOUNTER — Other Ambulatory Visit: Payer: Self-pay | Admitting: Internal Medicine

## 2015-01-26 ENCOUNTER — Encounter: Payer: Self-pay | Admitting: Cardiology

## 2015-01-26 ENCOUNTER — Ambulatory Visit (INDEPENDENT_AMBULATORY_CARE_PROVIDER_SITE_OTHER): Payer: Medicare Other | Admitting: Cardiology

## 2015-01-26 VITALS — BP 138/80 | HR 72 | Ht 72.0 in | Wt 193.0 lb

## 2015-01-26 DIAGNOSIS — I482 Chronic atrial fibrillation, unspecified: Secondary | ICD-10-CM

## 2015-01-26 DIAGNOSIS — I259 Chronic ischemic heart disease, unspecified: Secondary | ICD-10-CM

## 2015-01-26 DIAGNOSIS — I5022 Chronic systolic (congestive) heart failure: Secondary | ICD-10-CM

## 2015-01-26 DIAGNOSIS — I480 Paroxysmal atrial fibrillation: Secondary | ICD-10-CM | POA: Diagnosis not present

## 2015-01-26 NOTE — Progress Notes (Signed)
Troy Freeman Date of Birth: [birth year] Medical Record [id number]  History of Present Illness: [name] is seen for a followup visit. He is 89. He is seen with his wife. He has known CAD with LV dysfunction. He has been managed conservatively. His other problems includes chronic atrial fib, coumadin therapy and progressive dementia. His wife provides around the clock care for him. He is  hard of hearing.  He has a history of severe bradycardia on beta blockers and hypotension on ACEi/ARB therapy. He has remote history of anterior MI and prior CABG.  On follow up today he is doing OK.  No new issues. Still limited in ability to ambulate. Walks short distances with walker.  No complaints of dyspnea or chest pain.  He has chronic edema and his wife gives him lasix prn if weight gain of 4 lbs.   Current Outpatient Prescriptions on File Prior to Visit  Medication Sig Dispense Refill  . Acetaminophen (TYLENOL 8 HOUR PO) Take 1,000 mg by mouth 2 (two) times daily.     . Calcium Carbonate-Vitamin D (CALCIUM + D PO) Take 600 mg by mouth daily. 3 TABS DAILY     . COUMADIN 1 MG tablet TAKE 4 TABLETS ON FRIDAY AND MONDAY, AND 3 TABLETS ON TUES, WED, THURS, SATURDAY, AND SUNDAY. 99 tablet 0  . furosemide (LASIX) 20 MG tablet TAKE 1 TABLET ONCE DAILY. Only takes if weight gain 4 lbs in 24 hours 30 tablet 2  . Magnesium Oxide 400 (240 MG) MG TABS TAKE 1 TABLET DAILY. 90 tablet 3  . nitroGLYCERIN (NITROSTAT) 0.4 MG SL tablet Place 1 tablet (0.4 mg total) under the tongue every 5 (five) minutes as needed for chest pain. 25 tablet 3  . omeprazole (PRILOSEC) 20 MG capsule TAKE 1 CAPSULE DAILY FOR STOMACH ACID 30 capsule 5  . potassium chloride (K-DUR) 10 MEQ tablet TAKE 1 TABLET TWICE DAILY. Only when taking lasix 60 tablet 2  . simvastatin (ZOCOR) 40 MG tablet TAKE 1 TABLET ONCE DAILY FOR CHOLESTEROL. 90 tablet 0  . [DISCONTINUED] magnesium oxide (MAG-OX) 400 MG tablet Take 1 tablet (400 mg total) by  mouth daily. 90 tablet 1   No current facility-administered medications on file prior to visit.    Allergies  Allergen Reactions  . Aricept [Donepezil]     lethargy  . Codeine Nausea And Vomiting  . Coreg [Carvedilol] Other (See Comments)    Bradycardia   . Penicillins Rash  . Tramadol Nausea Only    Past Medical History  Diagnosis Date  . Bradycardia   . IHD (ischemic heart disease) July 2002    Large anterior MI with PCI to LAD with subsequent CABG x 6 in 2002  . LV dysfunction     EF is 30 to 35%; does not tolerate ACE due to cough and dizziness  . MI, old     OLD ANTERIOR MI  . Atrial fibrillation ([place])     managed on coumadin and with rate control  . S/P CABG (coronary artery bypass graft) 2002  . Fall Aug 2012    with fractured right hip  . BPH (benign prostatic hyperplasia)   . GERD (gastroesophageal reflux disease)   . Dementia     mild  . Chronic anticoagulation   . Hyperlipidemia   . Hypertension   . Arthritis     Osteoarthritis  . Syncope 09/11/2008  . Osteoarthrosis, unspecified whether generalized or localized, unspecified site   . Long term (current)  use of anticoagulants   . Congestive heart failure, unspecified   . Senile osteoporosis   . Other B-complex deficiencies   . Unspecified vitamin D deficiency   . Abnormality of gait   . Anemia, unspecified     Past Surgical History  Procedure Laterality Date  . Coronary angioplasty  07/2000    LAD  . Coronary artery bypass graft  08/2000    LIMA to LAD, SVG to RCA and PD, SVG to OM1 and OM2, and SVG to DX  . Right hip surgery Right 1996 & 2012    Replacement  . Left hip surgery Left 04/18/2005    Total replacement  . Knee arthroscopy Right     Arthroscopic surgery  . Eye surgery  2001    Cataract surgery  . Eye surgery  2006    Cataract surgery  . Esophagogastroduodenoscopy endoscopy    . Colon surgery  2004    Colonoscopy  . Colon surgery  10/29/2003    Colonoscopy    History    Smoking status  . Former Smoker -- 1.00 packs/day for 30 years  . Types: Cigarettes  . Quit date: 01/09/1958  Smokeless tobacco  . Never Used    History  Alcohol Use No    Family History  Problem Relation Age of Onset  . Cancer Mother   . Stroke Mother   . Heart disease Father   . Stroke Father   . Cancer Sister   . Cancer Brother   . Early death Brother     Review of Systems: The review of systems is per the HPI.  All other systems were reviewed and are negative.  Physical Exam: BP 138/80 mmHg  Pulse 72  Ht 6' (1.829 m)  Wt 87.544 kg (193 lb)  BMI 26.17 kg/m2 Patient is very pleasant and in no acute distress. He is quite hard of hearing. Skin is warm and dry. Color is normal.  HEENT is unremarkable. Normocephalic/atraumatic. PERRL. Sclera are nonicteric. Neck is supple. No masses. No JVD. Lungs are clear. Cardiac exam shows an irregular rhythm. His rate is controlled.  Old sternotomy scar. Normal S1-2 without murmur. Abdomen is soft. Extremities reveal tr edema. Gait and ROM are intact. He is using a walker. No gross neurologic deficits noted.  LABORATORY DATA:    Lab Results  Component Value Date   WBC 6.3 12/28/2014   HGB 13.9 12/23/2013   HCT 41.0 12/28/2014   PLT 249 12/28/2014   GLUCOSE 89 12/28/2014   CHOL 156 12/28/2014   TRIG 87 12/28/2014   HDL 57 12/28/2014   LDLCALC 82 12/28/2014   ALT 18 12/28/2014   AST 29 12/28/2014   NA 134 12/28/2014   K 4.3 12/28/2014   CL 94* 12/28/2014   CREATININE 0.85 12/28/2014   BUN 14 12/28/2014   CO2 21 12/28/2014   TSH 3.488 08/21/2010   INR 1.9* 12/28/2014   HGBA1C 6.0* 12/28/2014    Ecg done 01/07/15 shows atrial fibrillation with rate 58. Old anterior infarct. RBBB resolved. I have personally reviewed and interpreted this study.  Assessment / Plan: 1. Bradycardia -resolved since beta blocker discontinued.   2. CAD -old anterior MI. s/p CABG- no chest pain. Conservative therapy.  3. Atrial fib - rate is  controlled. He is on chronic anticoagulation with coumadin. INR therapeutic.  4. Ischemic cardiomyopathy EF 35%. Asymptomatic. Intolerant of meds as noted above. Continue to monitor. Use lasix prn swelling  I will follow up in 6 months.

## 2015-01-26 NOTE — Patient Instructions (Signed)
Continue your current therapy  I will see you in 6 months.   

## 2015-01-29 ENCOUNTER — Other Ambulatory Visit: Payer: Medicare Other

## 2015-01-29 DIAGNOSIS — Z7901 Long term (current) use of anticoagulants: Secondary | ICD-10-CM | POA: Diagnosis not present

## 2015-01-29 DIAGNOSIS — I4891 Unspecified atrial fibrillation: Secondary | ICD-10-CM | POA: Diagnosis not present

## 2015-01-30 LAB — PROTIME-INR
INR: 1.8 — ABNORMAL HIGH (ref 0.8–1.2)
Prothrombin Time: 18.9 s — ABNORMAL HIGH (ref 9.1–12.0)

## 2015-01-31 ENCOUNTER — Other Ambulatory Visit: Payer: Self-pay | Admitting: Internal Medicine

## 2015-02-12 ENCOUNTER — Other Ambulatory Visit: Payer: Medicare Other

## 2015-02-12 ENCOUNTER — Other Ambulatory Visit: Payer: Self-pay

## 2015-02-12 DIAGNOSIS — I4891 Unspecified atrial fibrillation: Secondary | ICD-10-CM | POA: Diagnosis not present

## 2015-02-12 MED ORDER — WARFARIN SODIUM 1 MG PO TABS: ORAL_TABLET | ORAL | Status: DC

## 2015-02-13 LAB — PROTIME-INR
INR: 2.6 — ABNORMAL HIGH (ref 0.8–1.2)
Prothrombin Time: 26.4 s — ABNORMAL HIGH (ref 9.1–12.0)

## 2015-02-15 ENCOUNTER — Other Ambulatory Visit: Payer: Self-pay

## 2015-02-15 DIAGNOSIS — I480 Paroxysmal atrial fibrillation: Secondary | ICD-10-CM

## 2015-02-15 DIAGNOSIS — Z7901 Long term (current) use of anticoagulants: Secondary | ICD-10-CM

## 2015-02-16 ENCOUNTER — Ambulatory Visit (INDEPENDENT_AMBULATORY_CARE_PROVIDER_SITE_OTHER): Payer: Medicare Other | Admitting: Internal Medicine

## 2015-02-16 ENCOUNTER — Encounter: Payer: Self-pay | Admitting: Internal Medicine

## 2015-02-16 VITALS — BP 120/80 | HR 86 | Temp 98.0°F | Resp 20 | Ht 72.0 in | Wt 198.0 lb

## 2015-02-16 DIAGNOSIS — J069 Acute upper respiratory infection, unspecified: Secondary | ICD-10-CM | POA: Insufficient documentation

## 2015-02-16 MED ORDER — DM-GUAIFENESIN ER 30-600 MG PO TB12: ORAL_TABLET | ORAL | Status: DC

## 2015-02-16 NOTE — Progress Notes (Signed)
Patient ID: Troy Freeman, male   DOB: [birth year], 82 y.o.   MRN: [id number]    Facility  [facility]    Place of Service:   OFFICE    Allergies  Allergen Reactions  . Aricept [Donepezil]     lethargy  . Codeine Nausea And Vomiting  . Coreg [Carvedilol] Other (See Comments)    Bradycardia   . Penicillins Rash  . Tramadol Nausea Only    Chief Complaint  Patient presents with  . Acute Visit    cold symtoms x 5 days, night is worse    HPI:  Patient has 5 days of cough and chest congestion. No fever. Had a scratchy throat at the beginning, but it is not scratching any longer. No sputum.  Medications: Patient's Medications  New Prescriptions   No medications on file  Previous Medications   ACETAMINOPHEN (TYLENOL 8 HOUR PO)    Take 1,000 mg by mouth 2 (two) times daily.    CALCIUM CARBONATE-VITAMIN D (CALCIUM + D PO)    Take 600 mg by mouth daily. 3 TABS DAILY    FUROSEMIDE (LASIX) 20 MG TABLET    TAKE 1 TABLET ONCE DAILY. Only takes if weight gain 4 lbs in 24 hours   MAGNESIUM OXIDE 400 (240 MG) MG TABS    TAKE 1 TABLET DAILY.   NITROGLYCERIN (NITROSTAT) 0.4 MG SL TABLET    Place 1 tablet (0.4 mg total) under the tongue every 5 (five) minutes as needed for chest pain.   OMEPRAZOLE (PRILOSEC) 20 MG CAPSULE    TAKE 1 CAPSULE DAILY FOR STOMACH ACID   POTASSIUM CHLORIDE (K-DUR) 10 MEQ TABLET    TAKE 1 TABLET TWICE DAILY. Only when taking lasix   SIMVASTATIN (ZOCOR) 40 MG TABLET    TAKE 1 TABLET ONCE DAILY FOR CHOLESTEROL.   WARFARIN (COUMADIN) 1 MG TABLET    TAKE 4 TABLETS ON FRIDAY AND MONDAY, AND 3 TABLETS ON TUES, WED, THURS, SATURDAY, AND SUNDAY.  Modified Medications   No medications on file  Discontinued Medications   MAGNESIUM OXIDE 400 (240 MG) MG TABS    TAKE 1 TABLET DAILY.    Review of Systems  Constitutional: Negative for fever and chills.  HENT: Positive for hearing loss. Negative for congestion.   Respiratory: Positive for cough. Negative for shortness of  breath.   Cardiovascular: Negative for chest pain and leg swelling.  Genitourinary: Positive for urgency and frequency.  Skin: Negative for rash.  Neurological: Positive for dizziness.       With rapid positional change    Filed Vitals:   02/16/15 1547  BP: 120/80  Pulse: 86  Temp: 98 F (36.7 C)  TempSrc: Oral  Resp: 20  Height: 6' (1.829 m)  Weight: 198 lb (89.812 kg)  SpO2: 95%   Body mass index is 26.85 kg/(m^2). Filed Weights   02/16/15 1547  Weight: 198 lb (89.812 kg)     Physical Exam  Constitutional: He appears well-developed and well-nourished. No distress.  HENT:  Deaf right ear and using aid in left ear  Cardiovascular: Intact distal pulses.   irreg irreg  Pulmonary/Chest: Effort normal. He has rales.  Abdominal: Soft. Bowel sounds are normal.  Musculoskeletal: Normal range of motion.  Walks with stooped posture pushing his walker as if he is mowing the grass  Neurological: He is alert.  Poor short term memory--repeats himself frequently  Skin: Skin is warm and dry.  Psychiatric:  Agitated and ready to leave today  Labs reviewed: Lab Summary Latest Ref Rng 12/28/2014 06/29/2014  Hemoglobin 12.6 - 17.7 g/dL 14.0 14.6  Hematocrit 37.5 - 51.0 % 41.0 42.9  White count 3.4 - 10.8 x10E3/uL 6.3 6.2  Platelet count 150 - 379 x10E3/uL 249 (None)  Sodium 134 - 144 mmol/L 134 136  Potassium 3.5 - 5.2 mmol/L 4.3 4.2  Calcium 8.6 - 10.2 mg/dL 9.5 9.5  Phosphorus - (None) (None)  Creatinine 0.76 - 1.27 mg/dL 0.85 0.90  AST 0 - 40 IU/L 29 25  Alk Phos 39 - 117 IU/L 54 50  Bilirubin 0.0 - 1.2 mg/dL 0.8 0.9  Glucose 65 - 99 mg/dL 89 89  Cholesterol - (None) (None)  HDL cholesterol >39 mg/dL 57 52  Triglycerides 0 - 149 mg/dL 87 125  LDL Direct - (None) (None)  LDL Calc 0 - 99 mg/dL 82 72  Total protein - (None) (None)  Albumin 3.2 - 4.6 g/dL 4.5 4.8(H)   Lab Results  Component Value Date   TSH 3.488 08/21/2010   Lab Results  Component Value Date    BUN 14 12/28/2014   BUN 12 06/29/2014   BUN 8* 12/23/2013   Lab Results  Component Value Date   HGBA1C 6.0* 12/28/2014   HGBA1C 6.0* 06/29/2014    Assessment/Troy Freeman  1. Acute upper respiratory infection - dextromethorphan-guaiFENesin (MUCINEX DM) 30-600 MG 12hr tablet; Take one twice daily to help congestion and cough.  Dispense: 24 tablet; Refill: 2

## 2015-03-10 ENCOUNTER — Other Ambulatory Visit: Payer: Self-pay | Admitting: *Deleted

## 2015-03-10 DIAGNOSIS — I480 Paroxysmal atrial fibrillation: Secondary | ICD-10-CM

## 2015-03-12 ENCOUNTER — Other Ambulatory Visit: Payer: Medicare Other

## 2015-03-12 DIAGNOSIS — Z7901 Long term (current) use of anticoagulants: Secondary | ICD-10-CM | POA: Diagnosis not present

## 2015-03-12 DIAGNOSIS — I480 Paroxysmal atrial fibrillation: Secondary | ICD-10-CM | POA: Diagnosis not present

## 2015-03-13 LAB — PROTIME-INR
INR: 2.1 — ABNORMAL HIGH (ref 0.8–1.2)
Prothrombin Time: 21.2 s — ABNORMAL HIGH (ref 9.1–12.0)

## 2015-03-17 ENCOUNTER — Other Ambulatory Visit: Payer: Self-pay | Admitting: Internal Medicine

## 2015-04-08 ENCOUNTER — Telehealth: Payer: Self-pay | Admitting: Cardiology

## 2015-04-08 NOTE — Telephone Encounter (Signed)
No answer when dialed. 

## 2015-04-08 NOTE — Telephone Encounter (Signed)
NEw Message  Pt requested to speak w/ RN concerning an appt for July- pt wife was told the July schedule is not available and requested to speak w/ RN. Please call back and discuss.

## 2015-04-09 NOTE — Telephone Encounter (Signed)
Returned call, patient scheduled for 6 mo f/u on 07/09/15 & confirmed w/ caller.

## 2015-04-14 ENCOUNTER — Other Ambulatory Visit: Payer: Medicare Other

## 2015-04-14 ENCOUNTER — Other Ambulatory Visit: Payer: Self-pay | Admitting: Internal Medicine

## 2015-04-14 DIAGNOSIS — Z7901 Long term (current) use of anticoagulants: Secondary | ICD-10-CM

## 2015-04-14 DIAGNOSIS — I4891 Unspecified atrial fibrillation: Secondary | ICD-10-CM | POA: Diagnosis not present

## 2015-04-14 NOTE — Telephone Encounter (Signed)
Patient wife requested.  

## 2015-04-15 LAB — PROTIME-INR
INR: 2 — ABNORMAL HIGH (ref 0.8–1.2)
Prothrombin Time: 20 s — ABNORMAL HIGH (ref 9.1–12.0)

## 2015-04-16 ENCOUNTER — Other Ambulatory Visit: Payer: Self-pay | Admitting: Internal Medicine

## 2015-05-10 ENCOUNTER — Ambulatory Visit (INDEPENDENT_AMBULATORY_CARE_PROVIDER_SITE_OTHER): Payer: Medicare Other | Admitting: Internal Medicine

## 2015-05-10 ENCOUNTER — Encounter: Payer: Self-pay | Admitting: Internal Medicine

## 2015-05-10 VITALS — BP 142/78 | HR 65 | Temp 97.5°F | Ht 72.0 in | Wt 198.0 lb

## 2015-05-10 DIAGNOSIS — Z5181 Encounter for therapeutic drug level monitoring: Secondary | ICD-10-CM

## 2015-05-10 DIAGNOSIS — Z7901 Long term (current) use of anticoagulants: Secondary | ICD-10-CM

## 2015-05-10 DIAGNOSIS — I480 Paroxysmal atrial fibrillation: Secondary | ICD-10-CM | POA: Diagnosis not present

## 2015-05-10 DIAGNOSIS — F0150 Vascular dementia without behavioral disturbance: Secondary | ICD-10-CM

## 2015-05-10 DIAGNOSIS — E785 Hyperlipidemia, unspecified: Secondary | ICD-10-CM

## 2015-05-10 DIAGNOSIS — M170 Bilateral primary osteoarthritis of knee: Secondary | ICD-10-CM | POA: Diagnosis not present

## 2015-05-10 DIAGNOSIS — R739 Hyperglycemia, unspecified: Secondary | ICD-10-CM

## 2015-05-10 DIAGNOSIS — I5022 Chronic systolic (congestive) heart failure: Secondary | ICD-10-CM | POA: Diagnosis not present

## 2015-05-10 DIAGNOSIS — I10 Essential (primary) hypertension: Secondary | ICD-10-CM | POA: Diagnosis not present

## 2015-05-10 NOTE — Progress Notes (Signed)
Patient ID: Troy Freeman, male   DOB: 09-30-1920, 82 y.o.   MRN: [id number]   Location:  [facility] clinic Provider:  Farooq Petrovich L. Mariea Clonts, D.O., C.M.D.  Code Status: DNR Goals of Care:  Advanced Directives 05/10/2015  Does patient have an advance directive? Yes  Type of Advance Directive -  Does patient want to make changes to advanced directive? -  Copy of advanced directive(s) in chart? Yes  Pre-existing out of facility DNR order (yellow form or pink MOST form) Pink MOST form placed in chart (order not valid for inpatient use)     Chief Complaint  Patient presents with  . Medical Management of Chronic Issues    4 mth follow-up    HPI: Patient is a 81 y.o. male seen today for medical management of chronic diseases.    BP slightly up--gets aggravated waiting to be seen.  Sees dermatology next month.    Dixie reports the pneumovax was 2003 (and clearly it wasn't prevnar as it was showing b/c it didn't exist then).  Afib:  Stable, request INR check today instead of two days from now so will do that.  No symptoms.    CHF:  After eating a reuben with family, his weight went up 2 lbs in one day.  Edema stable.  Normally follows low sodium diet  Past Medical History  Diagnosis Date  . Bradycardia   . IHD (ischemic heart disease) July 2002    Large anterior MI with PCI to LAD with subsequent CABG x 6 in 2002  . LV dysfunction     EF is 30 to 35%; does not tolerate ACE due to cough and dizziness  . MI, old     OLD ANTERIOR MI  . Atrial fibrillation ([place])     managed on coumadin and with rate control  . S/P CABG (coronary artery bypass graft) 2002  . Fall Aug 2012    with fractured right hip  . BPH (benign prostatic hyperplasia)   . GERD (gastroesophageal reflux disease)   . Dementia     mild  . Chronic anticoagulation   . Hyperlipidemia   . Hypertension   . Arthritis     Osteoarthritis  . Syncope 09/11/2008  . Osteoarthrosis, unspecified whether generalized or localized,  unspecified site   . Long term (current) use of anticoagulants   . Congestive heart failure, unspecified   . Senile osteoporosis   . Other B-complex deficiencies   . Unspecified vitamin D deficiency   . Abnormality of gait   . Anemia, unspecified     Past Surgical History  Procedure Laterality Date  . Coronary angioplasty  07/2000    LAD  . Coronary artery bypass graft  08/2000    LIMA to LAD, SVG to RCA and PD, SVG to OM1 and OM2, and SVG to DX  . Right hip surgery Right 1996 & 2012    Replacement  . Left hip surgery Left 04/18/2005    Total replacement  . Knee arthroscopy Right     Arthroscopic surgery  . Eye surgery  2001    Cataract surgery  . Eye surgery  2006    Cataract surgery  . Esophagogastroduodenoscopy endoscopy    . Colon surgery  2004    Colonoscopy  . Colon surgery  10/29/2003    Colonoscopy    Allergies  Allergen Reactions  . Aricept [Donepezil]     lethargy  . Codeine Nausea And Vomiting  . Coreg [Carvedilol] Other (See Comments)  Bradycardia   . Penicillins Rash  . Tramadol Nausea Only      Medication List       This list is accurate as of: 05/10/15  2:54 PM.  Always use your most recent med list.               CALCIUM + D PO  Take 600 mg by mouth daily. 3 TABS DAILY     furosemide 20 MG tablet  Commonly known as:  LASIX  Take 20 mg by mouth daily. Only take if weight gain 3lbs in 24 hours     Magnesium Oxide 400 (240 Mg) MG Tabs  TAKE 1 TABLET DAILY.     nitroGLYCERIN 0.4 MG SL tablet  Commonly known as:  NITROSTAT  Place 1 tablet (0.4 mg total) under the tongue every 5 (five) minutes as needed for chest pain.     omeprazole 20 MG capsule  Commonly known as:  PRILOSEC  TAKE 1 CAPSULE DAILY FOR STOMACH ACID     potassium chloride 10 MEQ tablet  Commonly known as:  K-DUR  TAKE 1 TABLET TWICE DAILY. Only when taking lasix     simvastatin 40 MG tablet  Commonly known as:  ZOCOR  TAKE 1 TABLET ONCE DAILY FOR CHOLESTEROL.      TYLENOL 8 HOUR PO  Take 1,000 mg by mouth 2 (two) times daily.     warfarin 1 MG tablet  Commonly known as:  COUMADIN  Take 1 mg by mouth as directed. By coumadin guidelines        Review of Systems:  Review of Systems  Constitutional: Negative for fever, chills and weight loss.  HENT: Positive for hearing loss.        Can't hear out of right at all  Eyes:       Wears glasses  Respiratory: Positive for shortness of breath. Negative for cough.        On exertion when walks all through the house  Gastrointestinal: Negative for abdominal pain, constipation, blood in stool and melena.  Genitourinary: Negative for dysuria.  Musculoskeletal: Negative for falls.  Neurological: Negative for dizziness and loss of consciousness.  Endo/Heme/Allergies: Bruises/bleeds easily.  Psychiatric/Behavioral: Positive for memory loss.    Health Maintenance  Topic Date Due  . TETANUS/TDAP  03/23/1941  . INFLUENZA VACCINE  08/10/2015  . PNA vac Low Risk Adult (2 of 2 - PPSV23) 10/02/2015  . ZOSTAVAX  Completed    Physical Exam: Filed Vitals:   05/10/15 1433  BP: 142/78  Pulse: 65  Temp: 97.5 F (36.4 C)  TempSrc: Oral  Height: 6' (1.829 m)  Weight: 198 lb (89.812 kg)  SpO2: 97%   Body mass index is 26.85 kg/(m^2). Physical Exam  Constitutional: He appears well-developed and well-nourished. No distress.  Cardiovascular: Intact distal pulses.   irreg irreg  Pulmonary/Chest: Effort normal and breath sounds normal. He has no rales.  Musculoskeletal: Normal range of motion. He exhibits no tenderness.  Stooped posture; walks with walker; crepitus bilateral knees, nontender  Neurological: He is alert.  Short term memory loss  Skin: Skin is warm and dry.  Psychiatric: He has a normal mood and affect.    Labs reviewed: Basic Metabolic Panel:  Recent Labs  06/29/14 0923 12/28/14 1057  NA 136 134  K 4.2 4.3  CL 97 94*  CO2 22 21  GLUCOSE 89 89  BUN 12 14  CREATININE 0.90 0.85    CALCIUM 9.5 9.5  MG 1.6  --  Liver Function Tests:  Recent Labs  06/29/14 0923 12/28/14 1057  AST 25 29  ALT 15 18  ALKPHOS 50 54  BILITOT 0.9 0.8  PROT 7.0 6.8  ALBUMIN 4.8* 4.5   No results for input(s): LIPASE, AMYLASE in the last 8760 hours. No results for input(s): AMMONIA in the last 8760 hours. CBC:  Recent Labs  06/29/14 0923 12/28/14 1057  WBC 6.2 6.3  NEUTROABS 4.1 4.2  HCT 42.9 41.0  MCV 93 92  PLT  --  249   Lipid Panel:  Recent Labs  06/29/14 0923 12/28/14 1057  CHOL 149 156  HDL 52 57  LDLCALC 72 82  TRIG 125 87  CHOLHDL 2.9 2.7   Lab Results  Component Value Date   HGBA1C 6.0* 12/28/2014   Assessment/Plan 1. Vascular dementia, without behavioral disturbance -gradually worsening -his wife assists him with ADLs--she has some fear when he showers about falling, but does not want more help -cont current routine  2. Systolic CHF, chronic (HCC) -has prn lasix if gains weight, but rarely needed -cont same regimen and monitor  3. Hyperlipidemia - cont zocor  - Lipid panel; Future  4. Essential hypertension, benign -bp at goal usually--was worked up today waiting to be seen - CBC with Differential/Platelet; Future - Comprehensive metabolic panel; Future  5. Hyperglycemia -cont low sodium, low fat diet with occasional treats - Hemoglobin A1c; Future  6. Primary osteoarthritis of both knees -cont use of walker to help  -has end stage DJD and not a surgical candidate even a few years back and dementia and walking much worse now  7. PAF (paroxysmal atrial fibrillation) ([place]) - continue on coumadin with INR goal 2-3 - check today - Protime-INR - has not required meds for rate control  8. Anticoagulation goal of INR 2 to 3 - Protime-INR - CBC with Differential/Platelet; Future   Labs/tests ordered:   Orders Placed This Encounter  Procedures  . Protime-INR  . CBC with Differential/Platelet    Standing Status: Future      Number of Occurrences:      Standing Expiration Date: 01/10/2016  . Comprehensive metabolic panel    Standing Status: Future     Number of Occurrences:      Standing Expiration Date: 01/10/2016    Order Specific Question:  Has the patient fasted?    Answer:  Yes  . Hemoglobin A1c    Standing Status: Future     Number of Occurrences:      Standing Expiration Date: 01/10/2016  . Lipid panel    Standing Status: Future     Number of Occurrences:      Standing Expiration Date: 01/10/2016    Order Specific Question:  Has the patient fasted?    Answer:  Yes   Next appt:  09/16/2015 for med mgt, labs before   Mamou Zaylin Runco, D.O. [facility] [address], [place] [address] Cell Phone (Mon-Fri 8am-5pm):  [phone] On Call:  [phone] & follow prompts after 5pm & weekends Office Phone:  [phone] Office Fax:  [phone]

## 2015-05-11 LAB — PROTIME-INR
INR: 2.3 — ABNORMAL HIGH (ref 0.8–1.2)
Prothrombin Time: 23.4 s — ABNORMAL HIGH (ref 9.1–12.0)

## 2015-05-13 ENCOUNTER — Other Ambulatory Visit: Payer: Self-pay | Admitting: Internal Medicine

## 2015-05-17 DIAGNOSIS — D225 Melanocytic nevi of trunk: Secondary | ICD-10-CM | POA: Diagnosis not present

## 2015-05-17 DIAGNOSIS — L821 Other seborrheic keratosis: Secondary | ICD-10-CM | POA: Diagnosis not present

## 2015-05-17 DIAGNOSIS — L438 Other lichen planus: Secondary | ICD-10-CM | POA: Diagnosis not present

## 2015-05-17 DIAGNOSIS — D2262 Melanocytic nevi of left upper limb, including shoulder: Secondary | ICD-10-CM | POA: Diagnosis not present

## 2015-05-17 DIAGNOSIS — D2261 Melanocytic nevi of right upper limb, including shoulder: Secondary | ICD-10-CM | POA: Diagnosis not present

## 2015-05-26 ENCOUNTER — Other Ambulatory Visit: Payer: Self-pay | Admitting: Internal Medicine

## 2015-06-11 ENCOUNTER — Other Ambulatory Visit: Payer: Self-pay | Admitting: Internal Medicine

## 2015-06-11 ENCOUNTER — Other Ambulatory Visit: Payer: Medicare Other

## 2015-06-11 DIAGNOSIS — I4891 Unspecified atrial fibrillation: Secondary | ICD-10-CM

## 2015-06-11 DIAGNOSIS — Z7901 Long term (current) use of anticoagulants: Secondary | ICD-10-CM | POA: Diagnosis not present

## 2015-06-27 ENCOUNTER — Other Ambulatory Visit: Payer: Self-pay | Admitting: Internal Medicine

## 2015-07-09 ENCOUNTER — Encounter: Payer: Self-pay | Admitting: Cardiology

## 2015-07-09 ENCOUNTER — Ambulatory Visit (INDEPENDENT_AMBULATORY_CARE_PROVIDER_SITE_OTHER): Payer: Medicare Other | Admitting: Cardiology

## 2015-07-09 VITALS — BP 142/90 | HR 74 | Ht 72.0 in | Wt 192.0 lb

## 2015-07-09 DIAGNOSIS — I5022 Chronic systolic (congestive) heart failure: Secondary | ICD-10-CM

## 2015-07-09 DIAGNOSIS — M1711 Unilateral primary osteoarthritis, right knee: Secondary | ICD-10-CM | POA: Diagnosis not present

## 2015-07-09 DIAGNOSIS — M25561 Pain in right knee: Secondary | ICD-10-CM | POA: Diagnosis not present

## 2015-07-09 DIAGNOSIS — I482 Chronic atrial fibrillation, unspecified: Secondary | ICD-10-CM

## 2015-07-09 DIAGNOSIS — M25562 Pain in left knee: Secondary | ICD-10-CM | POA: Diagnosis not present

## 2015-07-09 DIAGNOSIS — I10 Essential (primary) hypertension: Secondary | ICD-10-CM

## 2015-07-09 DIAGNOSIS — M1712 Unilateral primary osteoarthritis, left knee: Secondary | ICD-10-CM | POA: Diagnosis not present

## 2015-07-09 DIAGNOSIS — M170 Bilateral primary osteoarthritis of knee: Secondary | ICD-10-CM | POA: Diagnosis not present

## 2015-07-09 DIAGNOSIS — I255 Ischemic cardiomyopathy: Secondary | ICD-10-CM | POA: Diagnosis not present

## 2015-07-09 NOTE — Patient Instructions (Signed)
Continue your current therapy  I will see you in 6 months.   

## 2015-07-09 NOTE — Progress Notes (Signed)
Troy Freeman Date of Birth: [birth year] Medical Record [id number]  History of Present Illness: [name] is seen for a followup visit. He is 89. He is seen with his wife and is in a wheelchair. He has known CAD with LV dysfunction. He has been managed conservatively. His other problems includes chronic atrial fib, coumadin therapy and progressive dementia. His wife provides around the clock care for him. He is  hard of hearing.  He has a history of severe bradycardia on beta blockers and hypotension on ACEi/ARB therapy. He has remote history of anterior MI and prior CABG.   On follow up today he is doing OK.  No new issues. Still limited in ability to ambulate. Walks short distances with walker.  He fell yesterday. was seen by Dr. Alvan Freeman today and both knees injected. No complaints of dyspnea or chest pain.  His swelling is doing well.   Current Outpatient Prescriptions on File Prior to Visit  Medication Sig Dispense Refill  . Acetaminophen (TYLENOL 8 HOUR PO) Take 1,000 mg by mouth 3 (three) times daily. Reported on 07/09/2015    . Calcium Carbonate-Vitamin D (CALCIUM + D PO) Take 600 mg by mouth daily. 3 TABS DAILY     . furosemide (LASIX) 20 MG tablet Take 20 mg by mouth daily. Only take if weight gain 3lbs in 24 hours    . nitroGLYCERIN (NITROSTAT) 0.4 MG SL tablet Place 1 tablet (0.4 mg total) under the tongue every 5 (five) minutes as needed for chest pain. 25 tablet 3   No current facility-administered medications on file prior to visit.    Allergies  Allergen Reactions  . Aricept [Donepezil]     lethargy  . Codeine Nausea And Vomiting  . Coreg [Carvedilol] Other (See Comments)    Bradycardia   . Penicillins Rash  . Tramadol Nausea Only    Past Medical History  Diagnosis Date  . Bradycardia   . IHD (ischemic heart disease) July 2002    Large anterior MI with PCI to LAD with subsequent CABG x 6 in 2002  . LV dysfunction     EF is 30 to 35%; does not tolerate ACE  due to cough and dizziness  . MI, old     OLD ANTERIOR MI  . Atrial fibrillation ([place])     managed on coumadin and with rate control  . S/P CABG (coronary artery bypass graft) 2002  . Fall Aug 2012    with fractured right hip  . BPH (benign prostatic hyperplasia)   . GERD (gastroesophageal reflux disease)   . Dementia     mild  . Chronic anticoagulation   . Hyperlipidemia   . Hypertension   . Arthritis     Osteoarthritis  . Syncope 09/11/2008  . Osteoarthrosis, unspecified whether generalized or localized, unspecified site   . Long term (current) use of anticoagulants   . Congestive heart failure, unspecified   . Senile osteoporosis   . Other B-complex deficiencies   . Unspecified vitamin D deficiency   . Abnormality of gait   . Anemia, unspecified     Past Surgical History  Procedure Laterality Date  . Coronary angioplasty  07/2000    LAD  . Coronary artery bypass graft  08/2000    LIMA to LAD, SVG to RCA and PD, SVG to OM1 and OM2, and SVG to DX  . Right hip surgery Right 1996 & 2012    Replacement  . Left hip surgery Left 04/18/2005  Total replacement  . Knee arthroscopy Right     Arthroscopic surgery  . Eye surgery  2001    Cataract surgery  . Eye surgery  2006    Cataract surgery  . Esophagogastroduodenoscopy endoscopy    . Colon surgery  2004    Colonoscopy  . Colon surgery  10/29/2003    Colonoscopy    History  Smoking status  . Former Smoker -- 1.00 packs/day for 30 years  . Types: Cigarettes  . Quit date: 01/09/1958  Smokeless tobacco  . Never Used    History  Alcohol Use No    Family History  Problem Relation Age of Onset  . Cancer Mother   . Stroke Mother   . Heart disease Father   . Stroke Father   . Cancer Sister   . Cancer Brother   . Early death Brother     Review of Systems: The review of systems is per the HPI.  All other systems were reviewed and are negative.  Physical Exam: BP 142/90 mmHg  Pulse 74  Ht 6' (1.829 m)   Wt 192 lb (87.091 kg)  BMI 26.03 kg/m2 Patient is very pleasant and in no acute distress. He is quite hard of hearing. Skin is warm and dry. Color is normal.  HEENT is unremarkable. Normocephalic/atraumatic. PERRL. Sclera are nonicteric. Neck is supple. No masses. No JVD. Lungs are clear. Cardiac exam shows an irregular rhythm. His rate is controlled.  Old sternotomy scar. Normal S1-2 without murmur. Abdomen is soft. Extremities reveal tr edema. Gait and ROM are intact. He is using a walker. No gross neurologic deficits noted.  LABORATORY DATA:    Lab Results  Component Value Date   WBC 6.3 12/28/2014   HGB 13.9 12/23/2013   HCT 41.0 12/28/2014   PLT 249 12/28/2014   GLUCOSE 89 12/28/2014   CHOL 156 12/28/2014   TRIG 87 12/28/2014   HDL 57 12/28/2014   LDLCALC 82 12/28/2014   ALT 18 12/28/2014   AST 29 12/28/2014   NA 134 12/28/2014   K 4.3 12/28/2014   CL 94* 12/28/2014   CREATININE 0.85 12/28/2014   BUN 14 12/28/2014   CO2 21 12/28/2014   TSH 3.488 08/21/2010   INR 2.2* 06/11/2015   HGBA1C 6.0* 12/28/2014      Assessment / Plan: 1. Bradycardia -resolved since beta blocker discontinued.   2. CAD -old anterior MI. s/p CABG- no chest pain. Conservative therapy.  3. Atrial fib - rate is controlled. He is on chronic anticoagulation with coumadin. INR therapeutic.  4. Ischemic cardiomyopathy EF 35%. Asymptomatic. Intolerant of meds as noted above. Continue to monitor. Use lasix prn swelling. Restrict sodium intake.   I will follow up in 6 months.

## 2015-07-12 ENCOUNTER — Other Ambulatory Visit: Payer: Self-pay | Admitting: *Deleted

## 2015-07-12 ENCOUNTER — Other Ambulatory Visit: Payer: Self-pay | Admitting: Internal Medicine

## 2015-07-12 ENCOUNTER — Ambulatory Visit (INDEPENDENT_AMBULATORY_CARE_PROVIDER_SITE_OTHER): Payer: Medicare Other | Admitting: Nurse Practitioner

## 2015-07-12 ENCOUNTER — Encounter: Payer: Self-pay | Admitting: Nurse Practitioner

## 2015-07-12 VITALS — BP 130/84 | HR 76 | Temp 98.0°F | Resp 16 | Ht 72.0 in | Wt 201.8 lb

## 2015-07-12 DIAGNOSIS — Z7901 Long term (current) use of anticoagulants: Secondary | ICD-10-CM | POA: Diagnosis not present

## 2015-07-12 DIAGNOSIS — R05 Cough: Secondary | ICD-10-CM

## 2015-07-12 DIAGNOSIS — I4891 Unspecified atrial fibrillation: Secondary | ICD-10-CM

## 2015-07-12 DIAGNOSIS — R059 Cough, unspecified: Secondary | ICD-10-CM

## 2015-07-12 MED ORDER — WARFARIN SODIUM 1 MG PO TABS: ORAL_TABLET | ORAL | Status: DC

## 2015-07-12 MED ORDER — OMEPRAZOLE 20 MG PO CPDR: 20.0000 mg | DELAYED_RELEASE_CAPSULE | Freq: Every day | ORAL | Status: DC

## 2015-07-12 NOTE — Progress Notes (Signed)
Patient ID: Troy Freeman, male   DOB: [birth year], 82 y.o.   MRN: [id number]    PCP: Hollace Kinnier, DO  Advanced Directive information Does patient have an advance directive?: Yes, Type of Advance Directive: [place];Living will, Does patient want to make changes to advanced directive?: No - Patient declined  Allergies  Allergen Reactions  . Aricept [Donepezil]     lethargy  . Codeine Nausea And Vomiting  . Coreg [Carvedilol] Other (See Comments)    Bradycardia   . Penicillins Rash  . Tramadol Nausea Only    Chief Complaint  Patient presents with  . Acute Visit    Cough x 1 week. No nasal drainage, no mucus with cough. Started mucinex on wed  . OTHER    Request that Protime be done today due to difficulty getting pt to and from doctor  . OTHER    Wife in room with patient.      HPI: Patient is a 82 y.o. male seen in the office today due to cough for 1 week. Wife with him today.  Reports Rattling chest and cough. Has extensive heart history so she wanted him to be check out. Went to cardiologist last week and he said everything was clear.  Needed to get PT/INR today in the lab so she wanted him to be seen. No fevers or chills. Not productive.  Does not have seasonal allergies.  Wife can not sleep at night with his "rattle" at night No increase in shortness of breath. Does tire easily and has dyspnea on exertion but still does his exercise bike.  Started him on mucinex DM last week.  Better today.     Review of Systems:  Review of Systems  Constitutional: Negative for fever and chills.  HENT: Positive for hearing loss. Negative for congestion, postnasal drip, rhinorrhea and sinus pressure.        Can't hear out of right at all  Eyes:       Wears glasses  Respiratory: Positive for cough and shortness of breath.        On exertion when walks all through the house  Gastrointestinal: Negative for abdominal pain, constipation and blood in stool.    Neurological: Negative for dizziness.    Past Medical History  Diagnosis Date  . Bradycardia   . IHD (ischemic heart disease) July 2002    Large anterior MI with PCI to LAD with subsequent CABG x 6 in 2002  . LV dysfunction     EF is 30 to 35%; does not tolerate ACE due to cough and dizziness  . MI, old     OLD ANTERIOR MI  . Atrial fibrillation ([place])     managed on coumadin and with rate control  . S/P CABG (coronary artery bypass graft) 2002  . Fall Aug 2012    with fractured right hip  . BPH (benign prostatic hyperplasia)   . GERD (gastroesophageal reflux disease)   . Dementia     mild  . Chronic anticoagulation   . Hyperlipidemia   . Hypertension   . Arthritis     Osteoarthritis  . Syncope 09/11/2008  . Osteoarthrosis, unspecified whether generalized or localized, unspecified site   . Long term (current) use of anticoagulants   . Congestive heart failure, unspecified   . Senile osteoporosis   . Other B-complex deficiencies   . Unspecified vitamin D deficiency   . Abnormality of gait   . Anemia, unspecified    Past  Surgical History  Procedure Laterality Date  . Coronary angioplasty  07/2000    LAD  . Coronary artery bypass graft  08/2000    LIMA to LAD, SVG to RCA and PD, SVG to OM1 and OM2, and SVG to DX  . Right hip surgery Right 1996 & 2012    Replacement  . Left hip surgery Left 04/18/2005    Total replacement  . Knee arthroscopy Right     Arthroscopic surgery  . Eye surgery  2001    Cataract surgery  . Eye surgery  2006    Cataract surgery  . Esophagogastroduodenoscopy endoscopy    . Colon surgery  2004    Colonoscopy  . Colon surgery  10/29/2003    Colonoscopy   Social History:   reports that he quit smoking about 57 years ago. His smoking use included Cigarettes. He has a 30 pack-year smoking history. He has never used smokeless tobacco. He reports that he does not drink alcohol or use illicit drugs.  Family History  Problem Relation Age of  Onset  . Cancer Mother   . Stroke Mother   . Heart disease Father   . Stroke Father   . Cancer Sister   . Cancer Brother   . Early death Brother     Medications: Patient's Medications  New Prescriptions   No medications on file  Previous Medications   ACETAMINOPHEN (TYLENOL 8 HOUR PO)    Take 1,000 mg by mouth 3 (three) times daily. Reported on 07/09/2015   CALCIUM CARBONATE-VITAMIN D (CALCIUM + D PO)    Take 600 mg by mouth daily. 3 TABS DAILY    FUROSEMIDE (LASIX) 20 MG TABLET    Take 20 mg by mouth daily. Only take if weight gain 3lbs in 24 hours   MAGNESIUM OXIDE (MAG-OX) 400 MG TABLET    Take 400 mg by mouth daily.   NITROGLYCERIN (NITROSTAT) 0.4 MG SL TABLET    Place 1 tablet (0.4 mg total) under the tongue every 5 (five) minutes as needed for chest pain.   OMEPRAZOLE (PRILOSEC) 20 MG CAPSULE    Take 20 mg by mouth daily. (For Stomach Acid)   POTASSIUM CHLORIDE (K-DUR) 10 MEQ TABLET    Take 10 mEq by mouth daily as needed ((Only when taking Lasix)).   SIMVASTATIN (ZOCOR) 40 MG TABLET    TAKE 1 TABLET ONCE DAILY FOR CHOLESTEROL.   WARFARIN (COUMADIN) 1 MG TABLET    Take four (4) tablets (4 mg) by mouth daily on Mondays, Wednesdays, and Fridays. Take three (3) tablets (3 mg) by mouth daily on Tuesdays, Thursdays, Saturdays, and Sundays.  Modified Medications   No medications on file  Discontinued Medications   No medications on file     Physical Exam:  Filed Vitals:   07/12/15 1338  BP: 130/84  Pulse: 76  Temp: 98 F (36.7 C)  TempSrc: Oral  Resp: 16  Height: 6' (1.829 m)  Weight: 201 lb 12.8 oz (91.536 kg)  SpO2: 94%   Body mass index is 27.36 kg/(m^2).  Physical Exam  Constitutional: He appears well-developed and well-nourished. No distress.  Cardiovascular: Intact distal pulses.   irreg irreg  Pulmonary/Chest: Effort normal and breath sounds normal. No respiratory distress. He has no wheezes.  Musculoskeletal: Normal range of motion. He exhibits no  tenderness.  Stooped posture; walks with walker; crepitus bilateral knees, nontender  Neurological: He is alert.  Short term memory loss  Skin: Skin is warm and dry.  Psychiatric:  He has a normal mood and affect.    Labs reviewed: Basic Metabolic Panel:  Recent Labs  12/28/14 1057  NA 134  K 4.3  CL 94*  CO2 21  GLUCOSE 89  BUN 14  CREATININE 0.85  CALCIUM 9.5   Liver Function Tests:  Recent Labs  12/28/14 1057  AST 29  ALT 18  ALKPHOS 54  BILITOT 0.8  PROT 6.8  ALBUMIN 4.5   No results for input(s): LIPASE, AMYLASE in the last 8760 hours. No results for input(s): AMMONIA in the last 8760 hours. CBC:  Recent Labs  12/28/14 1057  WBC 6.3  NEUTROABS 4.2  HCT 41.0  MCV 92  PLT 249   Lipid Panel:  Recent Labs  12/28/14 1057  CHOL 156  HDL 57  LDLCALC 82  TRIG 87  CHOLHDL 2.7   TSH: No results for input(s): TSH in the last 8760 hours. A1C: Lab Results  Component Value Date   HGBA1C 6.0* 12/28/2014     Assessment/Plan 1. Atrial fibrillation, unspecified -rate controlled, not on any medications for rate. conts on coumadin.  - Protime-INR  2. Long term current use of anticoagulant therapy - Protime-INR today  3. Cough -lungs clear, has improved with Mucinex DM, to cont as needed   To keep upcoming appt   Auriana Scalia K. Harle Battiest  [facility] [phone] 8 am - 5 pm) [phone] (after hours)

## 2015-07-13 LAB — PROTIME-INR
INR: 3.5 — ABNORMAL HIGH (ref 0.8–1.2)
Prothrombin Time: 35.5 s — ABNORMAL HIGH (ref 9.1–12.0)

## 2015-07-16 ENCOUNTER — Other Ambulatory Visit: Payer: Medicare Other

## 2015-07-19 ENCOUNTER — Other Ambulatory Visit: Payer: Self-pay

## 2015-07-19 ENCOUNTER — Other Ambulatory Visit: Payer: Medicare Other

## 2015-07-19 DIAGNOSIS — R739 Hyperglycemia, unspecified: Secondary | ICD-10-CM | POA: Diagnosis not present

## 2015-07-19 DIAGNOSIS — E785 Hyperlipidemia, unspecified: Secondary | ICD-10-CM | POA: Diagnosis not present

## 2015-07-19 DIAGNOSIS — Z7901 Long term (current) use of anticoagulants: Secondary | ICD-10-CM | POA: Diagnosis not present

## 2015-07-19 DIAGNOSIS — I480 Paroxysmal atrial fibrillation: Secondary | ICD-10-CM

## 2015-07-19 DIAGNOSIS — I10 Essential (primary) hypertension: Secondary | ICD-10-CM

## 2015-07-20 LAB — LIPID PANEL
Cholesterol: 134 mg/dL (ref 125–200)
HDL: 52 mg/dL (ref >=40)
LDL Cholesterol: 60 mg/dL (ref <130)
Total CHOL/HDL Ratio: 2.6 Ratio (ref <=5.0)
Triglycerides: 112 mg/dL (ref <150)
VLDL: 22 mg/dL (ref <30)

## 2015-07-20 LAB — CBC WITH DIFFERENTIAL/PLATELET
Basophils Absolute: 0 cells/uL (ref 0–200)
Basophils Relative: 0 %
Eosinophils Absolute: 72 cells/uL (ref 15–500)
Eosinophils Relative: 1 %
HCT: 40.6 % (ref 38.5–50.0)
Hemoglobin: 13.9 g/dL (ref 13.2–17.1)
Lymphocytes Relative: 15 %
Lymphs Abs: 1080 cells/uL (ref 850–3900)
MCH: 31.9 pg (ref 27.0–33.0)
MCHC: 34.2 g/dL (ref 32.0–36.0)
MCV: 93.1 fL (ref 80.0–100.0)
MPV: 9.5 fL (ref 7.5–12.5)
Monocytes Absolute: 936 cells/uL (ref 200–950)
Monocytes Relative: 13 %
Neutro Abs: 5112 cells/uL (ref 1500–7800)
Neutrophils Relative %: 71 %
Platelets: 315 10*3/uL (ref 140–400)
RBC: 4.36 MIL/uL (ref 4.20–5.80)
RDW: 13.8 % (ref 11.0–15.0)
WBC: 7.2 10*3/uL (ref 3.8–10.8)

## 2015-07-20 LAB — COMPLETE METABOLIC PANEL WITH GFR
ALT: 22 U/L (ref 9–46)
AST: 30 U/L (ref 10–35)
Albumin: 4.2 g/dL (ref 3.6–5.1)
Alkaline Phosphatase: 49 U/L (ref 40–115)
BUN: 13 mg/dL (ref 7–25)
CO2: 26 mmol/L (ref 20–31)
Calcium: 9.2 mg/dL (ref 8.6–10.3)
Chloride: 96 mmol/L — ABNORMAL LOW (ref 98–110)
Creat: 0.87 mg/dL (ref 0.70–1.11)
GFR, Est African American: 86 mL/min (ref >=60)
GFR, Est Non African American: 74 mL/min (ref >=60)
Glucose, Bld: 88 mg/dL (ref 65–99)
Potassium: 4.4 mmol/L (ref 3.5–5.3)
Sodium: 132 mmol/L — ABNORMAL LOW (ref 135–146)
Total Bilirubin: 0.6 mg/dL (ref 0.2–1.2)
Total Protein: 6.5 g/dL (ref 6.1–8.1)

## 2015-07-20 LAB — PROTIME-INR
INR: 1.6 — ABNORMAL HIGH
Prothrombin Time: 16.2 s — ABNORMAL HIGH (ref 9.0–11.5)

## 2015-07-20 LAB — HEMOGLOBIN A1C
Hgb A1c MFr Bld: 6.1 % — ABNORMAL HIGH (ref <5.7)
Mean Plasma Glucose: 128 mg/dL

## 2015-07-21 LAB — PROTIME-INR
INR: 2.2 — ABNORMAL HIGH (ref 0.8–1.2)
Prothrombin Time: 22.6 s — ABNORMAL HIGH (ref 9.1–12.0)

## 2015-07-22 ENCOUNTER — Other Ambulatory Visit: Payer: Self-pay

## 2015-07-22 DIAGNOSIS — I259 Chronic ischemic heart disease, unspecified: Secondary | ICD-10-CM

## 2015-07-26 ENCOUNTER — Other Ambulatory Visit: Payer: Medicare Other

## 2015-07-26 ENCOUNTER — Other Ambulatory Visit: Payer: Self-pay | Admitting: Internal Medicine

## 2015-07-26 DIAGNOSIS — Z7901 Long term (current) use of anticoagulants: Secondary | ICD-10-CM

## 2015-07-26 DIAGNOSIS — I480 Paroxysmal atrial fibrillation: Secondary | ICD-10-CM | POA: Diagnosis not present

## 2015-07-26 LAB — PROTIME-INR
INR: 1.8 — ABNORMAL HIGH
Prothrombin Time: 19.1 s — ABNORMAL HIGH (ref 9.0–11.5)

## 2015-08-24 ENCOUNTER — Telehealth: Payer: Self-pay | Admitting: *Deleted

## 2015-08-24 DIAGNOSIS — I4891 Unspecified atrial fibrillation: Secondary | ICD-10-CM

## 2015-08-24 NOTE — Telephone Encounter (Signed)
Wife called to schedule an INR draw. Appointment scheduled and order placed.

## 2015-08-26 ENCOUNTER — Other Ambulatory Visit: Payer: Medicare Other

## 2015-08-26 ENCOUNTER — Other Ambulatory Visit: Payer: Self-pay

## 2015-08-26 DIAGNOSIS — E871 Hypo-osmolality and hyponatremia: Secondary | ICD-10-CM

## 2015-08-26 DIAGNOSIS — I259 Chronic ischemic heart disease, unspecified: Secondary | ICD-10-CM | POA: Diagnosis not present

## 2015-08-26 DIAGNOSIS — I4891 Unspecified atrial fibrillation: Secondary | ICD-10-CM

## 2015-08-26 LAB — PROTIME-INR
INR: 2.1 — ABNORMAL HIGH
Prothrombin Time: 21.3 s — ABNORMAL HIGH (ref 9.0–11.5)

## 2015-09-09 ENCOUNTER — Other Ambulatory Visit: Payer: Self-pay | Admitting: Internal Medicine

## 2015-09-10 ENCOUNTER — Other Ambulatory Visit: Payer: Medicare Other

## 2015-09-10 DIAGNOSIS — Z7901 Long term (current) use of anticoagulants: Secondary | ICD-10-CM | POA: Diagnosis not present

## 2015-09-10 DIAGNOSIS — I480 Paroxysmal atrial fibrillation: Secondary | ICD-10-CM | POA: Diagnosis not present

## 2015-09-10 DIAGNOSIS — E871 Hypo-osmolality and hyponatremia: Secondary | ICD-10-CM | POA: Diagnosis not present

## 2015-09-10 LAB — BASIC METABOLIC PANEL WITH GFR
BUN: 11 mg/dL (ref 7–25)
CO2: 24 mmol/L (ref 20–31)
Calcium: 9 mg/dL (ref 8.6–10.3)
Chloride: 98 mmol/L (ref 98–110)
Creat: 0.89 mg/dL (ref 0.70–1.11)
GFR, Est African American: 85 mL/min (ref >=60)
GFR, Est Non African American: 74 mL/min (ref >=60)
Glucose, Bld: 90 mg/dL (ref 65–99)
Potassium: 4.3 mmol/L (ref 3.5–5.3)
Sodium: 132 mmol/L — ABNORMAL LOW (ref 135–146)

## 2015-09-10 LAB — PROTIME-INR
INR: 2.2 — ABNORMAL HIGH
Prothrombin Time: 22.2 s — ABNORMAL HIGH (ref 9.0–11.5)

## 2015-09-10 MED ORDER — WARFARIN SODIUM 1 MG PO TABS: ORAL_TABLET | ORAL | 11 refills | Status: AC

## 2015-09-16 ENCOUNTER — Encounter: Payer: Self-pay | Admitting: Internal Medicine

## 2015-09-16 ENCOUNTER — Ambulatory Visit (INDEPENDENT_AMBULATORY_CARE_PROVIDER_SITE_OTHER): Payer: Medicare Other | Admitting: Internal Medicine

## 2015-09-16 VITALS — BP 140/60 | HR 67 | Temp 97.8°F | Wt 197.0 lb

## 2015-09-16 DIAGNOSIS — R059 Cough, unspecified: Secondary | ICD-10-CM

## 2015-09-16 DIAGNOSIS — R05 Cough: Secondary | ICD-10-CM

## 2015-09-16 DIAGNOSIS — I5022 Chronic systolic (congestive) heart failure: Secondary | ICD-10-CM | POA: Diagnosis not present

## 2015-09-16 DIAGNOSIS — Z7901 Long term (current) use of anticoagulants: Secondary | ICD-10-CM

## 2015-09-16 DIAGNOSIS — F0150 Vascular dementia without behavioral disturbance: Secondary | ICD-10-CM | POA: Diagnosis not present

## 2015-09-16 DIAGNOSIS — R296 Repeated falls: Secondary | ICD-10-CM

## 2015-09-16 DIAGNOSIS — I4891 Unspecified atrial fibrillation: Secondary | ICD-10-CM | POA: Diagnosis not present
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
## 2015-09-30 ENCOUNTER — Other Ambulatory Visit: Payer: Self-pay | Admitting: Internal Medicine

## 2015-10-04 ENCOUNTER — Other Ambulatory Visit: Payer: Self-pay | Admitting: Internal Medicine

## 2015-10-11 DIAGNOSIS — M170 Bilateral primary osteoarthritis of knee: Secondary | ICD-10-CM | POA: Diagnosis not present

## 2015-10-11 DIAGNOSIS — M25561 Pain in right knee: Secondary | ICD-10-CM | POA: Diagnosis not present

## 2015-10-11 DIAGNOSIS — M25551 Pain in right hip: Secondary | ICD-10-CM | POA: Diagnosis not present

## 2015-10-12 ENCOUNTER — Other Ambulatory Visit: Payer: Self-pay

## 2015-10-12 ENCOUNTER — Ambulatory Visit (INDEPENDENT_AMBULATORY_CARE_PROVIDER_SITE_OTHER): Payer: Medicare Other

## 2015-10-12 ENCOUNTER — Other Ambulatory Visit: Payer: Medicare Other

## 2015-10-12 DIAGNOSIS — I482 Chronic atrial fibrillation, unspecified: Secondary | ICD-10-CM

## 2015-10-12 DIAGNOSIS — Z23 Encounter for immunization: Secondary | ICD-10-CM

## 2015-10-12 DIAGNOSIS — Z7901 Long term (current) use of anticoagulants: Secondary | ICD-10-CM

## 2015-10-12 DIAGNOSIS — I4891 Unspecified atrial fibrillation: Secondary | ICD-10-CM

## 2015-10-12 LAB — PROTIME-INR
INR: 3.5 — ABNORMAL HIGH
Prothrombin Time: 35.3 s — ABNORMAL HIGH (ref 9.0–11.5)

## 2015-10-14 ENCOUNTER — Other Ambulatory Visit: Payer: Self-pay | Admitting: Internal Medicine

## 2015-10-14 DIAGNOSIS — R05 Cough: Secondary | ICD-10-CM

## 2015-10-14 DIAGNOSIS — R059 Cough, unspecified: Secondary | ICD-10-CM

## 2015-10-15 ENCOUNTER — Telehealth: Payer: Self-pay | Admitting: *Deleted

## 2015-10-15 ENCOUNTER — Ambulatory Visit
Admission: RE | Admit: 2015-10-15 | Discharge: 2015-10-15 | Disposition: A | Discharge status: Home / Self Care | Payer: Medicare Other | Source: Ambulatory Visit | Attending: Internal Medicine | Admitting: Internal Medicine

## 2015-10-15 DIAGNOSIS — R059 Cough, unspecified: Secondary | ICD-10-CM

## 2015-10-15 DIAGNOSIS — R05 Cough: Secondary | ICD-10-CM

## 2015-10-15 MED ORDER — SACCHAROMYCES BOULARDII 250 MG PO CAPS: 250.0000 mg | ORAL_CAPSULE | Freq: Two times a day (BID) | ORAL | 0 refills | Status: AC

## 2015-10-15 MED ORDER — LEVOFLOXACIN 500 MG PO TABS: 500.0000 mg | ORAL_TABLET | Freq: Every day | ORAL | 0 refills | Status: AC

## 2015-10-15 NOTE — Telephone Encounter (Signed)
-----   Message from Gayland Curry, DO sent at 10/15/2015  2:25 PM EDT ----- Chest xray does show an infiltrate suggestive of pneumonia in combination with his coughing.  Let's treat him with levaquin 500mg  po daily x 7 days for pneumonia.  Also, he should either eat yogurt daily or take a probiotic to prevent diarrhea like florastor 250mg  po bid for 7 days also.  Charleston Ropes should call right away if she notices him having chills, fever, or becoming very short of breath.

## 2015-10-15 NOTE — Telephone Encounter (Signed)
Spoke with patient's wife and advised results rx sent to pharmacy by e-script  

## 2015-10-18 ENCOUNTER — Other Ambulatory Visit: Payer: Medicare Other

## 2015-10-25 ENCOUNTER — Other Ambulatory Visit: Payer: Medicare Other

## 2015-10-25 DIAGNOSIS — Z7901 Long term (current) use of anticoagulants: Secondary | ICD-10-CM

## 2015-10-25 LAB — PROTIME-INR
INR: 2.1 — ABNORMAL HIGH
Prothrombin Time: 21.4 s — ABNORMAL HIGH (ref 9.0–11.5)

## 2015-10-28 ENCOUNTER — Other Ambulatory Visit: Payer: Self-pay | Admitting: *Deleted

## 2015-10-28 MED ORDER — OMEPRAZOLE 20 MG PO CPDR: 20.0000 mg | DELAYED_RELEASE_CAPSULE | Freq: Every day | ORAL | 3 refills | Status: AC

## 2015-11-08 ENCOUNTER — Other Ambulatory Visit: Payer: Medicare Other

## 2015-11-08 DIAGNOSIS — Z7901 Long term (current) use of anticoagulants: Secondary | ICD-10-CM | POA: Diagnosis not present

## 2015-11-08 DIAGNOSIS — I480 Paroxysmal atrial fibrillation: Secondary | ICD-10-CM | POA: Diagnosis not present

## 2015-11-08 LAB — PROTIME-INR
INR: 2.1 — ABNORMAL HIGH
Prothrombin Time: 21.6 s — ABNORMAL HIGH (ref 9.0–11.5)

## 2015-11-22 DIAGNOSIS — I2510 Atherosclerotic heart disease of native coronary artery without angina pectoris: Secondary | ICD-10-CM | POA: Diagnosis not present

## 2015-11-22 DIAGNOSIS — Z9181 History of falling: Secondary | ICD-10-CM | POA: Diagnosis not present

## 2015-11-22 DIAGNOSIS — I509 Heart failure, unspecified: Secondary | ICD-10-CM | POA: Diagnosis not present

## 2015-11-22 DIAGNOSIS — I482 Chronic atrial fibrillation: Secondary | ICD-10-CM | POA: Diagnosis not present

## 2015-12-16 ENCOUNTER — Ambulatory Visit: Payer: Medicare Other | Admitting: Internal Medicine

## 2015-12-19 ENCOUNTER — Inpatient Hospital Stay

## 2015-12-19 ENCOUNTER — Inpatient Hospital Stay
Admit: 2015-12-19 | Discharge: 2015-12-25 | Disposition: A | Discharge status: Rehabilitation Facility | Payer: MEDICARE | Attending: Hospitalist | Admitting: Hospitalist

## 2015-12-19 ENCOUNTER — Emergency Department: Admit: 2015-12-19 | Payer: MEDICARE | Primary: Family Medicine

## 2015-12-19 DIAGNOSIS — F0391 Unspecified dementia with behavioral disturbance: Principal | ICD-10-CM

## 2015-12-19 LAB — CBC WITH AUTOMATED DIFF
ABS. BASOPHILS: 0 10*3/uL (ref 0.0–0.1)
ABS. EOSINOPHILS: 0 10*3/uL (ref 0.0–0.4)
ABS. LYMPHOCYTES: 1.4 10*3/uL (ref 0.8–3.5)
ABS. MONOCYTES: 1.1 10*3/uL — ABNORMAL HIGH (ref 0.0–1.0)
ABS. NEUTROPHILS: 7 10*3/uL (ref 1.8–8.0)
BASOPHILS: 0 % (ref 0–1)
EOSINOPHILS: 0 % (ref 0–7)
HCT: 38 % (ref 36.6–50.3)
HGB: 12.8 g/dL (ref 12.1–17.0)
LYMPHOCYTES: 14 % (ref 12–49)
MCH: 31.6 PG (ref 26.0–34.0)
MCHC: 33.7 g/dL (ref 30.0–36.5)
MCV: 93.8 FL (ref 80.0–99.0)
MONOCYTES: 11 % (ref 5–13)
NEUTROPHILS: 75 % (ref 32–75)
PLATELET: 264 10*3/uL (ref 150–400)
RBC: 4.05 M/uL — ABNORMAL LOW (ref 4.10–5.70)
RDW: 12.9 % (ref 11.5–14.5)
WBC: 9.5 10*3/uL (ref 4.1–11.1)

## 2015-12-19 LAB — METABOLIC PANEL, COMPREHENSIVE
A-G Ratio: 0.9 — ABNORMAL LOW (ref 1.1–2.2)
ALT (SGPT): 19 U/L (ref 12–78)
AST (SGOT): 29 U/L (ref 15–37)
Albumin: 3.6 g/dL (ref 3.5–5.0)
Alk. phosphatase: 62 U/L (ref 45–117)
Anion gap: 7 mmol/L (ref 5–15)
BUN/Creatinine ratio: 21 — ABNORMAL HIGH (ref 12–20)
BUN: 19 MG/DL (ref 6–20)
Bilirubin, total: 0.9 MG/DL (ref 0.2–1.0)
CO2: 25 mmol/L (ref 21–32)
Calcium: 8.7 MG/DL (ref 8.5–10.1)
Chloride: 102 mmol/L (ref 97–108)
Creatinine: 0.89 MG/DL (ref 0.70–1.30)
GFR est AA: >60 mL/min/{1.73_m2} (ref >60)
GFR est non-AA: >60 mL/min/{1.73_m2} (ref >60)
Globulin: 3.8 g/dL (ref 2.0–4.0)
Glucose: 95 mg/dL (ref 65–100)
Potassium: 4.5 mmol/L (ref 3.5–5.1)
Protein, total: 7.4 g/dL (ref 6.4–8.2)
Sodium: 134 mmol/L — ABNORMAL LOW (ref 136–145)

## 2015-12-19 LAB — URINALYSIS W/ RFLX MICROSCOPIC
Bacteria: NEGATIVE /hpf
Bilirubin: NEGATIVE
Blood: NEGATIVE
Glucose: NEGATIVE mg/dL
Ketone: NEGATIVE mg/dL
Leukocyte Esterase: NEGATIVE
Nitrites: NEGATIVE
Protein: 100 mg/dL — AB
Specific gravity: 1.028 (ref 1.003–1.030)
Urobilinogen: 0.2 EU/dL (ref 0.2–1.0)
pH (UA): 7 (ref 5.0–8.0)

## 2015-12-19 LAB — PROTHROMBIN TIME + INR
INR: 1.1 (ref 0.9–1.1)
Prothrombin time: 10.8 s (ref 9.0–11.1)

## 2015-12-19 LAB — URINE CULTURE HOLD SAMPLE

## 2015-12-19 LAB — GLUCOSE, POC: Glucose (POC): 96 mg/dL (ref 65–100)

## 2015-12-19 MED ORDER — SODIUM CHLORIDE 0.9 % IV
INTRAVENOUS | Status: AC
Start: 2015-12-19 — End: 2015-12-19

## 2015-12-19 MED ORDER — HALOPERIDOL LACTATE 5 MG/ML IJ SOLN
5 mg/mL | INTRAMUSCULAR | Status: AC
Start: 2015-12-19 — End: 2015-12-19
  Administered 2015-12-19: 21:00:00 via INTRAMUSCULAR

## 2015-12-19 MED ORDER — HALOPERIDOL LACTATE 5 MG/ML IJ SOLN
5 mg/mL | Freq: Once | INTRAMUSCULAR | Status: AC
Start: 2015-12-19 — End: 2015-12-19
  Administered 2015-12-19: 21:00:00 via INTRAMUSCULAR

## 2015-12-19 MED ORDER — LORAZEPAM 2 MG/ML IJ SOLN
2 mg/mL | INTRAMUSCULAR | Status: AC
Start: 2015-12-19 — End: 2015-12-19
  Administered 2015-12-19: 23:00:00 via INTRAVENOUS

## 2015-12-19 MED FILL — HALOPERIDOL LACTATE 5 MG/ML IJ SOLN: 5 mg/mL | INTRAMUSCULAR | Qty: 1

## 2015-12-19 MED FILL — LORAZEPAM 2 MG/ML IJ SOLN: 2 mg/mL | INTRAMUSCULAR | Qty: 1

## 2015-12-19 MED FILL — SODIUM CHLORIDE 0.9 % IV: INTRAVENOUS | Qty: 1000

## 2015-12-19 NOTE — Progress Notes (Signed)
Patient is agitated and combative, swinging fist and kicking at staff. Unable to obtain accurate blood pressure at this time.

## 2015-12-19 NOTE — Progress Notes (Addendum)
Care Management-Family requested to speak with care manager regarding observation status and options for discharge.  Patient to ED for altered mental status.    Met with patient's son-in-law Dr. [name] [phone]). He is a Paediatric nurse. Patient lives with his wife [name] [name] [phone]) in a one level condo. They just moved to [place] area from [place] about 5 weeks ago. His wife is independent with her ADLs. She has been having increasing difficulty caring for him. The family has begun to look at life time care facilities and assisted living facilities. Patient and his wife were resistant to moving to a complex with only geriatric population.     Patient has seen his new PCP Dr. Dorathy Daft one time when he first moved to [place]. He has not seen any specialist.     Patient uses a Rollator to ambulate. He also has wheelchairs and several other walkers available to him. He is typically able to ambulate with some assistance, but the do take the transport wheelchair for long distances.     Explained observation status. Dr. Duard Larsen signed [name] letter and [facility] Observation Notice. ED secretary to sign them in to chart. Explained inpatient verses observation status and how that relates to SNF. Explained SNF verses inpatient rehab. If patient still remains in observation status, they patient can private pay for room and board in a nursing facility and receive therapy under part B services.     Patient's emergency contacts are his wife [name] [name] [phone]), his daughter [name] [name] [phone]), his above son-in-law, and patient's granddaughter Dr. [name] [phone]) who is a physiatrist at [facility].    Patient's primary insurance is Medicare and [employer] is secondary.     Dr. Duard Larsen asked about Medicaid eligibility for patient if needed. The combined income of patient and his wife is around $24,000/year. Patient  and his wife will pay off their condo with the money they receive for the sale of their [place] home. They will still have some funds after they pay off the condo. Exact amount is unknown.     Due to the significant change in mental status of patient, Dr. Duard Larsen does not feel that patient will be able to return home with his wife. He will need placement of some type. Gave him a list of area nursing homes and the number for [facility].     Unit CM to follow to assist with placement.    Care Management Interventions  PCP Verified by CM: Yes (Dr. Dorathy Daft)  Last Visit to PCP: 11/10/15  Mode of Transport at Discharge: BLS  Discharge Durable Medical Equipment: Yes  Physical Therapy Consult: Yes  Occupational Therapy Consult: Yes  Speech Therapy Consult: Yes  Current Support Network: Lives with Spouse  Confirm Follow Up Transport: Family  Plan discussed with Pt/Family/Caregiver: Yes  Discharge Location  Discharge Placement: Unable to determine at this time    Collier Flowers, MSW

## 2015-12-19 NOTE — ED Notes (Signed)
Pt continues to try and get up and get out of bed. Family and this nurse at bedside.

## 2015-12-19 NOTE — Progress Notes (Signed)
Patient is combative and agitated at this time, unable to collect labs.

## 2015-12-19 NOTE — H&P (Signed)
History & Physical  Dahlia Client, MD, MHS    Date of admission: 12/19/2015    Patient name: [name] [name]  MRN: [id number]  Date of birth: [birth year]  Age: 82 y.o.     Primary care provider:  Berniece Pap, MD     Source of Information: medical records and family at bedside                          Chief complaint: UTO due to patient's mental status; confusion and garbled speech per the family    History of present illness  [name] [name] is a 82 y.o. male CAD s/p CABx6, cardiomyopathy with reported EF 20%, chronic afib on ASA 66m, dementia, chronic hip dislocation, and hard of hearing who was BIBEMS to the ED from home with confusion, weakness, garbled speech.  Patient received Haldol and Ativan in the ED for agitation and combativeness and is unable to provide any history. Confusion started last night. Family reports that since last night, patient has been unable to do ADLs as usual. He has been unable to use the urinal, use his walker, eat, or take his meds as usual. He had some garbled speech today and c/o HA yesterday.  Family reports he has been c/o nausea but no vomiting with eating, progressive weakness, and progressive deafness. He was using his walker and slipped to the ground yesterday. He was switched from warfarin to ASA 836mdaily about 4 weeks ago due to falls. Wife denies head trauma or LOC. [place] due to his multiple falls. Wife has been unable to care for him by herself. Family denies recent f/c, CP, palpitations, abdominal pains, diarrhea/constipation, dysuria, recent weight changes. He has chronic b/l LE edema. Wife gives him Lasix prn weight gain >3 pounds. She reports he c/o SOB and wheezy when he gets fluid overloaded which resolve with Lasix.    ED triage VS were temp 98.52F, HR 87, BP 144/91, RR 15, SpO2 98% on RA. In  the ED, patient kept trying to get out of the stretcher, became more and more agitated and received Haldol 66m866mVx1 and Ativan 1mg13m x1. He is currently lethargic and unarousable to voice.     Past Medical History:   Diagnosis Date   ??? CAD (coronary artery disease)    ??? Ill-defined condition     MI 14 years ago    ??? Ill-defined condition     a fib      Past Surgical History:   Procedure Laterality Date   ??? HX ORTHOPAEDIC      left hip permantly dislocated     Prior to Admission medications    Medication Sig Start Date End Date Taking? Authorizing Provider   BABY ASPIRIN PO Take 81 mg by mouth.   Yes Phys Other, MD   simvastatin (ZOCOR) 40 mg tablet Take 40 mg by mouth nightly.   Yes Phys Other, MD   omeprazole (PRILOSEC) 20 mg capsule Take 20 mg by mouth daily.   Yes Phys Other, MD   magnesium oxide (MAG-OX) 400 mg tablet Take 400 mg by mouth daily.   Yes Phys Other, MD   potassium chloride SR (KLOR-CON 10) 10 mEq tablet Take 10 mEq by mouth two (2) times a day.   Yes Phys Other, MD   furosemide (LASIX) 20 mg tablet Take 20 mg by mouth daily.  Yes Phys Other, MD     Allergies   Allergen Reactions   ??? Codeine Nausea Only   ??? Penicillins Shortness of Breath      History reviewed. No pertinent family history.      Social history  Patient resides    Independently    x  With family care      Assisted living      SNF    Ambulates    Independently      With cane     x  Assisted walker         Alcohol history   x  None     Social     Chronic   Smoking history    None   x  Former smoker: quit 50 years ago     Current smoker     Denies illicit drug use.    History   Smoking Status   ??? Not on file   Smokeless Tobacco   ??? Not on file       Code status    Full code   x  DNR/DNI        Code status discussed with the wife and family at bedside. Patient has a DDNR which family will bring from home.    Review of systems  Review of systems not obtained due to patient factors.     Physical Examination   Visit Vitals    ??? BP (!) 144/91   ??? Pulse 87   ??? Temp 98.8 ??F (37.1 ??C)   ??? Resp 15   ??? Ht 6' 1"  (1.854 m)   ??? Wt 86.6 kg (191 lb)   ??? SpO2 98%   ??? BMI 25.2 kg/m2          O2 Device: Room air    Gen: lethargic and unarousable to voice, NAD  Head: NCAT  EENT: pupils pinpoint but reactive, unable to examine EOM, MMM, oropharynx benign  Neck: supple, no masses  Lungs: CTAB anteriorly, no w/r/r  CVS: regular rhythm, normal rate, S1S2, no m/r/g appreciated, b/l L>R LE pitting edema, +pulses  Abd: +BS, soft, NT, ND  MSK: unable to examine due to patient's mental status  Neuro: lethargic, unarousable to voice, withdraws to noxious stimuli  Skin: warm, dry; no rashes, lesions, ulcers noted    Data Review    EKG: poor date quality    24 Hour Results:  Recent Results (from the past 24 hour(s))   GLUCOSE, POC    Collection Time: 12/19/15  3:17 PM   Result Value Ref Range    Glucose (POC) 96 65 - 100 mg/dL    Performed by Rupert Stacks    CBC WITH AUTOMATED DIFF    Collection Time: 12/19/15  3:22 PM   Result Value Ref Range    WBC 9.5 4.1 - 11.1 K/uL    RBC 4.05 (L) 4.10 - 5.70 M/uL    HGB 12.8 12.1 - 17.0 g/dL    HCT 38.0 36.6 - 50.3 %    MCV 93.8 80.0 - 99.0 FL    MCH 31.6 26.0 - 34.0 PG    MCHC 33.7 30.0 - 36.5 g/dL    RDW 12.9 11.5 - 14.5 %    PLATELET 264 150 - 400 K/uL    NEUTROPHILS 75 32 - 75 %    LYMPHOCYTES 14 12 - 49 %    MONOCYTES 11 5 - 13 %    EOSINOPHILS 0 0 -  7 %    BASOPHILS 0 0 - 1 %    ABS. NEUTROPHILS 7.0 1.8 - 8.0 K/UL    ABS. LYMPHOCYTES 1.4 0.8 - 3.5 K/UL    ABS. MONOCYTES 1.1 (H) 0.0 - 1.0 K/UL    ABS. EOSINOPHILS 0.0 0.0 - 0.4 K/UL    ABS. BASOPHILS 0.0 0.0 - 0.1 K/UL   METABOLIC PANEL, COMPREHENSIVE    Collection Time: 12/19/15  3:22 PM   Result Value Ref Range    Sodium 134 (L) 136 - 145 mmol/L    Potassium 4.5 3.5 - 5.1 mmol/L    Chloride 102 97 - 108 mmol/L    CO2 25 21 - 32 mmol/L    Anion gap 7 5 - 15 mmol/L    Glucose 95 65 - 100 mg/dL    BUN 19 6 - 20 MG/DL    Creatinine 0.89 0.70 - 1.30 MG/DL     BUN/Creatinine ratio 21 (H) 12 - 20      GFR est AA >60 >60 ml/min/1.46m    GFR est non-AA >60 >60 ml/min/1.759m   Calcium 8.7 8.5 - 10.1 MG/DL    Bilirubin, total 0.9 0.2 - 1.0 MG/DL    ALT (SGPT) 19 12 - 78 U/L    AST (SGOT) 29 15 - 37 U/L    Alk. phosphatase 62 45 - 117 U/L    Protein, total 7.4 6.4 - 8.2 g/dL    Albumin 3.6 3.5 - 5.0 g/dL    Globulin 3.8 2.0 - 4.0 g/dL    A-G Ratio 0.9 (L) 1.1 - 2.2     PROTHROMBIN TIME + INR    Collection Time: 12/19/15  3:22 PM   Result Value Ref Range    INR 1.1 0.9 - 1.1      Prothrombin time 10.8 9.0 - 11.1 sec   URINALYSIS W/ RFLX MICROSCOPIC    Collection Time: 12/19/15  3:59 PM   Result Value Ref Range    Color YELLOW/STRAW      Appearance CLEAR CLEAR      Specific gravity 1.028 1.003 - 1.030      pH (UA) 7.0 5.0 - 8.0      Protein 100 (A) NEG mg/dL    Glucose NEGATIVE  NEG mg/dL    Ketone NEGATIVE  NEG mg/dL    Bilirubin NEGATIVE  NEG      Blood NEGATIVE  NEG      Urobilinogen 0.2 0.2 - 1.0 EU/dL    Nitrites NEGATIVE  NEG      Leukocyte Esterase NEGATIVE  NEG      WBC 0-4 0 - 4 /hpf    RBC 0-5 0 - 5 /hpf    Epithelial cells FEW FEW /lpf    Bacteria NEGATIVE  NEG /hpf    Hyaline cast 0-2 0 - 5 /lpf   URINE CULTURE HOLD SAMPLE    Collection Time: 12/19/15  3:59 PM   Result Value Ref Range    Urine culture hold URINE ON HOLD IN MICROBIOLOGY DEPT FOR 3 DAYS       Recent Labs      12/19/15   1522   WBC  9.5   HGB  12.8   HCT  38.0   PLT  264     Recent Labs      12/19/15   1522   NA  134*   K  4.5   CL  102   CO2  25   GLU  95   BUN  19   CREA  0.89   CA  8.7   ALB  3.6   SGOT  29   ALT  19   INR  1.1       Imaging  CT Results  (Last 48 hours)               12/19/15 1737  CT HEAD WO CONT Final result    Impression:  impression: No acute findings suspected..       Narrative:  EXAM:  CT HEAD WO CONT       INDICATION:   acute AMS       COMPARISON: None.       CONTRAST:  None.       TECHNIQUE: Unenhanced CT of the head was performed using 5 mm images. Brain and    bone windows were generated.  CT dose reduction was achieved through use of a   standardized protocol tailored for this examination and automatic exposure   control for dose modulation.         FINDINGS:   Patient cooperation is very limited. There is atrophy and white matter disease.   There is no definite evidence for masses, extra-axial fluid collection shift.    \\           CXR about to be done    Assessment and Plan   Principal Problem:    Acute encephalopathy (POA) on chronic dementia  - patient seen/examined in ED on 12/19/15; place in OBS neurotelemetry  - currently lethargic due to Haldol/Ativan received in ED  - unclear etiology although CMP indicative of mild dehydration and hyponatremia   - CT head wo contrast unremarkable  - concern for possible TIA given reported expressive aphasia per family today  - UA clear  - CXR pending  - gentle IVNS for now  - check MRI/MRA brain/neck r/o stroke/TIA if hip hardware is MRI compatible  - check TTE, carotid duplex, TSH, B12, ammonia  - PT/OT/SLP evals when awake/alert; family agreeable to IRF/SNF if indicated    Active Problems:  B/l L>R LE pitting edema (POA) - wife reports LE edema is at baseline  - check venous duplex r/o DVT  - hold Lasix for now      H/o falls - fall precautions  - check vitamin D level  - PT/OT evals      Mild dehydration and hyponatremia (POA)  - likely due to decreased fluid intake  - keep NPO for now, hold Lasix, and start gentle IVNS       CAD s/p CABGx6 - hold home meds ASA, statin until po intake resumed      Chronic a-fib - rate controlled on no AV nodal blockers per PTA med list  - hold ASA for now (warfarin stopped 4 weeks ago by Cardiologist in [place] due to recurrent falls)      Chronic hip dislocation - PT/OT evals; uses walker at home      Hard of hearing - family has patient's hearing aids      Ischemic cardiomyopathy/chronic systolic heart failure   - reported EF 20%   - unknown NHYA Class at this time due to patient's mental status  - check BNP and TTE  - CXR pending  - hold Lasix for now and monitor while on gentle IVF    Diet: NPO until awake and alert and SLP swallow eval  Activity: bedrest until PT/OT evals  DVT prophylaxis: heparin  SC q12h  Isolation precautions: none  Consultations: none at this time  Anticipated disposition: TBD.       Signed by: Dahlia Client, MD    December 19, 2015 at 6:02 PM

## 2015-12-19 NOTE — ED Notes (Signed)
Pt repeatedly stating that he has to urinate, attempting to get out of bed and confused about where he is, pt becoming increasingly agitated. Dr Dareen PianoAnderson aware

## 2015-12-19 NOTE — ED Notes (Signed)
Pt restless, attempting to get out of bed, not following commands, and speaking

## 2015-12-19 NOTE — ED Notes (Signed)
Pt off unit for testing with RN.

## 2015-12-19 NOTE — Other (Signed)
TRANSFER - OUT REPORT:    Verbal report given to Coralee Northina on [name]  being transferred to 677 for routine progression of care       Report consisted of patient???s Situation, Background, Assessment and   Recommendations(SBAR).     Information from the following report(s) SBAR was reviewed with the receiving nurse.    Lines:   Peripheral IV 12/19/15 Left Arm (Active)   Site Assessment Clean, dry, & intact 12/19/2015  8:11 PM   Phlebitis Assessment 0 12/19/2015  8:11 PM   Infiltration Assessment 0 12/19/2015  8:11 PM   Dressing Status Clean, dry, & intact 12/19/2015  8:11 PM        Opportunity for questions and clarification was provided.      Patient transported with:   [employer]

## 2015-12-19 NOTE — Other (Signed)
Primary Nurse Ernst SpellNina Lekwuwa, RN and Herbert SetaHeather, RN performed a dual skin assessment on this patient No impairment noted  Braden score is 18

## 2015-12-19 NOTE — ED Provider Notes (Signed)
HPI Comments: 82 y.o. male with past medical history significant for MI, CAD, a-fib, and dementia who presents accompanied by family via EMS to be evaluated for AMS.  The pt's family explains that the pt has been confused since yesterday. The family states that the pt is having difficulty finding words and he has not been able to follow simple commands. The family explains that he can slowly ambulate with a walker despite his chronically dislocated hip replacement, but he has not been able to ambulate well since yesterday. The pt's daughter says that the pt has been c/o nausea and a HA since yesterday. The pt's family reports that the pt fell by "sliding onto the ground" last night. The pt's son-in-law says that the pt has been getting weaker and more deaf for the past year, and they had the pt move to [place] from [place] 4 weeks ago given his recent falls. The family admits that the pt was taken off of his Coumadin for his A-fib a month ago and was started on 81 mg ASA given his fall risk. The pt's son reports that the pt had a 6 vessel CABG following his MI 14 years ago and he has an EF of 20%. Family denies a pt hx of cancer. There are no other acute medical concerns at this time.    Full history, physical exam, and ROS unable to be obtained due to:  confusion.     Social hx: Former smoker. Occasional ETOH use  PCP: Kassie Mends, MD    Note written by Andres Ege, Scribe, as dictated by Casimiro Needle, MD 3:38 PM     The history is provided by a relative. No language interpreter was used.        Past Medical History:   Diagnosis Date   ??? CAD (coronary artery disease)    ??? Ill-defined condition     MI 14 years ago    ??? Ill-defined condition     a fib       Past Surgical History:   Procedure Laterality Date   ??? HX ORTHOPAEDIC      left hip permantly dislocated         No family history on file.    Social History     Social History   ??? Marital status: MARRIED     Spouse name: N/A    ??? Number of children: N/A   ??? Years of education: N/A     Occupational History   ??? Not on file.     Social History Main Topics   ??? Smoking status: Not on file   ??? Smokeless tobacco: Not on file   ??? Alcohol use Not on file   ??? Drug use: Not on file   ??? Sexual activity: Not on file     Other Topics Concern   ??? Not on file     Social History Narrative   ??? No narrative on file         ALLERGIES: Codeine and Penicillins    Review of Systems   Unable to perform ROS: Mental status change (confusion)       Vitals:    12/19/15 1515   Weight: 86.6 kg (191 lb)   Height: 6\' 1"  (1.854 m)            Physical Exam   Constitutional: He appears well-developed and well-nourished. No distress.   HENT:   Head: Normocephalic and atraumatic.   Nose: Nose  normal.   Mouth/Throat: Oropharynx is clear and moist. No oropharyngeal exudate.   Eyes: Conjunctivae and EOM are normal. Right eye exhibits no discharge. Left eye exhibits no discharge. No scleral icterus.   Neck: Normal range of motion. Neck supple. No JVD present. No tracheal deviation present. No thyromegaly present.   Cardiovascular: Normal rate, regular rhythm, normal heart sounds and intact distal pulses.  Exam reveals no gallop and no friction rub.    No murmur heard.  Pulmonary/Chest: Effort normal and breath sounds normal. No stridor. No respiratory distress. He has no wheezes. He has no rales. He exhibits no tenderness.   Abdominal: Bowel sounds are normal. He exhibits no distension and no mass. There is no tenderness. There is no rebound.   Musculoskeletal: Normal range of motion. He exhibits no edema or tenderness.   Lymphadenopathy:     He has no cervical adenopathy.   Neurological: He is alert. No cranial nerve deficit. Coordination normal.   Skin: Skin is warm and dry. No rash noted. He is not diaphoretic. No erythema. No pallor.   Psychiatric: His mood appears anxious. His affect is angry. He is agitated, aggressive and combative. Cognition and memory are impaired. He  expresses inappropriate judgment. He exhibits abnormal recent memory and abnormal remote memory.   Nursing note and vitals reviewed.   Note written by Andres EgeLuke Yesbeck, Scribe, as dictated by Casimiro Needleustin W Zalmen Wrightsman, MD 3:38 PM     MDM  Number of Diagnoses or Management Options  Agitation:   Altered mental status, unspecified altered mental status type:      Amount and/or Complexity of Data Reviewed  Clinical lab tests: ordered and reviewed  Tests in the radiology section of CPT??: ordered and reviewed    Critical Care  Total time providing critical care: 30-74 minutes (30 minutes)    Patient Progress  Patient progress: stable    ED Course       Procedures         PROGRESS NOTE:  4:40 PM Given the pt's extremely agitated state, for himself and his family'd safety, the pt was given 5 mg Haldol IM    PROGRESS NOTE:  5:38 PM Pt was unable to lay still for the head CT. He started moving near the end of the scan. Small amount of artifact noted on images.    PROGRESS NOTE:  5:52 PM Spoke with the nurse. The pt's family says that the pt has been agitated and they are asking about further sedation. Will give 1 mg of Ativan.    CONSULT NOTE:  5:52 PM Casimiro Needleustin W Margurette Brener, MD spoke with Dr. Minus Libertyahman, Consult for Hospitalist.  Discussed available diagnostic tests and clinical findings.  He is in agreement with care plans as outlined.  Dr. Minus Libertyahman will see and admit pt for further evaluation of treatment.    4:42 PM  Patient is being admitted to the hospital.  The results of their tests and reasons for their admission have been discussed with them and/or available family.  They convey agreement and understanding for the need to be admitted and for their admission diagnosis.  Consultation has been made with the inpatient physician specialist for hospitalization.    LABORATORY TESTS:  Recent Results (from the past 12 hour(s))   TROPONIN I    Collection Time: 12/21/15  6:15 AM   Result Value Ref Range    Troponin-I, Qt. 0.47 (H) <0.05 ng/mL        IMAGING RESULTS:  XR CHEST PORT  Final Result      DUPLEX LOWER EXT VENOUS BILAT   Final Result      DUPLEX CAROTID BILATERAL   Final Result      XR CHEST PORT   Final Result      CT HEAD WO CONT   Final Result      MRI BRAIN WO CONT    (Results Pending)     Xr Chest Port    Result Date: 12/21/2015  EXAM:  XR CHEST PORT INDICATION:  Wheezing today. COMPARISON: Chest views on 12/19/2015 TECHNIQUE: Semiupright portable chest AP view FINDINGS: Cardiac monitoring wires overlie the thorax. Sternotomy wires and mediastinal clips are unchanged. Cardiomegaly is unchanged. Aortic arch is not enlarged. The pulmonary vasculature is within normal limits. Pulmonary edema has resolved. No pneumonia or pneumothorax. Bones are unchanged.     IMPRESSION: Improved lung aeration.       MEDICATIONS GIVEN:  Medications   sodium chloride (NS) flush 5-10 mL (10 mL IntraVENous Given 12/21/15 1401)   sodium chloride (NS) flush 5-10 mL (not administered)   ondansetron (ZOFRAN) injection 4 mg (not administered)   heparin (porcine) injection 5,000 Units (5,000 Units SubCUTAneous Given 12/21/15 0930)   0.9% sodium chloride infusion (not administered)   levoFLOXacin (LEVAQUIN) 250 mg in D5W IVPB (250 mg IntraVENous New Bag 12/21/15 1625)   aspirin delayed-release tablet 81 mg (81 mg Oral Given 12/21/15 1203)   haloperidol lactate (HALDOL) injection 5 mg (5 mg IntraMUSCular Given 12/19/15 1609)   LORazepam (ATIVAN) injection 1 mg (1 mg IntraVENous Given 12/19/15 1754)   haloperidol lactate (HALDOL) injection 5 mg (5 mg IntraVENous Given 12/19/15 2039)   haloperidol lactate (HALDOL) injection 5 mg (5 mg IntraVENous Given 12/19/15 2314)   perflutren lipid microspheres (DEFINITY) in NS bolus IV (1.5 mL IntraVENous Given 12/20/15 1014)   sodium chloride (NS) flush 20 mL (20 mL IntraVENous Given 12/20/15 1015)   magnesium sulfate 2 g/50 ml IVPB (premix or compounded) (2 g IntraVENous New Bag 12/20/15 1829)    furosemide (LASIX) injection 40 mg (40 mg IntraVENous Given 12/21/15 0110)       IMPRESSION:  1. Agitation    2. Altered mental status, unspecified altered mental status type        PLAN:  1. Admit to Hospitalist    Total critical care time spent exclusive of procedures:  30 minutes      Casimiro Needleustin W Adalaya Irion, MD

## 2015-12-19 NOTE — ED Triage Notes (Signed)
Pt presents from home with altered mental status that began yesterday and became very pronounced today.

## 2015-12-19 NOTE — Progress Notes (Signed)
Patient agitation and combative behavior continues, pulling off telemetry leads and attempting to pull out IV. Dr. Ancil LinseyBomar made aware. New order for soft wrist restraints; Haldol 5mg  IV once.

## 2015-12-20 ENCOUNTER — Observation Stay: Payer: MEDICARE | Primary: Family Medicine

## 2015-12-20 LAB — EKG 12-LEAD
Atrial Rate: 84 {beats}/min
Q-T Interval: 366 ms
QRS Duration: 120 ms
QTc Calculation (Bazett): 455 ms
R Axis: -78 degrees
T Axis: 106 degrees
Ventricular Rate: 93 {beats}/min

## 2015-12-20 LAB — RPR
RPR: NONREACTIVE
RPR: NONREACTIVE

## 2015-12-20 LAB — ECHOCARDIOGRAM COMPLETE 2D W DOPPLER W COLOR: Left Ventricular Ejection Fraction: 23

## 2015-12-20 LAB — CBC WITH AUTOMATED DIFF
ABS. BASOPHILS: 0 10*3/uL (ref 0.0–0.1)
ABS. EOSINOPHILS: 0 10*3/uL (ref 0.0–0.4)
ABS. LYMPHOCYTES: 1.1 10*3/uL (ref 0.8–3.5)
ABS. MONOCYTES: 1 10*3/uL (ref 0.0–1.0)
ABS. NEUTROPHILS: 6.3 10*3/uL (ref 1.8–8.0)
BASOPHILS: 0 % (ref 0–1)
EOSINOPHILS: 0 % (ref 0–7)
HCT: 36.8 % (ref 36.6–50.3)
HGB: 12.4 g/dL (ref 12.1–17.0)
LYMPHOCYTES: 13 % (ref 12–49)
MCH: 31.2 PG (ref 26.0–34.0)
MCHC: 33.7 g/dL (ref 30.0–36.5)
MCV: 92.5 FL (ref 80.0–99.0)
MONOCYTES: 12 % (ref 5–13)
NEUTROPHILS: 75 % (ref 32–75)
PLATELET: 247 10*3/uL (ref 150–400)
RBC: 3.98 M/uL — ABNORMAL LOW (ref 4.10–5.70)
RDW: 12.8 % (ref 11.5–14.5)
WBC: 8.5 10*3/uL (ref 4.1–11.1)

## 2015-12-20 LAB — METABOLIC PANEL, COMPREHENSIVE
A-G Ratio: 1.1 (ref 1.1–2.2)
ALT (SGPT): 21 U/L (ref 12–78)
AST (SGOT): 46 U/L — ABNORMAL HIGH (ref 15–37)
Albumin: 3.7 g/dL (ref 3.5–5.0)
Alk. phosphatase: 61 U/L (ref 45–117)
Anion gap: 10 mmol/L (ref 5–15)
BUN/Creatinine ratio: 21 — ABNORMAL HIGH (ref 12–20)
BUN: 18 MG/DL (ref 6–20)
Bilirubin, total: 1.6 MG/DL — ABNORMAL HIGH (ref 0.2–1.0)
CO2: 26 mmol/L (ref 21–32)
Calcium: 8.7 MG/DL (ref 8.5–10.1)
Chloride: 97 mmol/L (ref 97–108)
Creatinine: 0.87 MG/DL (ref 0.70–1.30)
GFR est AA: >60 mL/min/{1.73_m2} (ref >60)
GFR est non-AA: >60 mL/min/{1.73_m2} (ref >60)
Globulin: 3.5 g/dL (ref 2.0–4.0)
Glucose: 91 mg/dL (ref 65–100)
Potassium: 3.8 mmol/L (ref 3.5–5.1)
Protein, total: 7.2 g/dL (ref 6.4–8.2)
Sodium: 133 mmol/L — ABNORMAL LOW (ref 136–145)

## 2015-12-20 LAB — EKG, 12 LEAD, INITIAL
Atrial Rate: 84 {beats}/min
Calculated R Axis: -78 degrees
Calculated T Axis: 106 degrees
Q-T Interval: 366 ms
QRS Duration: 120 ms
QTC Calculation (Bezet): 455 ms
Ventricular Rate: 93 {beats}/min

## 2015-12-20 LAB — VITAMIN B12: Vitamin B12: 324 pg/mL (ref 211–911)

## 2015-12-20 LAB — FOLATE: Folate: 25.9 ng/mL — ABNORMAL HIGH (ref 5.0–21.0)

## 2015-12-20 LAB — TSH 3RD GENERATION: TSH: 1.84 u[IU]/mL (ref 0.36–3.74)

## 2015-12-20 LAB — NT-PRO BNP: NT pro-BNP: 4760 PG/ML — ABNORMAL HIGH (ref 0–450)

## 2015-12-20 LAB — AMMONIA: Ammonia, plasma: 29 umol/L (ref <32)

## 2015-12-20 LAB — VITAMIN D, 25 HYDROXY: Vitamin D 25-Hydroxy: 40.3 ng/mL (ref 30–100)

## 2015-12-20 LAB — TROPONIN I
Troponin-I, Qt.: 0.1 ng/mL — ABNORMAL HIGH (ref <0.05)
Troponin-I, Qt.: 0.36 ng/mL — ABNORMAL HIGH (ref <0.05)

## 2015-12-20 LAB — MAGNESIUM: Magnesium: 1.5 mg/dL — ABNORMAL LOW (ref 1.6–2.4)

## 2015-12-20 MED ORDER — ASPIRIN 81 MG TAB, DELAYED RELEASE
81 mg | Freq: Every day | ORAL | Status: DC
Start: 2015-12-20 — End: 2015-12-25
  Administered 2015-12-21 – 2015-12-25 (×5): via ORAL

## 2015-12-20 MED ORDER — HALOPERIDOL LACTATE 5 MG/ML IJ SOLN
5 mg/mL | INTRAMUSCULAR | Status: AC
Start: 2015-12-20 — End: 2015-12-19
  Administered 2015-12-20: 02:00:00 via INTRAVENOUS

## 2015-12-20 MED ORDER — SODIUM CHLORIDE 0.9 % IJ SYRG
Freq: Three times a day (TID) | INTRAMUSCULAR | Status: DC
Start: 2015-12-20 — End: 2015-12-25
  Administered 2015-12-20 – 2015-12-24 (×15): via INTRAVENOUS

## 2015-12-20 MED ORDER — ONDANSETRON (PF) 4 MG/2 ML INJECTION
4 mg/2 mL | INTRAMUSCULAR | Status: DC | PRN
Start: 2015-12-20 — End: 2015-12-25

## 2015-12-20 MED ORDER — HALOPERIDOL LACTATE 5 MG/ML IJ SOLN
5 mg/mL | Freq: Once | INTRAMUSCULAR | Status: AC
Start: 2015-12-20 — End: 2015-12-19
  Administered 2015-12-20: 04:00:00 via INTRAVENOUS

## 2015-12-20 MED ORDER — MAGNESIUM SULFATE 2 GRAM/50 ML IVPB
2 gram/50 mL (4 %) | Freq: Once | INTRAVENOUS | Status: AC
Start: 2015-12-20 — End: 2015-12-20
  Administered 2015-12-20: 23:00:00 via INTRAVENOUS

## 2015-12-20 MED ORDER — HEPARIN (PORCINE) 5,000 UNIT/ML IJ SOLN
5000 unit/mL | Freq: Two times a day (BID) | INTRAMUSCULAR | Status: DC
Start: 2015-12-20 — End: 2015-12-25
  Administered 2015-12-20 – 2015-12-25 (×12): via SUBCUTANEOUS

## 2015-12-20 MED ORDER — SODIUM CHLORIDE 0.9 % IJ SYRG
Freq: Once | INTRAMUSCULAR | Status: AC
Start: 2015-12-20 — End: 2015-12-20
  Administered 2015-12-20: 15:00:00 via INTRAVENOUS

## 2015-12-20 MED ORDER — SODIUM CHLORIDE 0.9 % INJECTION
1.1 mg/mL | Freq: Once | INTRAMUSCULAR | Status: AC
Start: 2015-12-20 — End: 2015-12-20
  Administered 2015-12-20: 15:00:00 via INTRAVENOUS

## 2015-12-20 MED ORDER — LEVOFLOXACIN IN D5W 250 MG/50 ML IV PIGGY BACK
250 mg/50 mL | INTRAVENOUS | Status: DC
Start: 2015-12-20 — End: 2015-12-23
  Administered 2015-12-20 – 2015-12-23 (×4): via INTRAVENOUS

## 2015-12-20 MED ORDER — SODIUM CHLORIDE 0.9 % IJ SYRG
INTRAMUSCULAR | Status: DC | PRN
Start: 2015-12-20 — End: 2015-12-25

## 2015-12-20 MED FILL — DEFINITY 1.1 MG/ML INTRAVENOUS SUSPENSION: 1.1 mg/mL | INTRAVENOUS | Qty: 2

## 2015-12-20 MED FILL — MONOJECT PREFILL ADVANCED 0.9 % SODIUM CHLORIDE INJECTION SYRINGE: INTRAMUSCULAR | Qty: 10

## 2015-12-20 MED FILL — HALOPERIDOL LACTATE 5 MG/ML IJ SOLN: 5 mg/mL | INTRAMUSCULAR | Qty: 1

## 2015-12-20 MED FILL — LEVOFLOXACIN IN D5W 250 MG/50 ML IV PIGGY BACK: 250 mg/50 mL | INTRAVENOUS | Qty: 50

## 2015-12-20 MED FILL — MAGNESIUM SULFATE 2 GRAM/50 ML IVPB: 2 gram/50 mL (4 %) | INTRAVENOUS | Qty: 50

## 2015-12-20 MED FILL — NORMAL SALINE FLUSH 0.9 % INJECTION SYRINGE: INTRAMUSCULAR | Qty: 20

## 2015-12-20 MED FILL — HEPARIN (PORCINE) 5,000 UNIT/ML IJ SOLN: 5000 unit/mL | INTRAMUSCULAR | Qty: 1

## 2015-12-20 MED FILL — NORMAL SALINE FLUSH 0.9 % INJECTION SYRINGE: INTRAMUSCULAR | Qty: 30

## 2015-12-20 NOTE — Procedures (Signed)
[facility]  *** FINAL REPORT ***    Name: [name] [name], [name] [name]  MRN: [id number]    Outpatient  DOB: 24 Mar 1920  HIS Order #: [id number]  TRAKnet Visit #: [id number]  Date: 20 Dec 2015    TYPE OF TEST: Peripheral Venous Testing    REASON FOR TEST  Pain in limb, Limb swelling    Right Leg:-  Deep venous thrombosis:           No  Superficial venous thrombosis:    No  Deep venous insufficiency:        Not examined  Superficial venous insufficiency: Not examined    Left Leg:-  Deep venous thrombosis:           No  Superficial venous thrombosis:    No  Deep venous insufficiency:        Not examined  Superficial venous insufficiency: Not examined      INTERPRETATION/FINDINGS  PROCEDURE:  Color duplex ultrasound imaging of lower extremity veins.    FINDINGS:       Right: The common femoral, deep femoral, femoral, popliteal,  posterior tibial, peroneal, and great saphenous are patent and without   evidence of thrombus;  each is fully compressible and there is no  narrowing of the flow channel on color Doppler imaging.  Phasic flow  is observed in the common femoral vein.       Left:   The common femoral, deep femoral, femoral, popliteal,  posterior tibial, peroneal, and great saphenous are patent and without   evidence of thrombus;  each is fully compressible and there is no  narrowing of the flow channel on color Doppler imaging.  Phasic flow  is observed in the common femoral vein.    IMPRESSION:  No evidence of right or left lower extremity vein  thrombosis.    ADDITIONAL COMMENTS    I have personally reviewed the data relevant to the interpretation of  this  study.    TECHNOLOGIST: Julie A. Toone, RVS  Signed: 12/20/2015 11:49 AM    PHYSICIAN: Gordy Goar, MD  Signed: 12/20/2015 02:47 PM

## 2015-12-20 NOTE — Progress Notes (Signed)
Occupational Therapy Note 107    Orders acknowledged, chart reviewed. Patient currently OTF. Will continue to follow as appropriate and able. Thank you.    Richardo PriestJennifer Monks, OTR/L

## 2015-12-20 NOTE — Progress Notes (Signed)
Speech Pathology bedside swallow evaluation/discharge  Patient: [name] [name] (11(82 y.o. male)  Date: 12/20/2015  Primary Diagnosis: Acute encephalopathy        Precautions: aspiration, fall        ASSESSMENT :  Based on the objective data described below, the patient presents with no oral or pharyngeal dysphagia, however, decreased recognition of PO items related to mental status.  Patient with most efficient acceptance when he participated in self-feeding.  Timely and complete mastication, timely swallow initiation and functional hyolaryngeal elevation/excursion via palpation.  No s/s of aspiration even with large successive swallows of thins.  Mental status will be primary limiting factor in PO intake and intake may be limited depending on recognition of PO items throughout the day.    Skilled therapy provided by a speech-language pathologist is not indicated at this time.     PLAN :  Recommendations:  --continue regular diet.  Assistance as needed with verbal/tactile cues to aid in recognition of PO items during meals.  Hold during periods of increased confusion.    Discharge Recommendations: None     SUBJECTIVE:   Patient alert, confused.  Making spitting sounds (but not actually spitting) throughout entire session.     OBJECTIVE:     Past Medical History:   Diagnosis Date   ??? CAD (coronary artery disease)    ??? Ill-defined condition     MI 14 years ago    ??? Ill-defined condition     a fib     Past Surgical History:   Procedure Laterality Date   ??? HX ORTHOPAEDIC      left hip permantly dislocated     Prior Level of Function/Home Situation:   Home Situation  Home Environment: Private residence  One/Two Story Residence: One story  Living Alone: No  Support Systems: Family member(s)  Patient Expects to be Discharged to:: Rehabilitation facility  Current DME Used/Available at Home: Paediatric nursehower chair, Grab bars  Diet prior to admission: regular   Current Diet:  Regular    Cognitive and Communication Status:   Neurologic State: Alert, Confused  Orientation Level: Unable to verbalize  Cognition: Decreased command following, Decreased attention/concentration, Memory loss, Impulsive  Perception: Appears intact  Perseveration: Perseverates during ADLS  Safety/Judgement: Not assessed  Oral Assessment:  Oral Assessment  Labial: Other (comment) (did not follow commands to assess)  Dentition: Natural  Oral Hygiene: moist mucosa   P.O. Trials:  Patient Position: upright in bed   Vocal quality prior to P.O.: No impairment  Consistency Presented: Thin liquid;Solid  How Presented: SLP-fed/presented;Self-fed/presented;Straw;Successive swallows     Bolus Acceptance: Other (comment) (verbal and tactile cues to recognize food items)  Bolus Formation/Control: No impairment     Propulsion: No impairment  Oral Residue: None  Initiation of Swallow: No impairment  Laryngeal Elevation: Functional  Aspiration Signs/Symptoms: None  Pharyngeal Phase Characteristics: No impairment, issues, or problems   Effective Modifications:  (improved recognition of food items when self-feeding)          Oral Phase Severity: No impairment  Pharyngeal Phase Severity : No impairment      G Codes:  In compliance with CMS???s Claims Based Outcome Reporting, the following G-code set was chosen for this patient based the use of the NOMS functional outcome to quantify this patient's level of swallowing impairment.    Using the NOMS, the patient was determined to be at level 7 for swallow function which correlates with the CH= 0% level of severity.    Based  on the objective assessment provided within this note, the current, goal, and discharge g-codes are as follows:    Swallow  Swallowing:  G8996 Swallow Current Status CH= 0%  G8997 Swallow Goal Status CH= 0%  G8998 Swallow D/C Status CH= 0%        NOMS Swallowing Levels:  Level 1 (CN): NPO  Level 2 (CM): NPO but takes consistency in therapy  Level 3 (CL): Takes less than 50% of nutrition p.o. and continues with  nonoral feedings; and/or safe with mod cues; and/or max diet restriction  Level 4 (CK): Safe swallow but needs mod cues; and/or mod diet restriction; and/or still requires some nonoral feeding/supplements  Level 5 (CJ): Safe swallow with min diet restriction; and/or needs min cues  Level 6 (CI): Independent with p.o.; rare cues; usually self cues; may need to avoid some foods or needs extra time  Level 7 (CH): Independent for all p.o.  ASHA. (2003). National Outcomes Measurement System (NOMS): Adult Speech-Language Pathology User's Guide.       Pain:Pain Scale 1: Numeric (0 - 10)  Pain Intensity 1: 0     After treatment:    Patient left in no apparent distress sitting up in chair   Patient left in no apparent distress in bed   Call bell left within reach   Nursing notified   Caregiver present   Bed alarm activated    COMMUNICATION/EDUCATION:   The patient???s plan of care including findings, recommendations, and recommended diet changes were discussed with: Registered Nurse.  Patient was educated regarding His functional swallow as this relates to His diagnosis of neuro workup.  He demonstrated Poor understanding as evidenced by no response to education.     Patient/family have participated as able and agree with findings and recommendations.   Patient is unable to participate in plan of care at this time.    Thank you for this referral.  Benn MoulderAmanda Smith, M.CD. CCC-SLP   Time Calculation: 15 mins

## 2015-12-20 NOTE — Progress Notes (Signed)
Consult received and appreciated.  Attempted to see patient for swallowing evaluation, however, currently off the floor for testing.  Will follow up this pm as patient available.  Thanks.      Benn MoulderAmanda Smith, M.CD. CCC-SLP

## 2015-12-20 NOTE — Progress Notes (Signed)
Problem: Falls - Risk of  Goal: *Absence of Falls  Document Schmid Fall Risk and appropriate interventions in the flowsheet.   Outcome: Progressing Towards Goal  Fall Risk Interventions:  Mobility Interventions: Bed/chair exit alarm, OT consult for ADLs, Patient to call before getting OOB, PT Consult for mobility concerns, Utilize walker, cane, or other assitive device, Utilize gait belt for transfers/ambulation    Mentation Interventions: Adequate sleep, hydration, pain control, Bed/chair exit alarm, Door open when patient unattended, Family/sitter at bedside, Increase mobility, More frequent rounding, Room close to nurse's station, Update white board    Medication Interventions: Bed/chair exit alarm, Patient to call before getting OOB, Teach patient to arise slowly    Elimination Interventions: Bed/chair exit alarm, Call light in reach, Toileting schedule/hourly rounds    History of Falls Interventions: Bed/chair exit alarm, Door open when patient unattended, Room close to nurse's station

## 2015-12-20 NOTE — Procedures (Signed)
[facility]  *** FINAL REPORT ***    Name: Elvina SidleWIMBEERLEY, VERNON  MRN: [id number]    Outpatient  DOB: 24 Mar 1920  HIS Order #: [id number]  TRAKnet Visit #: [id number]  Date: 20 Dec 2015    TYPE OF TEST: Cerebrovascular Duplex    REASON FOR TEST  Acute encephalopathy    Right Carotid:-             Proximal               Mid                 Distal  cm/s  Systolic  Diastolic  Systolic  Diastolic  Systolic  Diastolic  CCA:     08.637.0       5.0                            37.0       6.0  Bulb:  ECA:     43.0       0.0  ICA:     52.0       6.0                            45.0      10.0  ICA/CCA:  1.4       1.0    ICA Stenosis:    Right Vertebral:-  Finding: Antegrade  Sys:       40.0  Dia:        9.0    Right Subclavian:    Left Carotid:-            Proximal                Mid                 Distal  cm/s  Systolic  Diastolic  Systolic  Diastolic  Systolic  Diastolic  CCA:     57.845.0       9.0                            61.0       9.0  Bulb:  ECA:     66.0       0.0  ICA:     40.0       6.0                            50.0      12.0  ICA/CCA:  0.7       0.7    ICA Stenosis:    Left Vertebral:-  Finding: Antegrade  Sys:       46.0  Dia:        9.0    Left Subclavian:    INTERPRETATION/FINDINGS  PROCEDURE:  Color duplex ultrasound imaging of extracranial  cerebrovascular arteries.    FINDINGS:       Right:  Internal carotid velocity is within normal limits.  There   is narrowing of the internal carotid flow channel on color Doppler  imaging and non-calcific plaque on B-mode imaging, consistent with  less than 50 percent stenosis (lower portion of the 0 to 49 percent  range).  The common and external carotid arteries are patent and  without evidence of hemodynamically significant  stenosis.       Left: Internal carotid velocity is within normal limits.  There  is narrowing of the internal carotid flow channel on color Doppler  imaging and non-calcific plaque on B-mode imaging, consistent with   less than 50 percent stenosis (lower portion of the 0 to 49 percent  range).  The common and external carotid arteries are patent and  without evidence of hemodynamically significant stenosis.    IMPRESSION:  Consistent with less than 50% stenosis of the right  internal carotid and less than 50% stenosis of the left internal  carotid.  Vertebrals are patent with antegrade flow.    ADDITIONAL COMMENTS    I have personally reviewed the data relevant to the interpretation of  this  study.    TECHNOLOGIST: Zenaida DeedJulie A. Donette Larryoone, [place]  Signed: 12/20/2015 12:53 PM    PHYSICIAN: Gaetana MichaelisGregg Deitrich Steve, MD  Signed: 12/20/2015 02:46 PM

## 2015-12-20 NOTE — Progress Notes (Addendum)
Hospitalist Progress Note  Roderic ScarceNelson E Aryanne Gilleland, MD  Answering service: [phone] OR [phone] from in house phone  Cell: [phone]      Date of Service:  12/20/2015  NAME:  [name] [name]  DOB:  October 02, 1920  MRN:  [id number]      Admission Summary:     Patient is a 82 year old male with past medical history of CAD s/p CABG, cardiomyopathy with EF of 20%, chronic atrial fibrillation, dementia, chronic hip dislocation, very hard on hearing who presented to the ER   Via EMS with the complaint of confusion, garbled speech and generalized body weakness.    Interval history / Subjective:      F/u for altered mental status.    Patient is very hard on hearing.  History was unobtainable    Patient is awake and calm.      Assessment & Plan:     Altered mental status likely secondary to metabolic encephalopathy vs acute stroke.  This is more likely due to metabolic encephalopathy in the setting of dementia  Initial CT head was negative. Carotid dopplers unremarkable  Echo: EF of 20- 25% with severe diffuse hypokinesis with distinct regional wall motion abnormalities.  MRI not yet done due to combativeness of patient.  - Put on his hearing aids to in order to reduce sensory impairments.  - Continue supportive care for now  - Avoid benzodiazepine's and benadryl    Chronic Ischemic cardiomyopathy: NYHA class unable to be determined  Echo with EF 20-25%  Hold Lasix.  Fluid restriction < 1800 cc/ day.  Continue aspirin, mag-oxide and zocor  Daily weight    Dementia with acute behavioral disturbance  Supportive care/one to one observation      Hyponatremia: Fluid restriction to 1800 cc/day  monitor    Possible left lower lobe PNA: As evidenced by small left lower lobe infiltrate on CXR  Start empiric Levaquin renally dosed   No fever or leucocytosis    CAD s/p CABG x 6.  Resume home aspirin/statin    Chronic atrial fibrillation; Now rate controlled  Continue aspirin.     Elevated troponin: obtain 2 more set  EKG afib with non specific T wave abnormalities    Hypomagnesemia: replete with magnesium sulfate  Keep mag > 2 and K > 4    Code status: DNR  DVT prophylaxis: Lovenox    Care Plan discussed with: Patient/Family and Nurse  Disposition: TBD     Hospital Problems  Date Reviewed: 12/19/2015          Codes Class Noted POA    * (Principal)Acute encephalopathy ICD-10-CM: G93.40  ICD-9-CM: 348.30  12/19/2015 Unknown        Dementia (Chronic) ICD-10-CM: F03.90  ICD-9-CM: 294.20  12/19/2015 Yes        Mild dehydration ICD-10-CM: E86.0  ICD-9-CM: 276.51  12/19/2015 Yes        CAD (coronary artery disease) (Chronic) ICD-10-CM: I25.10  ICD-9-CM: 414.00  12/19/2015 Yes        Chronic a-fib (HCC) (Chronic) ICD-10-CM: I48.2  ICD-9-CM: 427.31  12/19/2015 Yes        Dislocation, hip (HCC) (Chronic) ICD-10-CM: Y60.630ZS73.006A  ICD-9-CM: 835.00  12/19/2015 Unknown        Hard of hearing (Chronic) ICD-10-CM: H91.90  ICD-9-CM: 389.9  12/19/2015 Yes        Cardiomyopathy (HCC) (Chronic) ICD-10-CM: I42.9  ICD-9-CM: 425.4  12/19/2015 Unknown  Review of Systems:   Pertinent items are noted in HPI.       Vital Signs:    Last 24hrs VS reviewed since prior progress note. Most recent are:  Visit Vitals   ??? BP 144/78 (BP 1 Location: Right arm, BP Patient Position: At rest)   ??? Pulse 73   ??? Temp 97.5 ??F (36.4 ??C)   ??? Resp 20   ??? Ht 6\' 1"  (1.854 m)   ??? Wt 89 kg (196 lb 1.6 oz)   ??? SpO2 100%   ??? BMI 25.87 kg/m2       No intake or output data in the 24 hours ending 12/20/15 1537     Physical Examination:             Constitutional:  No acute distress, cooperative, pleasant. Very hard on hearing. Elderly   ENT:  Oral mucous moist, oropharynx benign. Neck supple,    Resp:  CTA bilaterally. No wheezing/rhonchi/rales. No accessory muscle use   CV:  Irregularly irregular no murmurs, gallops, rubs    GI:  Soft, non distended, non tender. normoactive bowel sounds, no hepatosplenomegaly      Musculoskeletal:  No edema, warm, 2+ pulses throughout    Neurologic:  Moves all extremities.  Awake. Orientation could not be assessed.            Data Review:          Labs:     Recent Labs      12/20/15   0620  12/19/15   1522   WBC  8.5  9.5   HGB  12.4  12.8   HCT  36.8  38.0   PLT  247  264     Recent Labs      12/20/15   0620  12/19/15   1522   NA  133*  134*   K  3.8  4.5   CL  97  102   CO2  26  25   BUN  18  19   CREA  0.87  0.89   GLU  91  95   CA  8.7  8.7   MG  1.5*   --      Recent Labs      12/20/15   0620  12/19/15   1522   SGOT  46*  29   ALT  21  19   AP  61  62   TBILI  1.6*  0.9   TP  7.2  7.4   ALB  3.7  3.6   GLOB  3.5  3.8     Recent Labs      12/19/15   1522   INR  1.1   PTP  10.8      No results for input(s): FE, TIBC, PSAT, FERR in the last 72 hours.   Lab Results   Component Value Date/Time    Folate 25.9 12/19/2015 03:22 PM      No results for input(s): PH, PCO2, PO2 in the last 72 hours.  Recent Labs      12/20/15   0620   TROIQ  0.10*     No results found for: CHOL, CHOLX, CHLST, CHOLV, HDL, LDL, LDLC, DLDLP, TGLX, TRIGL, TRIGP, CHHD, CHHDX  Lab Results   Component Value Date/Time    Glucose (POC) 96 12/19/2015 03:17 PM     Lab Results   Component Value Date/Time    Color YELLOW/STRAW 12/19/2015 03:59 PM  Appearance CLEAR 12/19/2015 03:59 PM    Specific gravity 1.028 12/19/2015 03:59 PM    pH (UA) 7.0 12/19/2015 03:59 PM    Protein 100 12/19/2015 03:59 PM    Glucose NEGATIVE  12/19/2015 03:59 PM    Ketone NEGATIVE  12/19/2015 03:59 PM    Bilirubin NEGATIVE  12/19/2015 03:59 PM    Urobilinogen 0.2 12/19/2015 03:59 PM    Nitrites NEGATIVE  12/19/2015 03:59 PM    Leukocyte Esterase NEGATIVE  12/19/2015 03:59 PM    Epithelial cells FEW 12/19/2015 03:59 PM    Bacteria NEGATIVE  12/19/2015 03:59 PM    WBC 0-4 12/19/2015 03:59 PM    RBC 0-5 12/19/2015 03:59 PM         Medications Reviewed:     Current Facility-Administered Medications   Medication Dose Route Frequency    ??? sodium chloride (NS) flush 5-10 mL  5-10 mL IntraVENous Q8H   ??? sodium chloride (NS) flush 5-10 mL  5-10 mL IntraVENous PRN   ??? ondansetron (ZOFRAN) injection 4 mg  4 mg IntraVENous Q4H PRN   ??? heparin (porcine) injection 5,000 Units  5,000 Units SubCUTAneous Q12H     ______________________________________________________________________  EXPECTED LENGTH OF STAY: - - -  ACTUAL LENGTH OF STAY:          0                 Roderic ScarceNelson E Vanessa Kampf, MD

## 2015-12-20 NOTE — Progress Notes (Signed)
Bedside and Verbal shift change report given to Mary, RN (oncoming nurse) by Missy, RN (offgoing nurse). Report included the following information SBAR, Kardex, Procedure Summary, Intake/Output, MAR, Recent Results and Cardiac Rhythm Afib.

## 2015-12-20 NOTE — Progress Notes (Signed)
Echo complete. Results to follow

## 2015-12-20 NOTE — Progress Notes (Signed)
Spoke with Dr. Gala LewandowskyAirewele to address patient's decreased mental status, low BP 96/62, and low HR 47 when at rest. MD will be in to see patient.

## 2015-12-20 NOTE — Progress Notes (Signed)
Physical Therapy Note  12/11  Orders acknowledged, chart reviewed. Patient currently OTF. Note patient is demonstrating combative behavior and confused. Sitter in room.  Will continue to follow and evaluate when appropriate and able.    Megan L. Blickenstaff, PT, DPT    ??

## 2015-12-20 NOTE — Procedures (Signed)
[facility]  *** FINAL REPORT ***    Name: [name] [name], [name] [name]  MRN: [id number]    Outpatient  DOB: 24 Mar 1920  HIS Order #: [id number]  TRAKnet Visit #: [id number]  Date: 20 Dec 2015    TYPE OF TEST: Peripheral Venous Testing    REASON FOR TEST  Pain in limb, Limb swelling    Right Leg:-  Deep venous thrombosis:           No  Superficial venous thrombosis:    No  Deep venous insufficiency:        Not examined  Superficial venous insufficiency: Not examined    Left Leg:-  Deep venous thrombosis:           No  Superficial venous thrombosis:    No  Deep venous insufficiency:        Not examined  Superficial venous insufficiency: Not examined      INTERPRETATION/FINDINGS  PROCEDURE:  Color duplex ultrasound imaging of lower extremity veins.    FINDINGS:       Right: The common femoral, deep femoral, femoral, popliteal,  posterior tibial, peroneal, and great saphenous are patent and without   evidence of thrombus;  each is fully compressible and there is no  narrowing of the flow channel on color Doppler imaging.  Phasic flow  is observed in the common femoral vein.       Left:   The common femoral, deep femoral, femoral, popliteal,  posterior tibial, peroneal, and great saphenous are patent and without   evidence of thrombus;  each is fully compressible and there is no  narrowing of the flow channel on color Doppler imaging.  Phasic flow  is observed in the common femoral vein.    IMPRESSION:  No evidence of right or left lower extremity vein  thrombosis.    ADDITIONAL COMMENTS    I have personally reviewed the data relevant to the interpretation of  this  study.    TECHNOLOGIST: Zenaida DeedJulie A. Donette Larryoone, RVS  Signed: 12/20/2015 11:49 AM    PHYSICIAN: Gaetana MichaelisGregg Javonne Dorko, MD  Signed: 12/20/2015 02:47 PM

## 2015-12-20 NOTE — Procedures (Signed)
[facility]  *** FINAL REPORT ***    Name: [name] SidleWIMBEERLEY, VERNON  MRN: [id number]    Outpatient  DOB: 24 Mar 1920  HIS Order #: [id number]  TRAKnet Visit #: [id number]  Date: 20 Dec 2015    TYPE OF TEST: Cerebrovascular Duplex    REASON FOR TEST  Acute encephalopathy    Right Carotid:-             Proximal               Mid                 Distal  cm/s  Systolic  Diastolic  Systolic  Diastolic  Systolic  Diastolic  CCA:     47.437.0       5.0                            37.0       6.0  Bulb:  ECA:     43.0       0.0  ICA:     52.0       6.0                            45.0      10.0  ICA/CCA:  1.4       1.0    ICA Stenosis:    Right Vertebral:-  Finding: Antegrade  Sys:       40.0  Dia:        9.0    Right Subclavian:    Left Carotid:-            Proximal                Mid                 Distal  cm/s  Systolic  Diastolic  Systolic  Diastolic  Systolic  Diastolic  CCA:     25.945.0       9.0                            61.0       9.0  Bulb:  ECA:     66.0       0.0  ICA:     40.0       6.0                            50.0      12.0  ICA/CCA:  0.7       0.7    ICA Stenosis:    Left Vertebral:-  Finding: Antegrade  Sys:       46.0  Dia:        9.0    Left Subclavian:    INTERPRETATION/FINDINGS  PROCEDURE:  Color duplex ultrasound imaging of extracranial  cerebrovascular arteries.    FINDINGS:       Right:  Internal carotid velocity is within normal limits.  There   is narrowing of the internal carotid flow channel on color Doppler  imaging and non-calcific plaque on B-mode imaging, consistent with  less than 50 percent stenosis (lower portion of the 0 to 49 percent  range).  The common and external carotid arteries are patent and  without evidence of hemodynamically significant  stenosis.       Left: Internal carotid velocity is within normal limits.  There  is narrowing of the internal carotid flow channel on color Doppler  imaging and non-calcific plaque on B-mode imaging, consistent with  less than 50 percent stenosis  (lower portion of the 0 to 49 percent  range).  The common and external carotid arteries are patent and  without evidence of hemodynamically significant stenosis.    IMPRESSION:  Consistent with less than 50% stenosis of the right  internal carotid and less than 50% stenosis of the left internal  carotid.  Vertebrals are patent with antegrade flow.    ADDITIONAL COMMENTS    I have personally reviewed the data relevant to the interpretation of  this  study.    TECHNOLOGIST: Zenaida DeedJulie A. Donette Larryoone, [place]  Signed: 12/20/2015 12:53 PM    PHYSICIAN: Gaetana MichaelisGregg Icess Bertoni, MD  Signed: 12/20/2015 02:46 PM

## 2015-12-21 ENCOUNTER — Observation Stay: Admit: 2015-12-21 | Payer: MEDICARE | Primary: Family Medicine

## 2015-12-21 LAB — TROPONIN I
Troponin-I, Qt.: 0.54 ng/mL — ABNORMAL HIGH (ref <0.05)
Troponin-I, Qt.: 0.47 ng/mL — ABNORMAL HIGH (ref <0.05)

## 2015-12-21 MED ORDER — FUROSEMIDE 10 MG/ML IJ SOLN
10 mg/mL | Freq: Once | INTRAMUSCULAR | Status: AC
Start: 2015-12-21 — End: 2015-12-21
  Administered 2015-12-21: 06:00:00 via INTRAVENOUS

## 2015-12-21 MED FILL — ASPIRIN 81 MG TAB, DELAYED RELEASE: 81 mg | ORAL | Qty: 1

## 2015-12-21 MED FILL — HEPARIN (PORCINE) 5,000 UNIT/ML IJ SOLN: 5000 unit/mL | INTRAMUSCULAR | Qty: 1

## 2015-12-21 MED FILL — NORMAL SALINE FLUSH 0.9 % INJECTION SYRINGE: INTRAMUSCULAR | Qty: 10

## 2015-12-21 MED FILL — FUROSEMIDE 10 MG/ML IJ SOLN: 10 mg/mL | INTRAMUSCULAR | Qty: 4

## 2015-12-21 MED FILL — LEVOFLOXACIN IN D5W 250 MG/50 ML IV PIGGY BACK: 250 mg/50 mL | INTRAVENOUS | Qty: 50

## 2015-12-21 NOTE — Progress Notes (Signed)
Problem: Mobility Impaired (Adult and Pediatric)  Goal: *Acute Goals and Plan of Care (Insert Text)  Physical Therapy Goals  Initiated 12/21/2015  1.  Patient will move from supine to sit and sit to supine  and scoot up and down in bed with supervision/set-up within 7 day(s).    2.  Patient will transfer from bed to chair and chair to bed with supervision/set-up using the least restrictive device within 7 day(s).  3.  Patient will perform sit to stand with supervision/set-up within 7 day(s).  4.  Patient will ambulate with minimal assistance/contact guard assist for 25 feet with the least restrictive device within 7 day(s).   5.  Patient will improve Berg Balance score by 7 points within 7 days.    physical Therapy EVALUATION- neuro population    Patient: [name] [name] (82 y.o. male)  Date: 12/21/2015  Primary Diagnosis: Acute encephalopathy  Altered mental state        Precautions: sitter       ASSESSMENT :  Based on the objective data described below, the patient presents with impaired functional mobility as compared to baseline level 2* decreased command following, decreased concentration/attention to task, intermittent agitation (calm during session), decreased functional strength, impaired gait, impaired balance, decreased tolerance to activity, and poor safety awareness following admission for AMS. CT negative for acute process. MRI pending. Cleared for mobility assessment this PM by RN, no longer in restraints and pleasantly confused. Prior to admission, family members report that pt lived at home with his wife and was indep with BADLs and ambulated using a RW. He was able to mobilize to EOB with up to min A and increased time with cues for initiation and sequencing. Demos good sitting balance with B UE support. Completed sit<>stands x2 reps from EOB with min A and increased time to obtain fully upright posture. Attempted side stepping at EOB however pt unable to follow gross motor command despite  various attempts through cues and demonstration. Pt became increasingly insistent, impulsive, and unsafe and therefore was assisted back to bed. Assisted to supine with max A x2 and bed transitioned to modified chair position with B feet supported on board. NAD. Of note, pt also demonstrating abnormal "puffing" breathing with lips closed however able to breathe normally when cued to open mouth breathe. Pt at this time is significantly below his cognitive and functional baseline and unsafe to return home with spouse. Recommending discharge to rehab setting. Will continue to follow.    Patient will benefit from skilled intervention to address the above impairments.  Patient???s rehabilitation potential is considered to be Good  Factors which may influence rehabilitation potential include:              None noted             Mental ability/status             Medical condition             Home/family situation and support systems             Safety awareness             Pain tolerance/management             Other:      PLAN :  Recommendations and Planned Interventions:               Bed Mobility Training  Neuromuscular Re-Education               Transfer Training                         Orthotic/Prosthetic Training               Gait Training                               Modalities               Therapeutic Exercises                 Edema Management/Control               Therapeutic Activities                  Patient and Family Training/Education               Other (comment):  Frequency/Duration: Patient will be followed by physical therapy 5 times a week to address goals.  Discharge Recommendations: Rehab  Further Equipment Recommendations for Discharge: TBD     SUBJECTIVE:   Patient stated ???I'm feelin pretty good.???    OBJECTIVE DATA SUMMARY:   HISTORY:    Past Medical History:   Diagnosis Date   ??? CAD (coronary artery disease)    ??? Ill-defined condition     MI 14 years ago     ??? Ill-defined condition     a fib     Past Surgical History:   Procedure Laterality Date   ??? HX ORTHOPAEDIC      left hip permantly dislocated     Prior Level of Function/Home Situation: Lives with wife in a singly story condo, 1 step to enter. Indep with BADLs and ambulated with RW  Personal factors and/or comorbidities impacting plan of care:     Home Situation  Home Environment: Private residence  # Steps to Enter: 1  One/Two Story Residence: One story  Living Alone: No  Support Systems: Games developerpouse/Significant Other/Partner  Patient Expects to be Discharged to:: Rehabilitation facility  Current DME Used/Available at Home: Environmental consultantWalker, rolling    EXAMINATION/PRESENTATION/DECISION MAKING:   Critical Behavior:  Neurologic State: Anesthetized, Confused  Orientation Level: Oriented to person  Cognition: Impulsive, Poor safety awareness, Decreased attention/concentration, Decreased command following  Safety/Judgement: Decreased awareness of environment, Decreased awareness of need for assistance, Decreased awareness of need for safety, Decreased insight into deficits  Hearing:  Auditory  Auditory Impairment: Hard of hearing, bilateral, Hearing aid(s) (sent home with family)  Skin:  Intact where exposed  Edema: none noted  Range Of Motion:  AROM: Generally decreased, functional         Strength:    Strength: Generally decreased, functional     Tone & Sensation:   Tone: Normal   Sensation:  (unable to verbalize)       Coordination:  Coordination: Generally decreased, functional  Vision:      Functional Mobility:  Bed Mobility:     Supine to Sit: Additional time;Minimum assistance  Sit to Supine: Assist x2;Maximum assistance     Transfers:  Sit to Stand: Assist x2;Minimum assistance  Stand to Sit: Assist x2;Minimum assistance     Balance:   Sitting: Impaired  Sitting - Static: Good (unsupported)  Sitting - Dynamic: Fair (occasional)  Standing: Impaired  Standing - Static: Fair;Constant support  Standing - Dynamic :  Poor       Functional Measure  Berg Balance Test:    Sitting to Standing: 1  Standing Unsupported: 0  Sitting with Back Unsupported: 3  Standing to Sitting: 0  Transfers: 0  Standing Unsupported with Eyes Closed: 0  Standing Unsupported with Feet Together: 0  Reach Forward with Outstretched Arm: 0  Pick Up Object: 0  Turn to Look Over Shoulders: 0  Turn 360 Degrees: 0  Alternate Foot on Step/Stool: 0  Standing Unsupported One Foot in Front: 0  Stand on One Leg: 0  Total: 4         56=Maximum possible score;   0-20=High fall risk  21-40=Moderate fall risk   41-56=Low fall risk     Berg Balance Test and G-code impairment scale:  Percentage of Impairment CH    0%   CI    1-19% CJ    20-39% CK    40-59% CL    60-79% CM    80-99% CN     100%   Berg   Score 0-56 56 45-55 34-44 23-33 12-22 1-11 0       G codes:  In compliance with CMS???s Claims Based Outcome Reporting, the following G-code set was chosen for this patient based on their primary functional limitation being treated:    The outcome measure chosen to determine the severity of the functional limitation was the [place]erg with a score of 4/56 which was correlated with the impairment scale.    ? Mobility - Walking and Moving Around:    [id number] - CURRENT STATUS: CM - 80%-99% impaired, limited or restricted   [id number] - GOAL STATUS: CL - 60%-79% impaired, limited or restricted   [id number] - D/C STATUS:  ---------------To be determined---------------     Physical Therapy Evaluation Charge Determination   History Examination Presentation Decision-Making   HIGH Complexity :3+ comorbidities / personal factors will impact the outcome/ POC  MEDIUM Complexity : 3 Standardized tests and measures addressing body structure, function, activity limitation and / or participation in recreation  LOW Complexity : Stable, uncomplicated  HIGH Complexity : FOTO score of 1- 25       Based on the above components, the patient evaluation is determined to be of the following complexity level: LOW       Pain:   Pain Scale 1: Numeric (0 - 10)  Pain Intensity 1: 0        Activity Tolerance:   NAD  Please refer to the flowsheet for vital signs taken during this treatment.  After treatment:        Patient left in no apparent distress sitting up in chair       Patient left in no apparent distress in bed, modified chair position       Call bell left within reach       Nursing notified       Caregiver present       Bed alarm activated    COMMUNICATION/EDUCATION:   The patient???s plan of care was discussed with: Occupational Therapist and Registered Nurse.        Fall prevention education was provided and the patient/caregiver indicated understanding.    Patient/family have participated as able in goal setting and plan of care.    Patient/family agree to work toward stated goals and plan of care.    Patient understands intent and goals of therapy, but is neutral about his/her participation.    Patient is unable to participate in  goal setting and plan of care.    Thank you for this referral.  Andree Coss, PT, DPT   Time Calculation: 31 mins

## 2015-12-21 NOTE — Other (Signed)
Letter of Status Determination:   Recommend hospitalization status upgraded from   OBSERVATION  to INPATIENT  Status     Pt Name:  [name] [name]   MR#   HAR # [id number] /  [id number]   CSN#  [id number]   Room and Hospital  677/01  @ [address]   Hospitalization date  12/19/2015  2:57 PM   Current Attending Physician  Teofilo PodPunit Goel, MD   Principal diagnosis  Acute encephalopathy      Clinicals  82 y.o. y.o  male hospitalized with above diagnosis   This pt is noted to have persistent encephalopathic symptoms.   Initial workup is negative and supportive care is being provided.     We also note that despite reasonably age appropriate stable renal function, his troponin has gone up. We do notice this pt suffers from cardiomyopathy with severely depressed EF.    Milliman (MCG) criteria   Does  NOT apply    STATUS DETERMINATION  This patient is at above high risk of deterioration based on documented presenting clinical data, comorbid conditions, high risk of adverse events and current acute care course.       Mr. [name] [name] now meets Inpatient Admission status criteria in accordance with CMS regulation Section 43 CFR 412.3. Specifically, due to medical necessity the patient's stay now exceeds Two Midnights.     It is our recommendation that this patient's hospitalization status should be upgraded from  OBSERVATION to INPATIENT status.     The final decision of the patient's hospitalization status depends on the attending physician's judgment            Additional comments     [place]or: [facility] MED[facility] / Plan: [place] MEDICARE PART A & B / Product Type: Medicare /         Gabriel CarinaHasan M. Ancelmo Hunt MD MPH FACP     Physician Advisor    [facility]   Care Management & Compliant Documentation Management Program  [facility]  [facility]    [facility]   President Medical Staff, [facility]     Cell  [phone]        [phone]    .

## 2015-12-21 NOTE — Progress Notes (Signed)
12/21/15 1500   Vitals   Temp 97.7 ??F (36.5 ??C)   Temp Source Axillary   Pulse (Heart Rate) (!) 105   Heart Rate Source Monitor   Resp Rate 23   O2 Sat (%) 98 %   Level of Consciousness Alert   BP (!) 156/92   MAP (Calculated) 113   BP 1 Location Left arm   BP 1 Method Automatic   BP Patient Position During activity   Cardiac Rhythm A Fib   MEWS Score 3   Pain 1   Pain Scale 1 Numeric (0 - 10)   Pain Intensity 1 0   Patient Stated Pain Goal 0   Oxygen Therapy   O2 Device Room air   Patient Observation   Patient Turned Turns self   Observations in bed; working with PT   MEWS OF 3. MD aware, patient is working with PT.

## 2015-12-21 NOTE — Progress Notes (Signed)
Occupational Therapy EVALUATION  Patient: [name] [name] (82 y.o. male)  Date: 12/21/2015  Primary Diagnosis: Acute encephalopathy  Altered mental state        Precautions: Fall       ASSESSMENT :  Based on the objective data described below, the patient presents with overall Min A x1-2 with RW for functional mobility, up to Total A for lower body ADLs, and supervision-Min A for upper body ADLs s/p AMS. Patient received supine in bed with wife and daughter present, patient calm and cooperative throughout session; confused. Patient very HOH, can only hear in L ear. Patient presenting with generalized weakness, deconditioning, decreased awareness of environment, decreased insight into deficits, mild impulsivity, some agitation (not seen in this eval), and oral motor ticks which are all limiting his ability to participate in ADLs. Patient with baseline dementia. Has been restricted to bed since Sunday d/t agitation, confused, and combativeness. Patient progressing well with PT/OT today, unable to progress ambulation however d/t patient's inability to safely follow commands, remain upright, and properly use RW. Patient will require rehab when medically stable, working well towards tolerating 3 hours/day of therapy.    Patient will benefit from skilled intervention to address the above impairments.  Patient???s rehabilitation potential is considered to be Good  Factors which may influence rehabilitation potential include:                None noted               Mental ability/status               Medical condition               Home/family situation and support systems               Safety awareness               Pain tolerance/management               Other:      PLAN :  Recommendations and Planned Interventions:                 Self Care Training                          Therapeutic Activities                 Functional Mobility Training            Cognitive Retraining                  Therapeutic Exercises                   Endurance Activities                 Balance Training                           Neuromuscular Re-Education                 Visual/Perceptual Training        Home Safety Training                 Patient Education                         Family Training/Education  Other (comment):    Frequency/Duration: Patient will be followed by occupational therapy 5 times a week to address goals.  Discharge Recommendations: Inpatient Rehab  Further Equipment Recommendations for Discharge: TBD     SUBJECTIVE:   Patient stated ???I'm trying to turn.???    OBJECTIVE DATA SUMMARY:   HISTORY:   Past Medical History:   Diagnosis Date   ??? CAD (coronary artery disease)    ??? Ill-defined condition     MI 14 years ago    ??? Ill-defined condition     a fib     Past Surgical History:   Procedure Laterality Date   ??? HX ORTHOPAEDIC      left hip permantly dislocated       Prior Level of Function/Environment/Context: Per family report, patient and [place] weeks ago. Live in condo with steps. Walk in shower (do not use), patient taking bird baths. Patient typically able to ambulate Mod I with RW independently and complete basic ADLs (dressing, bathing, feeding, etc) without assist. Patient does have dementia per daughter. Patient HOH and only able to partially hear out of L ear.     Home Situation  Home Environment: Private residence  # Steps to Enter: 1  One/Two Story Residence: One story  Living Alone: No  Support Systems: Games developer  Patient Expects to be Discharged to:: Rehabilitation facility  Current DME Used/Available at Home: Walker, rolling    Right hand dominant     Left hand dominant    EXAMINATION OF PERFORMANCE DEFICITS:  Cognitive/Behavioral Status:  Neurologic State: Anesthetized;Confused  Orientation Level: Oriented to person  Cognition: Impulsive;Poor safety awareness;Decreased  attention/concentration;Decreased command following  Perception: Appears intact  Perseveration: No perseveration noted  Safety/Judgement: Decreased awareness of environment;Decreased awareness of need for assistance;Decreased awareness of need for safety;Decreased insight into deficits    Skin: Appears intact    Edema: None noted in BUEs     Hearing:  Auditory  Auditory Impairment: Hard of hearing, bilateral, Hearing aid(s) (sent home with family)    Vision/Perceptual:    Tracking: Able to track stimulus in all quadrants w/o difficulty                      Acuity: Within Defined Limits    Corrective Lenses: Glasses    Range of Motion:  AROM: Generally decreased, functional                         Strength:  Strength: Generally decreased, functional                Coordination:  Coordination: Generally decreased, functional  Fine Motor Skills-Upper: Left Intact;Right Intact    Gross Motor Skills-Upper: Left Intact;Right Intact    Tone & Sensation:  Tone: Normal  Sensation:  (unable to verbalize)                      Balance:  Sitting: Impaired  Sitting - Static: Good (unsupported)  Sitting - Dynamic: Fair (occasional)  Standing: Impaired  Standing - Static: Fair;Constant support  Standing - Dynamic : Poor    Functional Mobility and Transfers for ADLs:  Bed Mobility:  Supine to Sit: Additional time;Minimum assistance  Sit to Supine: Assist x2;Maximum assistance    Transfers:  Sit to Stand: Assist x2;Minimum assistance  Stand to Sit: Assist x2;Minimum assistance  Toilet Transfer : Minimum assistance;Assist x2 (For SPT to  BSC - patient with decreased command following)    ADL Assessment:  Feeding: Supervision    Oral Facial Hygiene/Grooming: Supervision    Bathing: Maximum assistance    Upper Body Dressing: Maximum assistance    Lower Body Dressing: Total assistance    Toileting: Total assistance      * Inferred per obs of BUE ROM, functional mobility, activity tolerance, cognitive state, and attention           ADL Intervention and task modifications:    Cognitive Retraining  Safety/Judgement: Decreased awareness of environment;Decreased awareness of need for assistance;Decreased awareness of need for safety;Decreased insight into deficits    Functional Measure:  Barthel Index:    Bathing: 0  Bladder: 5  Bowels: 5  Grooming: 0  Dressing: 0  Feeding: 5  Mobility: 0  Stairs: 0  Toilet Use: 5  Transfer (Bed to Chair and Back): 5  Total: 25       Barthel and G-code impairment scale:  Percentage of impairment CH  0% CI  1-19% CJ  20-39% CK  40-59% CL  60-79% CM  80-99% CN  100%   Barthel Score 0-100 100 99-80 79-60 59-40 20-39 1-19   0   Barthel Score 0-20 20 17-19 13-16 9-12 5-8 1-4 0      The Barthel ADL Index: Guidelines  1. The index should be used as a record of what a patient does, not as a record of what a patient could do.  2. The main aim is to establish degree of independence from any help, physical or verbal, however minor and for whatever reason.  3. The need for supervision renders the patient not independent.  4. A patient's performance should be established using the best available evidence. Asking the patient, friends/relatives and nurses are the usual sources, but direct observation and common sense are also important. However direct testing is not needed.  5. Usually the patient's performance over the preceding 24-48 hours is important, but occasionally longer periods will be relevant.  6. Middle categories imply that the patient supplies over 50 per cent of the effort.  7. Use of aids to be independent is allowed.    Clarisa KindredMahoney, F.l., Barthel, D.W. [phone](1965). Functional evaluation: the Barthel Index. Md State Med J (14)2.  Zenaida NieceVan der GambellPutten, J.J.M.F, ClioHobart, Ian MalkinJ.C., Margret ChanceFreeman, J.A., [place], [place].J. (1999). Measuring the change indisability after inpatient rehabilitation; comparison of the responsiveness of the Barthel Index and Functional Independence Measure. Journal of Neurology, Neurosurgery, and Psychiatry,  66(4), [phone].  Dawson BillsVan Exel, N.J.A, [address] [address] [place],  W.J.M, & Koopmanschap, M.A. (2004.) Assessment of post-stroke quality of life in cost-effectiveness studies: The usefulness of the Barthel Index and the EuroQoL-5D. Quality of Life Research, 13, [phone]         G codes:  In compliance with CMS???s Claims Based Outcome Reporting, the following G-code set was chosen for this patient based on their primary functional limitation being treated:    The outcome measure chosen to determine the severity of the functional limitation was the Barthel Index with a score of 25/100 which was correlated with the impairment scale.    ? Self Care:    [phone] - CURRENT STATUS: CL - 60%-79% impaired, limited or restricted   [id number] - GOAL STATUS: CK - 40%-59% impaired, limited or restricted   [id number] - D/C STATUS:  ---------------To be determined---------------       Occupational Therapy Evaluation Charge Determination   History Examination Decision-Making   LOW Complexity : Brief  history review  HIGH Complexity : 5 or more performance deficits relating to physical, cognitive , or psychosocial skils that result in activity limitations and / or participation restrictions HIGH Complexity : Patient presents with comorbidities that affect occupational performance. Signifigant modification of tasks or assistance (eg, physical or verbal) with assessment (s) is necessary to enable patient to complete evaluation       Based on the above components, the patient evaluation is determined to be of the following complexity level: LOW   Pain:Pain Scale 1: Numeric (0 - 10)  Pain Intensity 1: 0              Activity Tolerance:   Good.  Please refer to the flowsheet for vital signs taken during this treatment.  After treatment:    Patient left in no apparent distress sitting up in chair   Patient left in no apparent distress in bed   Call bell left within reach   Nursing notified   Caregiver present   Bed alarm activated    COMMUNICATION/EDUCATION:    The patient???s plan of care was discussed with: Physical Therapist and Registered Nurse.   Home safety education was provided and the patient/caregiver indicated understanding.   Patient/family have participated as able in goal setting and plan of care.   Patient/family agree to work toward stated goals and plan of care.   Patient understands intent and goals of therapy, but is neutral about his/her participation.   Patient is unable to participate in goal setting and plan of care.  This patient???s plan of care is appropriate for delegation to OTA.    Thank you for this referral.  Carmelina PaddockJennifer M Monks, OT  Time Calculation: 30 mins

## 2015-12-21 NOTE — Progress Notes (Signed)
Hospitalist paged d/t Troponin trending up with latest lab result and Pt also experiencing expiratory wheezing at rest. No reports of CP or SOB. Orders received from hospitalist for chest xray, IV lasix at this time, repeat AM troponin and Cardiology consult. Pt also agitated and attempting to pull IV and is taking of tele leads. Order for soft wrist restraints received. Will continue to monitor.

## 2015-12-21 NOTE — Progress Notes (Signed)
Problem: Falls - Risk of  Goal: *Absence of Falls  Document Schmid Fall Risk and appropriate interventions in the flowsheet.   Outcome: Progressing Towards Goal  Fall Risk Interventions:  Mobility Interventions: Bed/chair exit alarm, Communicate number of staff needed for ambulation/transfer, OT consult for ADLs, Patient to call before getting OOB, PT Consult for mobility concerns, PT Consult for assist device competence, Strengthening exercises (ROM-active/passive)    Mentation Interventions: Adequate sleep, hydration, pain control, Door open when patient unattended, Family/sitter at bedside, Increase mobility, More frequent rounding, Reorient patient, Room close to nurse's station    Medication Interventions: Evaluate medications/consider consulting pharmacy, Patient to call before getting OOB, Teach patient to arise slowly    Elimination Interventions: Call light in reach, Elevated toilet seat, Patient to call for help with toileting needs, Urinal in reach, Toileting schedule/hourly rounds    History of Falls Interventions: Evaluate medications/consider consulting pharmacy, Investigate reason for fall, Room close to nurse's station

## 2015-12-21 NOTE — Other (Signed)
Bedside and Verbal shift change report given to Vernona RiegerLaura, Charity fundraiserN (oncoming nurse) by Corrie DandyMary, RN (offgoing nurse). Report included the following information SBAR, Kardex, Intake/Output, MAR, Accordion, Recent Results and Cardiac Rhythm Afib.

## 2015-12-21 NOTE — Progress Notes (Signed)
Hospitalist Progress Note  Teofilo Pod, MD  Answering service: [phone] OR [phone] from in house phone  Cell: [phone]      Date of Service:  12/21/2015  NAME:  [name] [name]  DOB:  1920/09/11  MRN:  [id number]      Admission Summary:     Patient is a 82 year old male with past medical history of CAD s/p CABG, cardiomyopathy with EF of 20%, chronic atrial fibrillation, dementia, chronic hip dislocation, very hard on hearing who presented to the ER   Via EMS with the complaint of confusion, garbled speech and generalized body weakness.    Interval history / Subjective:      F/u for altered mental status.    Patient is very hard on hearing.  History was unobtainable    Patient is awake and calm.      Assessment & Plan:     Altered mental status likely secondary to metabolic encephalopathy vs acute stroke.  This is more likely due to metabolic encephalopathy in the setting of dementia  Initial CT head was negative. Carotid dopplers unremarkable  Echo: EF of 20- 25% with severe diffuse hypokinesis with distinct regional wall motion abnormalities.  -MRI not yet done due to combativeness of patient but not sure if it is going to help in the treatment as now the patient looks much better  - Put on his hearing aids to in order to reduce sensory impairments.  - Continue supportive care for now  - Avoid benzodiazepine's and benadryl  -Will consult neurology as I don't think the patient has Pneumonia, UTI    Chronic Ischemic cardiomyopathy: NYHA class unable to be determined  Echo with EF 20-25%  Hold Lasix.  Fluid restriction < 1800 cc/ day.  Continue aspirin, mag-oxide and zocor  Daily weight  -Appreciate cardiology    Dementia with acute behavioral disturbance  Supportive care/one to one observation      Hyponatremia: Fluid restriction to 1800 cc/day  monitor    Possible left lower lobe PNA: As evidenced by small left lower lobe infiltrate on CXR   On LVQ but not sure if the patient needs it. Will continue today and then likely discharge tomorrow  No fever or leucocytosis    CAD s/p CABG x 6.  Resume home aspirin/statin    Chronic atrial fibrillation; Now rate controlled  Continue aspirin.    Elevated troponin: obtain 2 more set  EKG afib with non specific T wave abnormalities    Hypomagnesemia: replete with magnesium sulfate  Keep mag > 2 and K > 4    Cardiac diet    PT/OT inpatient rehab, CM informed    Code status: DNR  DVT prophylaxis: Lovenox    Plan: inpatient rehab    Care Plan discussed with: Patient/Family and Nurse  Disposition: TBD Will change the patient's status to inpatient     Hospital Problems  Date Reviewed: 01/01/2016          Codes Class Noted POA    * (Principal)Acute encephalopathy ICD-10-CM: G93.40  ICD-9-CM: 348.30  2016/01/01 Unknown        Dementia (Chronic) ICD-10-CM: F03.90  ICD-9-CM: 294.20  Jan 01, 2016 Yes        Mild dehydration ICD-10-CM: E86.0  ICD-9-CM: 276.51  01/01/16 Yes        CAD (coronary artery disease) (Chronic) ICD-10-CM: I25.10  ICD-9-CM: 414.00  2016/01/01 Yes        Chronic a-fib (HCC) (Chronic) ICD-10-CM: I48.2  ICD-9-CM: 427.31  12/19/2015 Yes        Dislocation, hip (HCC) (Chronic) ICD-10-CM: A21.308MS73.006A  ICD-9-CM: 835.00  12/19/2015 Unknown        Hard of hearing (Chronic) ICD-10-CM: H91.90  ICD-9-CM: 389.9  12/19/2015 Yes        Cardiomyopathy (HCC) (Chronic) ICD-10-CM: I42.9  ICD-9-CM: 425.4  12/19/2015 Unknown                Review of Systems:   Pertinent items are noted in HPI.       Vital Signs:    Last 24hrs VS reviewed since prior progress note. Most recent are:  Visit Vitals   ??? BP 121/75 (BP 1 Location: Left arm)   ??? Pulse 66   ??? Temp 97.6 ??F (36.4 ??C)   ??? Resp 16   ??? Ht 6\' 1"  (1.854 m)   ??? Wt 82.9 kg (182 lb 11.2 oz)   ??? SpO2 100%   ??? BMI 24.1 kg/m2         Intake/Output Summary (Last 24 hours) at 12/21/15 1326  Last data filed at 12/21/15 0400   Gross per 24 hour   Intake                0 ml    Output              500 ml   Net             -500 ml        Physical Examination:             Constitutional:  No acute distress, cooperative, pleasant. Very hard of hearing. Elderly   ENT:  Oral mucous moist, oropharynx benign. Neck supple,    Resp:  CTA bilaterally. No wheezing/rhonchi/rales. No accessory muscle use   CV:  Irregularly irregular no murmurs, gallops, rubs    GI:  Soft, non distended, non tender. normoactive bowel sounds, no hepatosplenomegaly     Musculoskeletal:  No edema, warm, 2+ pulses throughout    Neurologic:  Moves all extremities.  Awake. Orientation times zero            Data Review:          Labs:     Recent Labs      12/20/15   0620  12/19/15   1522   WBC  8.5  9.5   HGB  12.4  12.8   HCT  36.8  38.0   PLT  247  264     Recent Labs      12/20/15   0620  12/19/15   1522   NA  133*  134*   K  3.8  4.5   CL  97  102   CO2  26  25   BUN  18  19   CREA  0.87  0.89   GLU  91  95   CA  8.7  8.7   MG  1.5*   --      Recent Labs      12/20/15   0620  12/19/15   1522   SGOT  46*  29   ALT  21  19   AP  61  62   TBILI  1.6*  0.9   TP  7.2  7.4   ALB  3.7  3.6   GLOB  3.5  3.8     Recent Labs      12/19/15   1522  INR  1.1   PTP  10.8      No results for input(s): FE, TIBC, PSAT, FERR in the last 72 hours.   Lab Results   Component Value Date/Time    Folate 25.9 12/19/2015 03:22 PM      No results for input(s): PH, PCO2, PO2 in the last 72 hours.  Recent Labs      12/21/15   0615  12/20/15   2147  12/20/15   1641   TROIQ  0.47*  0.54*  0.36*     No results found for: CHOL, CHOLX, CHLST, CHOLV, HDL, LDL, LDLC, DLDLP, TGLX, TRIGL, TRIGP, CHHD, CHHDX  Lab Results   Component Value Date/Time    Glucose (POC) 96 12/19/2015 03:17 PM     Lab Results   Component Value Date/Time    Color YELLOW/STRAW 12/19/2015 03:59 PM    Appearance CLEAR 12/19/2015 03:59 PM    Specific gravity 1.028 12/19/2015 03:59 PM    pH (UA) 7.0 12/19/2015 03:59 PM    Protein 100 12/19/2015 03:59 PM     Glucose NEGATIVE  12/19/2015 03:59 PM    Ketone NEGATIVE  12/19/2015 03:59 PM    Bilirubin NEGATIVE  12/19/2015 03:59 PM    Urobilinogen 0.2 12/19/2015 03:59 PM    Nitrites NEGATIVE  12/19/2015 03:59 PM    Leukocyte Esterase NEGATIVE  12/19/2015 03:59 PM    Epithelial cells FEW 12/19/2015 03:59 PM    Bacteria NEGATIVE  12/19/2015 03:59 PM    WBC 0-4 12/19/2015 03:59 PM    RBC 0-5 12/19/2015 03:59 PM         Medications Reviewed:     Current Facility-Administered Medications   Medication Dose Route Frequency   ??? levoFLOXacin (LEVAQUIN) 250 mg in D5W IVPB  250 mg IntraVENous Q24H   ??? aspirin delayed-release tablet 81 mg  81 mg Oral DAILY   ??? sodium chloride (NS) flush 5-10 mL  5-10 mL IntraVENous Q8H   ??? sodium chloride (NS) flush 5-10 mL  5-10 mL IntraVENous PRN   ??? ondansetron (ZOFRAN) injection 4 mg  4 mg IntraVENous Q4H PRN   ??? heparin (porcine) injection 5,000 Units  5,000 Units SubCUTAneous Q12H     ______________________________________________________________________  EXPECTED LENGTH OF STAY: - - -  ACTUAL LENGTH OF STAY:          0                 Teofilo PodPunit Christalyn Goertz, MD

## 2015-12-21 NOTE — Progress Notes (Signed)
Occupational Therapy Note 1351    Order acknowledged and chart reviewed. Per interdisciplinary rounds patient remains with significant AMS, highly agitated, combative, in restraints. Patient not appropriate for skilled ADL assessment at this time. Will defer and continue to follow to complete OT eval as appropriate. Thank you.    Richardo PriestJennifer Monks, OTR/L

## 2015-12-21 NOTE — Progress Notes (Signed)
Problem: Falls - Risk of  Goal: *Absence of Falls  Document Schmid Fall Risk and appropriate interventions in the flowsheet.   Outcome: Progressing Towards Goal  Fall Risk Interventions:  Mobility Interventions: Communicate number of staff needed for ambulation/transfer, OT consult for ADLs, Patient to call before getting OOB, PT Consult for mobility concerns, PT Consult for assist device competence, Strengthening exercises (ROM-active/passive), Utilize walker, cane, or other assitive device, Utilize gait belt for transfers/ambulation    Mentation Interventions: Adequate sleep, hydration, pain control, Bed/chair exit alarm, Door open when patient unattended, Eyeglasses and hearing aids, Familiar objects from home, Family/sitter at bedside, More frequent rounding, Reorient patient, Room close to nurse's station, Self-releasing belt, Toileting rounds, Update white board    Medication Interventions: Bed/chair exit alarm, Evaluate medications/consider consulting pharmacy, Patient to call before getting OOB, Teach patient to arise slowly, Utilize gait belt for transfers/ambulation    Elimination Interventions: Call light in reach, Patient to call for help with toileting needs, Urinal in reach, Toileting schedule/hourly rounds, Toilet paper/wipes in reach    History of Falls Interventions: Consult care management for discharge planning, Door open when patient unattended, Evaluate medications/consider consulting pharmacy, Investigate reason for fall, Room close to nurse's station, Utilize gait belt for transfer/ambulation

## 2015-12-21 NOTE — Progress Notes (Signed)
CM met with pt's wife and daughter, Loletha Carrow [phone]), to discuss pt's discharge plan. At this time, the family would like to take this pt home if he becomes re-oriented. However, he is still disoriented. CM will follow. Vaughan Basta Garren,BSW,ACM-SW

## 2015-12-21 NOTE — Consults (Signed)
Note       Subjective:      Date of  Admission: 12/19/2015  2:57 PM     Admission type:Emergency    [name] [name] is a 82 y.o. male  With past medical history of [place] ago, chronic atrial fibrillation for which he was recently taken off of anticoagulation due to falls, mild dementia, and chronic systolic heart failure with an EF of approximately 20% who was in his usual state of health until Sunday afternoon when he became confused with garbled speech.  He was seen in the ER and given Ativan and Haldol which apparently made him quite combative.  He is sleeping at this time, but family states he was lucid earlier this morning and appeared back to his neurological baseline.  Primary team is working up mental status change and MRI is pending.  We are consulted due to a mildly elevated troponin.  Per family, he has not recently complained of chest pain or shortness of breath.  He has mild LE swelling which wife states is at his baseline.  [place] [facility] [place] [place] to [place] closer to their daughter.    Patient Active Problem List    Diagnosis Date Noted   ??? Chronic systolic heart failure (HCC) 12/20/2015   ??? Acute encephalopathy 12/19/2015   ??? Dementia 12/19/2015   ??? Mild dehydration 12/19/2015   ??? CAD (coronary artery disease) 12/19/2015   ??? Chronic a-fib (HCC) 12/19/2015   ??? Dislocation, hip (HCC) 12/19/2015   ??? Hard of hearing 12/19/2015   ??? Cardiomyopathy (HCC) 12/19/2015      Kassie MendsPatrick M Woodward, MD  Past Medical History:   Diagnosis Date   ??? CAD (coronary artery disease)    ??? Ill-defined condition     MI 14 years ago    ??? Ill-defined condition     a fib      Past Surgical History:   Procedure Laterality Date   ??? HX ORTHOPAEDIC      left hip permantly dislocated     Allergies   Allergen Reactions   ??? Codeine Nausea Only   ??? Penicillins Shortness of Breath      History reviewed. No pertinent family history.    Current Facility-Administered Medications   Medication Dose Route Frequency   ??? levoFLOXacin (LEVAQUIN) 250 mg in D5W IVPB  250 mg IntraVENous Q24H   ??? aspirin delayed-release tablet 81 mg  81 mg Oral DAILY   ??? sodium chloride (NS) flush 5-10 mL  5-10 mL IntraVENous Q8H   ??? sodium chloride (NS) flush 5-10 mL  5-10 mL IntraVENous PRN   ??? ondansetron (ZOFRAN) injection 4 mg  4 mg IntraVENous Q4H PRN   ??? heparin (porcine) injection 5,000 Units  5,000 Units SubCUTAneous Q12H        Prior to Admission Medications:  Prior to Admission medications    Medication Sig Start Date End Date Taking? Authorizing Provider   BABY ASPIRIN PO Take 81 mg by mouth.   Yes Phys Other, MD   simvastatin (ZOCOR) 40 mg tablet Take 40 mg by mouth nightly.   Yes Phys Other, MD   omeprazole (PRILOSEC) 20 mg capsule Take 20 mg by mouth daily.   Yes Phys Other, MD   magnesium oxide (MAG-OX) 400 mg tablet Take 400 mg by mouth daily.   Yes Phys Other, MD   potassium chloride SR (KLOR-CON 10) 10 mEq tablet  Take 10 mEq by mouth two (2) times a day.   Yes Phys Other, MD   furosemide (LASIX) 20 mg tablet Take 20 mg by mouth daily.   Yes Phys Other, MD        Review of Symptoms:  Unobtainable due to mental status     Subjective:    24 hr VS reviewed, overall VSSAF  Temp (24hrs), Avg:97.7 ??F (36.5 ??C), Min:97.4 ??F (36.3 ??C), Max:98.2 ??F (36.8 ??C)    Patient Vitals for the past 8 hrs:   Pulse   12/21/15 1100 66   12/21/15 0700 84    Patient Vitals for the past 8 hrs:   Resp   12/21/15 1100 16   12/21/15 0700 17    Patient Vitals for the past 8 hrs:   BP   12/21/15 1100 121/75   12/21/15 0700 117/74          Intake/Output Summary (Last 24 hours) at 12/21/15 1113  Last data filed at 12/21/15 0400   Gross per 24 hour   Intake                0 ml   Output              500 ml   Net             -500 ml         Physical Exam (complete single organ system exam)    Cons: The patient is sleeping and not arousable at this time   Neck: Flat JVP without appreciable HJR.  Resp: Normal respiratory effort with clear lungs bilaterally.   CV: irregularly irregular rate and rhythm.   PMI not palpated. Normal S1,S2  No gallop or rubs appreciated.  No murmur apprciated.  Intact carotid upstroke bilaterally without appreciated bruits.  Abdominal aorta not palpated; no abdominal bruit noted.  Normal femoral pulses without bruits.  Intact pedal pulses.     Mild bilateral peripheral edema  GI: No abd mass noted, soft; no organomegaly noted.  Bowel sounds present.   Muscular:  No significant kyphosis.  Strength WNL for age.  Ext: No cyanosis, clubbing, or stigmata of peripheral embolization.   Derm: No ulcers or stasis dermatitis of lower extremities.   Neuro: Lethargic       Cardiographics    Telemetry: a fib with controlled rate    Labs:   Recent Results (from the past 24 hour(s))   TROPONIN I    Collection Time: 12/20/15  4:41 PM   Result Value Ref Range    Troponin-I, Qt. 0.36 (H) <0.05 ng/mL   TROPONIN I    Collection Time: 12/20/15  9:47 PM   Result Value Ref Range    Troponin-I, Qt. 0.54 (H) <0.05 ng/mL   TROPONIN I    Collection Time: 12/21/15  6:15 AM   Result Value Ref Range    Troponin-I, Qt. 0.47 (H) <0.05 ng/mL       Assessment/Plan:    1.  Elevated troponin:  Minimally elevated troponin in 82 yo male with known CAD s/p bypass surgery 12 years ago.  No complaints per family of chest pain, shortness of breath.  Continue medical management at this time with ASA, statin.  He has not been on a beta blocker, presumably due to hypotension.  Will plan to follow for ongoing cardiac card as an outpatient.    2.  A fib:  Chronic.  He has had multiple recent falls and thus anticoagulation  was discontinued.  Cont ASA at this time.  Rate controlled.    3.  Altered mental status:  Workup per primary team/neurology.    Jordan Likes, MD

## 2015-12-21 NOTE — Other (Signed)
IDR/SLIDR Summary          Patient: [name] [name] MRN: [id number]    Age: 82 y.o.     Birthdate: 05-06-1920 Room/Bed: 677/01   Admit Diagnosis: Acute encephalopathy  Principal Diagnosis: Acute encephalopathy   Goals: safety  Readmission: NO  Quality Measure: CHF  VTE Prophylaxis: Chemical  Influenza Vaccine screening completed? YES  Pneumococcal Vaccine screening completed? NO  Mobility needs: Yes   Nutrition plan:Yes  Consults:P.T and O.T.    Financial concerns:No  Escalated to CM? NO  RRAT Score: 14   Interventions:H2H  Testing due for pt today? NO  LOS: 0 days Expected length of stay 1 days  Discharge plan: TBD   PCP: Kassie MendsPatrick M Woodward, MD  Transportation needs: Yes    Days before discharge:one day until discharge   Discharge disposition: Rehab    Signed:     Vanice SarahMary West, RN  12/21/2015  7:32 AM

## 2015-12-21 NOTE — Progress Notes (Signed)
CM received an Inpatient Rehab consult. CM met with pt's wife to discuss and to offer choice. She would like a referral to go to [facility]. Cm sent a referral to [facility] in [place]. Vaughan Basta Garren,BSW,ACM-SW

## 2015-12-21 NOTE — Progress Notes (Signed)
Physical Therapy note  12/21/15    Order acknowledged and chart reviewed. Pt discussed in IDT rounds and remains with significant AMS, highly agitated, combative, in restraints. Pt not appropriate for skilled mobility assessment at this time. Will defer and continue to follow to complete PT eval as appropriate.    Thank you,  Cleopatra CedarJaclyn Michels, PT, DPT

## 2015-12-21 NOTE — Consults (Signed)
Consults by Jordan Likes, MD at 12/21/15 1113                Author: Jordan Likes, MD  Service: Cardiology  Author Type: Physician       Filed: 12/21/15 1135  Date of Service: 12/21/15 1113  Status: Signed          Editor: Jordan Likes, MD (Physician)                    Note            Subjective:         Date of  Admission: 12/19/2015  2:57 PM       Admission type:Emergency      [name] [name] is a 82 y.o.  male  With past medical history of CAD s/p bypass surgery in [place] approximately 12 years ago, chronic atrial fibrillation for which he was recently taken off of anticoagulation due to falls,  mild dementia, and chronic systolic heart failure with an EF of approximately 20% who was in his usual state of health until Sunday afternoon when he became confused with garbled speech.  He was seen in the ER and given Ativan and Haldol which apparently  made him quite combative.  He is sleeping at this time, but family states he was lucid earlier this morning and appeared back to his neurological baseline.  Primary team is working up mental status change and MRI is pending.  We are consulted due to a  mildly elevated troponin.  Per family, he has not recently complained of chest pain or shortness of breath.  He has mild LE swelling which wife states is at his baseline.  He and his wife recently moved from [facility] to [place] to be closer to their daughter.        Patient Active Problem List           Diagnosis  Date Noted         ?  Chronic systolic heart failure (HCC)  16/10/9602     ?  Acute encephalopathy  12/19/2015     ?  Dementia  12/19/2015     ?  Mild dehydration  12/19/2015     ?  CAD (coronary artery disease)  12/19/2015     ?  Chronic a-fib (HCC)  12/19/2015     ?  Dislocation, hip (HCC)  12/19/2015     ?  Hard of hearing  12/19/2015         ?  Cardiomyopathy (HCC)  12/19/2015         Kassie Mends, MD     Past Medical History:        Diagnosis  Date         ?  CAD (coronary artery disease)        ?  Ill-defined condition            MI 14 years ago          ?  Ill-defined condition            a fib           Past Surgical History:         Procedure  Laterality  Date          ?  HX ORTHOPAEDIC              left hip permantly dislocated  Allergies        Allergen  Reactions         ?  Codeine  Nausea Only         ?  Penicillins  Shortness of Breath         History reviewed. No pertinent family history.      Current Facility-Administered Medications          Medication  Dose  Route  Frequency           ?  levoFLOXacin (LEVAQUIN) 250 mg in D5W IVPB   250 mg  IntraVENous  Q24H     ?  aspirin delayed-release tablet 81 mg   81 mg  Oral  DAILY     ?  sodium chloride (NS) flush 5-10 mL   5-10 mL  IntraVENous  Q8H     ?  sodium chloride (NS) flush 5-10 mL   5-10 mL  IntraVENous  PRN     ?  ondansetron (ZOFRAN) injection 4 mg   4 mg  IntraVENous  Q4H PRN           ?  heparin (porcine) injection 5,000 Units   5,000 Units  SubCUTAneous  Q12H            Prior to Admission Medications:     Prior to Admission medications             Medication  Sig  Start Date  End Date  Taking?  Authorizing Provider            BABY ASPIRIN PO  Take 81 mg by mouth.      Yes  Phys Other, MD     simvastatin (ZOCOR) 40 mg tablet  Take 40 mg by mouth nightly.      Yes  Phys Other, MD     omeprazole (PRILOSEC) 20 mg capsule  Take 20 mg by mouth daily.      Yes  Phys Other, MD     magnesium oxide (MAG-OX) 400 mg tablet  Take 400 mg by mouth daily.      Yes  Phys Other, MD     potassium chloride SR (KLOR-CON 10) 10 mEq tablet  Take 10 mEq by mouth two (2) times a day.      Yes  Phys Other, MD            furosemide (LASIX) 20 mg tablet  Take 20 mg by mouth daily.      Yes  Phys Other, MD            Review of Symptoms:   Unobtainable due to mental status         Subjective:      24 hr VS reviewed, overall VSSAF   Temp (24hrs), Avg:97.7 ??F (36.5 ??C), Min:97.4 ??F (36.3 ??C), Max:98.2 ??F (36.8 ??C)          Patient Vitals for the past 8  hrs:     Pulse      12/21/15 1100  66      12/21/15 0700  84            Patient Vitals for the past 8 hrs:     Resp      12/21/15 1100  16      12/21/15 0700  17            Patient Vitals for the past 8 hrs:     BP      12/21/15  1100  121/75      12/21/15 0700  117/74                     Intake/Output Summary (Last 24 hours) at 12/21/15 1113  Last data filed at 12/21/15 0400        Gross per 24 hour     Intake                 0 ml     Output               500 ml     Net              -500 ml              Physical Exam (complete single organ system exam)      Cons: The patient is sleeping and not arousable at this time   Neck: Flat JVP without appreciable HJR.   Resp: Normal respiratory effort with clear lungs bilaterally.    CV: irregularly irregular rate and rhythm.   PMI not palpated. Normal S1,S2   No gallop or rubs appreciated.  No murmur apprciated.   Intact carotid upstroke bilaterally without appreciated bruits.   Abdominal aorta not palpated; no abdominal bruit noted.   Normal femoral pulses without bruits.   Intact pedal pulses.      Mild bilateral peripheral edema   GI: No abd mass noted, soft; no organomegaly noted.  Bowel sounds present.    Muscular:  No significant kyphosis.  Strength WNL for age.   Ext: No cyanosis, clubbing, or stigmata of peripheral embolization.    Derm: No ulcers or stasis dermatitis of lower extremities.    Neuro: Lethargic          Cardiographics      Telemetry: a fib with controlled rate      Labs:      Recent Results (from the past 24 hour(s))     TROPONIN I          Collection Time: 12/20/15  4:41 PM         Result  Value  Ref Range            Troponin-I, Qt.  0.36 (H)  <0.05 ng/mL       TROPONIN I          Collection Time: 12/20/15  9:47 PM         Result  Value  Ref Range            Troponin-I, Qt.  0.54 (H)  <0.05 ng/mL       TROPONIN I          Collection Time: 12/21/15  6:15 AM         Result  Value  Ref Range            Troponin-I, Qt.  0.47 (H)  <0.05 ng/mL            Assessment/Plan:      1.  Elevated troponin:  Minimally elevated troponin in 82 yo male with known CAD s/p bypass surgery 12 years ago.  No complaints per family of chest pain, shortness of breath.  Continue medical management at this time with ASA, statin.  He has not been  on a beta blocker, presumably due to hypotension.  Will plan to follow for ongoing cardiac card as an outpatient.      2.  A fib:  Chronic.  He has had multiple recent falls and thus anticoagulation was discontinued.  Cont ASA at this time.  Rate controlled.      3.  Altered mental status:  Workup per primary team/neurology.      Jordan LikesAalya Bradee Common, MD

## 2015-12-22 ENCOUNTER — Inpatient Hospital Stay: Admit: 2015-12-22 | Payer: MEDICARE | Primary: Family Medicine

## 2015-12-22 MED ORDER — BALSAM PERU-CASTOR OIL 87 MG-788 MG/GRAM TOPICAL OINTMENT
87-788 mg/gram | Freq: Three times a day (TID) | CUTANEOUS | Status: DC
Start: 2015-12-22 — End: 2015-12-25
  Administered 2015-12-22 – 2015-12-25 (×7): via TOPICAL

## 2015-12-22 MED FILL — LEVOFLOXACIN IN D5W 250 MG/50 ML IV PIGGY BACK: 250 mg/50 mL | INTRAVENOUS | Qty: 50

## 2015-12-22 MED FILL — VENELEX 87 MG-788 MG/GRAM TOPICAL OINTMENT: 87-788 mg/gram | CUTANEOUS | Qty: 60

## 2015-12-22 MED FILL — ASPIRIN 81 MG TAB, DELAYED RELEASE: 81 mg | ORAL | Qty: 1

## 2015-12-22 MED FILL — NORMAL SALINE FLUSH 0.9 % INJECTION SYRINGE: INTRAMUSCULAR | Qty: 10

## 2015-12-22 MED FILL — HEPARIN (PORCINE) 5,000 UNIT/ML IJ SOLN: 5000 unit/mL | INTRAMUSCULAR | Qty: 1

## 2015-12-22 NOTE — Progress Notes (Signed)
Hospitalist Progress Note  Teofilo Pod, MD  Answering service: [phone] OR [phone] from in house phone  Cell: [phone]      Date of Service:  12/22/2015  NAME:  [name] [name]  DOB:  1920-05-18  MRN:  [id number]      Admission Summary:     Patient is a 82 year old male with past medical history of CAD s/p CABG, cardiomyopathy with EF of 20%, chronic atrial fibrillation, dementia, chronic hip dislocation, very hard on hearing who presented to the ER   Via EMS with the complaint of confusion, garbled speech and generalized body weakness.    Interval history / Subjective:      F/u for altered mental status.  Patient is very hard on hearing.  The family claims that the patient is even better than yesterday     Assessment & Plan:     Altered mental status likely secondary to metabolic encephalopathy vs acute stroke.  This is more likely due to metabolic encephalopathy in the setting of dementia  Initial CT head was negative. Carotid dopplers unremarkable  Echo: EF of 20- 25% with severe diffuse hypokinesis with distinct regional wall motion abnormalities.  -MRI not yet done due to combativeness of patient but not sure if it is going to help in the treatment as now the patient looks much better  - Put on his hearing aids to in order to reduce sensory impairments.  - Continue supportive care for now  - Avoid benzodiazepine's and benadryl  -Appreciate discussion with Neurology. I agree with her that getting MRI would unlikely change the management. But the family has spoken to other physicians including PMR and now the family is leaning towards getting MRI. Told them I am not opposed to it and if they decide about getting one, they can let me know.  -But certainly we should not delay in sending him to rehab. The family agrees    Chronic Ischemic cardiomyopathy: NYHA class unable to be determined  Echo with EF 20-25%  Hold Lasix.   Fluid restriction < 1800 cc/ day.  Continue aspirin, mag-oxide and zocor  Daily weight  -Appreciate cardiology    Dementia with acute behavioral disturbance  Supportive care/one to one observation    Hyponatremia: Fluid restriction to 1800 cc/day  monitor    Possible left lower lobe PNA:   -As evidenced by small left lower lobe infiltrate on CXR  On LVQ but don't think that the patient has pneumonia  No fever or leucocytosis  -Will stop LVQ today    CAD s/p CABG x 6.  Resume home aspirin/statin    Chronic atrial fibrillation; Now rate controlled  Continue aspirin.    Elevated troponin: obtain 2 more set  EKG afib with non specific T wave abnormalities    Hypomagnesemia: replete with magnesium sulfate  Keep mag > 2 and K > 4    Cardiac diet    PT/OT inpatient rehab, CM informed    Code status: DNR  DVT prophylaxis: Lovenox    Plan: MRI if family wants and then likely discharge tomorrow if the patient remains sitter free    Care Plan discussed with: Patient/Family and Nurse  Disposition: TBD      Hospital Problems  Date Reviewed: 2016/01/15          Codes Class Noted POA    Altered mental state ICD-10-CM: R41.82  ICD-9-CM: 780.97  12/21/2015 Unknown        * (Principal)Acute encephalopathy ICD-10-CM: G93.40  ICD-9-CM: 348.30  12/19/2015 Unknown        Dementia (Chronic) ICD-10-CM: F03.90  ICD-9-CM: 294.20  12/19/2015 Yes        Mild dehydration ICD-10-CM: E86.0  ICD-9-CM: 276.51  12/19/2015 Yes        CAD (coronary artery disease) (Chronic) ICD-10-CM: I25.10  ICD-9-CM: 414.00  12/19/2015 Yes        Chronic a-fib (HCC) (Chronic) ICD-10-CM: I48.2  ICD-9-CM: 427.31  12/19/2015 Yes        Dislocation, hip (HCC) (Chronic) ICD-10-CM: V95.638VS73.006A  ICD-9-CM: 835.00  12/19/2015 Unknown        Hard of hearing (Chronic) ICD-10-CM: H91.90  ICD-9-CM: 389.9  12/19/2015 Yes        Cardiomyopathy (HCC) (Chronic) ICD-10-CM: I42.9  ICD-9-CM: 425.4  12/19/2015 Unknown                Review of Systems:   Pertinent items are noted in HPI.        Vital Signs:    Last 24hrs VS reviewed since prior progress note. Most recent are:  Visit Vitals   ??? BP 158/77 (BP 1 Location: Left arm, BP Patient Position: At rest)   ??? Pulse (!) 106   ??? Temp 97.5 ??F (36.4 ??C)   ??? Resp 25   ??? Ht 6\' 1"  (1.854 m)   ??? Wt 84.5 kg (186 lb 4.8 oz)   ??? SpO2 100%   ??? BMI 24.58 kg/m2         Intake/Output Summary (Last 24 hours) at 12/22/15 1605  Last data filed at 12/22/15 0441   Gross per 24 hour   Intake                0 ml   Output              150 ml   Net             -150 ml        Physical Examination:             Constitutional:  No acute distress, cooperative, pleasant. Very hard of hearing. Elderly   ENT:  Oral mucous moist, oropharynx benign. Neck supple,    Resp:  CTA bilaterally. No wheezing/rhonchi/rales. No accessory muscle use   CV:  Irregularly irregular no murmurs, gallops, rubs    GI:  Soft, non distended, non tender. normoactive bowel sounds, no hepatosplenomegaly     Musculoskeletal:  No edema, warm, 2+ pulses throughout    Neurologic:  Moves all extremities.  Awake. Orientation times zero            Data Review:          Labs:     Recent Labs      12/20/15   0620   WBC  8.5   HGB  12.4   HCT  36.8   PLT  247     Recent Labs      12/20/15   0620   NA  133*   K  3.8   CL  97   CO2  26   BUN  18   CREA  0.87   GLU  91   CA  8.7   MG  1.5*     Recent Labs      12/20/15   0620   SGOT  46*   ALT  21   AP  61   TBILI  1.6*   TP  7.2  ALB  3.7   GLOB  3.5     No results for input(s): INR, PTP, APTT in the last 72 hours.    No lab exists for component: INREXT, INREXT   No results for input(s): FE, TIBC, PSAT, FERR in the last 72 hours.   Lab Results   Component Value Date/Time    Folate 25.9 12/19/2015 03:22 PM      No results for input(s): PH, PCO2, PO2 in the last 72 hours.  Recent Labs      12/21/15   0615  12/20/15   2147  12/20/15   1641   TROIQ  0.47*  0.54*  0.36*     No results found for: CHOL, CHOLX, CHLST, CHOLV, HDL, LDL, LDLC, DLDLP,  TGLX, TRIGL, TRIGP, CHHD, CHHDX  Lab Results   Component Value Date/Time    Glucose (POC) 96 12/19/2015 03:17 PM     Lab Results   Component Value Date/Time    Color YELLOW/STRAW 12/19/2015 03:59 PM    Appearance CLEAR 12/19/2015 03:59 PM    Specific gravity 1.028 12/19/2015 03:59 PM    pH (UA) 7.0 12/19/2015 03:59 PM    Protein 100 12/19/2015 03:59 PM    Glucose NEGATIVE  12/19/2015 03:59 PM    Ketone NEGATIVE  12/19/2015 03:59 PM    Bilirubin NEGATIVE  12/19/2015 03:59 PM    Urobilinogen 0.2 12/19/2015 03:59 PM    Nitrites NEGATIVE  12/19/2015 03:59 PM    Leukocyte Esterase NEGATIVE  12/19/2015 03:59 PM    Epithelial cells FEW 12/19/2015 03:59 PM    Bacteria NEGATIVE  12/19/2015 03:59 PM    WBC 0-4 12/19/2015 03:59 PM    RBC 0-5 12/19/2015 03:59 PM         Medications Reviewed:     Current Facility-Administered Medications   Medication Dose Route Frequency   ??? balsam peru-castor oil (VENELEX) [id number] mg/gram ointment   Topical Q8H   ??? levoFLOXacin (LEVAQUIN) 250 mg in D5W IVPB  250 mg IntraVENous Q24H   ??? aspirin delayed-release tablet 81 mg  81 mg Oral DAILY   ??? sodium chloride (NS) flush 5-10 mL  5-10 mL IntraVENous Q8H   ??? sodium chloride (NS) flush 5-10 mL  5-10 mL IntraVENous PRN   ??? ondansetron (ZOFRAN) injection 4 mg  4 mg IntraVENous Q4H PRN   ??? heparin (porcine) injection 5,000 Units  5,000 Units SubCUTAneous Q12H     ______________________________________________________________________  EXPECTED LENGTH OF STAY: 3d 9h  ACTUAL LENGTH OF STAY:          1                 Teofilo PodPunit Fani Rotondo, MD

## 2015-12-22 NOTE — Other (Signed)
IDR/SLIDR Summary          Patient: [name] [name] MRN: [id number]    Age: 82 y.o.     Birthdate: 1920/04/14 Room/Bed: 677/01   Admit Diagnosis: Acute encephalopathy  Altered mental state  Principal Diagnosis: Acute encephalopathy   Goals: safety  Readmission: NO  Quality Measure: CHF  VTE Prophylaxis: Chemical  Influenza Vaccine screening completed? YES  Pneumococcal Vaccine screening completed? NO  Mobility needs: Yes   Nutrition plan:Yes  Consults:P.T and O.T.    Financial concerns:No  Escalated to CM? NO  RRAT Score: 14   Interventions:H2H  Testing due for pt today? NO  LOS: 1 days Expected length of stay 1 days  Discharge plan: TBD   PCP: Kassie MendsPatrick M Woodward, MD  Transportation needs: Yes    Days before discharge:one day until discharge   Discharge disposition: Rehab    Signed:     Vanice SarahMary West, RN  12/22/2015  3:58 AM

## 2015-12-22 NOTE — Consults (Signed)
Neuro consult completed, dictated note to follow, hopefully.....  Pt with acute change in behavior/language deficits on 12/18/15.  Has Afib and CHF with diffuse hypokinesis and akinesis of apical anterior, septal, and apex walls.  Off coumadin x 1 months due to frequent falls. He is much better today.  On exam follows all commands, tells me his name, believes he is in [place], knows a male in the family lives in [place], but isn't certain if it is daughter or granddaughter, able to name, no focal deficits, absent reflexes, toes down going.  Strong suspicion for embolic stroke.  Discussed with wife and she would like to proceed with MRI today and he appears much calmer and is following commands, hopefully he will tolerate this.  I do not believe that it will change management if he continues to walk at home, but his wife would like "to get to the bottom of this."  (Of note, this is a direct contradiction to what the wife d/w Dr. Goel yesterday in the presence of her SIL, and wife repeated herself in a short period of time while I was seeing him.)

## 2015-12-22 NOTE — Other (Deleted)
Pneumonia  was noted in the H/P and the 12/12 HOSP PN however, it is also noted in the 12/12 HOSP PN " Will consult neurology as I don't think the patient has Pneumonia, UTI" Please  clarify if this condition was:    Treated & resolved  Ongoing/Improving  Ruled Out  Other Explanation of the Clinical Findings  Unable to   Determine (no explanation for clinical findings)    Please clarify and document your clinical opinion in the progress notes and discharge summary including the definitive and/or presumptive diagnosis, (suspected or probable), related to the above clinical findings. Please include clinical findings supporting your diagnosis.    Thank You  Rayann HemanJackie Maitland CDMP  [email or web address]  [phone]

## 2015-12-22 NOTE — Progress Notes (Signed)
Problem: Falls - Risk of  Goal: *Absence of Falls  Document Schmid Fall Risk and appropriate interventions in the flowsheet.   Outcome: Progressing Towards Goal  Fall Risk Interventions:  Mobility Interventions: Assess mobility with egress test, Bed/chair exit alarm, Communicate number of staff needed for ambulation/transfer, OT consult for ADLs, Patient to call before getting OOB, PT Consult for mobility concerns, PT Consult for assist device competence, Strengthening exercises (ROM-active/passive), Utilize walker, cane, or other assitive device, Utilize gait belt for transfers/ambulation    Mentation Interventions: Adequate sleep, hydration, pain control, Bed/chair exit alarm, Door open when patient unattended, Evaluate medications/consider consulting pharmacy, Eyeglasses and hearing aids, Familiar objects from home, Family/sitter at bedside, Increase mobility, More frequent rounding, Reorient patient, Room close to nurse's station, Self-releasing belt, Toileting rounds, Update white board    Medication Interventions: Assess postural VS orthostatic hypotension, Bed/chair exit alarm, Evaluate medications/consider consulting pharmacy, Patient to call before getting OOB, Teach patient to arise slowly, Utilize gait belt for transfers/ambulation    Elimination Interventions: Call light in reach, Bed/chair exit alarm, Patient to call for help with toileting needs, Toilet paper/wipes in reach, Toileting schedule/hourly rounds, Urinal in reach    History of Falls Interventions: Bed/chair exit alarm, Consult care management for discharge planning, Door open when patient unattended, Evaluate medications/consider consulting pharmacy, Investigate reason for fall, Room close to nurse's station, Utilize gait belt for transfer/ambulation

## 2015-12-22 NOTE — Progress Notes (Signed)
12/22/15 1954   Vitals   Temp 98 ??F (36.7 ??C)   Temp Source Oral   Pulse (Heart Rate) (!) 101   Heart Rate Source Monitor   Resp Rate 24   O2 Sat (%) 96 %   Level of Consciousness Alert   BP 155/67   MAP (Calculated) 96   BP 1 Location Left arm   BP 1 Method Automatic   BP Patient Position Post activity   Cardiac Rhythm A Fib   MEWS Score 3   Pain 1   Pain Scale 1 Numeric (0 - 10)   Pain Intensity 1 0   Patient Stated Pain Goal 0   Oxygen Therapy   O2 Device Room air   Patient Observation   Repositioned Head of bed elevated (degrees);Heels off loaded   Patient Turned Turn on left side   Observations returned from MRI   MEWS OF 3.  MD aware of HR, post activity.

## 2015-12-22 NOTE — Consults (Signed)
[place] [facility]  CONSULTATION    Name:[name], [name]  MR#: [id number]  DOB: 03/12/1920  ACCOUNT #: [id number]   DATE OF SERVICE: 12/22/2015    HISTORY:  This is a 82 year old right-handed male who was admitted on 12/19/2015 after family reported altered mental status since 12/18/2015. The patient was noted to have word finding difficulty following commands and was agitated and combative in the emergency department requiring Haldol and benzodiazepines. His family noted that typically he ambulates very slowly with a walker at home due to left hip that is permanently dislocated, however, he was unable to walk beginning the day before. He also complained of a headache and some nausea. The patient's son-in-law notes that the patient has had decline over the last year with falls, which, according to his wife, prompted move from [place] to [place] 1 month ago.  The patient has atrial fibrillation and congestive heart failure with an EF of 20 to 25% with diffuse hypokinesis and akinesis of the apical anterior septal and apex walls seen on echocardiogram here and had been on Coumadin up to 1 month ago; it was stopped due to his falls. He was switched to aspirin 81 mg a day. His wife notes that he suddenly could not figure out how to take his pills, had difficulty operating a lift chair he normally sits in, and had complained of dizziness the night before his symptoms started. His workup for underlying metabolic or toxic etiology of his mental status changes has been unrevealing. UA did not even prompt a culture. Chest x-ray revealed small left lower lobe infiltrate and wife notes he had a case of pneumonia recently; however, on repeat chest x-ray yesterday, it notes pulmonary edema has resolved. There is no pneumonia or pneumothorax seen.  He has improved lung aeration, so he has been stared on Levaquin, but it is not  felt that he has true pneumonia at this time.  The patient is doing much better today, was able to eat this morning.     PAST MEDICAL HISTORY:   1. Coronary artery disease status post MI and CABG x6 vessels.   2. Congestive heart failure with an EF of 20 to 25%.   3. Atrial fibrillation.   4. Dementia.   5. Left hip permanently dislocated.   6. Severe hearing loss.   7. Osteoarthritis of his knees.   8. DNR.     REVIEW OF SYSTEMS: Limited due to patient factors.     MEDICATIONS:     At home:   1. Aspirin 81 mg a day.   2. Zocor.   3. Prilosec.   4. Mag-Ox.   5. KCL.   6. Lasix.     Here: Levaquin has been added.     ALLERGY: CODEINE AND PENICILLIN.     SOCIAL HISTORY: He is married, lives in [place] with his wife. He quit smoking over 50 years ago, occasionally drinks alcohol very rarely.     FAMILY HISTORY: Unable to obtain from the patient at this time.     PHYSICAL EXAM:   VITAL SIGNS: BMI is 24.5 and his blood pressure on presentation was 170/101, currently 130/65. Pulse 71, respiratory rate 19, saturating 100% on room air. Temperature is 97.6.   GENERAL: He is a thin, elderly male, lying in bed asleep, easily awakened.   HEART: Regular irregular rhythm. No murmur appreciated. Carotids are 2+. No bruits.   EXTREMITIES: Warm, no edema. He has 2+ radial pulses. He has  scarring down his legs from previous vein harvesting.   NEUROLOGICAL: Mental status: He is alert. He is oriented to person. He tells me he is in [place]. When reminded that he is in [place] and asked who he knows in [place], he knows there is a male relative here but cannot recall if it is his granddaughter or not; it is actually his daughter. He is able to follow all commands without difficulty and names. Speech is not dysarthric. He is pleasant and cooperative. Cranial nerves: No asymmetry or ptosis. Extraocular eye movements intact without nystagmus seen. Pupils equally round, reactive. Tongue midline. Palate elevated  symmetrically. Hearing is greatly diminished to normal conversation. Motor: He has 5/5 bilateral grip, biceps strength. No pronator drift. No tremors bilateral hip flexors. Sensory exam was not fully explored. Appeared to be intact to light touch throughout. Reflexes I was unable to elicit. Toes are downgoing. His coordination was intact finger-to-nose bilaterally. Gait not attempted at this time. Will need assistance and walker.     STUDIES AND REPORTS: His CBC was unremarkable. CMP had a sodium of 134 at presentation now with an AST of 46 and the total bilirubin of 1.6. Magnesium is low at 1.5. His ammonia, TSH, RPR, B12, vitamin D are all normal. CT of the head at presentation showed motion artifact. Carotid Dopplers less than 50% stenosis.     ASSESSMENT AND PLAN: This is a 82 year old right-handed gentleman with sudden onset of contusion and language deficits with atrial fibrillation and congestive heart failure, off Coumadin x1 month due to recent falls who, on exam, is pleasant, appropriate, has clear memory impairment, but is able to name and follow all commands. Remainder of his exam is nonfocal.  Suspect this was embolic stroke. No obvious other cause for a toxic or metabolic encephalopathy. Discussed this possibility with his wife and the fact that I am not sure getting an MRI is going to change our management but his wife told me that she is very interested in finding out exactly what happened. He appears to be more appropriate today and likely better able to tolerate an MRI.  We will attempt today. Would continue to avoid sedating medications. If patient remains non-ambulatory, then would consider restarting anticoagulation, otherwise, continue aspirin. Will follow up after MRI. This was discussed with Dr. Maryjean KaGoel, caring for the patient.      [name], MD       MR / [email or web address]  D: 12/22/2015 11:03     T: 12/22/2015 11:52  JOB #: [id number]

## 2015-12-22 NOTE — Progress Notes (Signed)
Problem: Falls - Risk of  Goal: *Absence of Falls  Document Schmid Fall Risk and appropriate interventions in the flowsheet.   Outcome: Progressing Towards Goal  Fall Risk Interventions:  Mobility Interventions: Communicate number of staff needed for ambulation/transfer, OT consult for ADLs, Patient to call before getting OOB, PT Consult for mobility concerns, PT Consult for assist device competence, Strengthening exercises (ROM-active/passive)    Mentation Interventions: Adequate sleep, hydration, pain control, Door open when patient unattended, Evaluate medications/consider consulting pharmacy    Medication Interventions: Bed/chair exit alarm, Evaluate medications/consider consulting pharmacy    Elimination Interventions: Call light in reach, Patient to call for help with toileting needs, Toileting schedule/hourly rounds, Urinal in reach    History of Falls Interventions: Consult care management for discharge planning, Bed/chair exit alarm, Door open when patient unattended, Investigate reason for fall, Room close to nurse's station, Evaluate medications/consider consulting pharmacy

## 2015-12-22 NOTE — Progress Notes (Signed)
Met with patient's daughter, Loletha Carrow who consents to prepare for a PLAN B if patient is not accepted by [facility].  Referral to be made to:    [facility] [place] [facility] in [place] provided on different levels of care during this interaction.      Berline Chough, RN

## 2015-12-22 NOTE — Progress Notes (Signed)
Bedside and Verbal shift change report given to Gigi GinPeggy, RN (oncoming nurse) by Vernona RiegerLaura, RN (offgoing nurse). Report included the following information SBAR, Kardex, Intake/Output, Recent Results and Cardiac Rhythm Afib.

## 2015-12-22 NOTE — Wound Image (Signed)
Wound Care Note:     New consult placed by nurse request for abrasion left forearm; erythema blanchable    Chart shows:  Admitted for acute encephalopathy   Past Medical History:   Diagnosis Date   ??? CAD (coronary artery disease)    ??? Ill-defined condition     MI 14 years ago    ??? Ill-defined condition     a fib     WBC = 8.5 on 12/20/15  Admitted from home    Assessment:   Patient is alert and talking, incontinent with moderate assistance needed in repositioning.    Bed: Total Care- requested a [facility] bed   Patient wearing briefs for incontinence   Diet: Cardiac regular  Patient reports no pain    Bilateral heels and sacral skin intact and with blanchable erythema.    1. Patient has a pink, non-blanchable area on his left buttock that measures 5 cm x 1.5 cm, no open area, no drainage, peri-wound is intact.    2.  Left wrist has a hyperpigmented area from patient struggling while restraints are in place.  No open area noted.    Spoke with Dr. Maryjean KaGoel; wound care orders obtained.    Patient repositioned on right side.  Heels offloaded on pillows.     Recommendations:    Left buttock, sacrum and left wrist- Every 8 hours liberally apply Venelex ointment.    Skin Care & Pressure Prevention:  Minimize layers of linen/pads under patient to optimize support surface.    Turn/reposition approximately every 2 hours and offload heels.  Manage incontinence / promote continence     Discussed above plan with patient & Vernona RiegerLaura, RN    Transition of Care: Plan to follow as needed while admitted to hospital.    Synetta FailAnita "Magnus IvanSue" Justice, RN, BSN, Wound Care Nurse  office [phone]  pager [phone] or call operator to page

## 2015-12-22 NOTE — Progress Notes (Signed)
Cardiology Progress Note  12/22/2015     Admit Date: 12/19/2015  Admit Diagnosis: Acute encephalopathy  Altered mental state  CC: none currently    Assessment/Plan:    1.  Chronic a fib:  Rate well controlled.  He has not been on rate controlling agents in the past.  Apparently bradycardic with beta blockers.  Cont ASA only.  No anticoagulation due to falls.    2.  CHF:  Appears compensated.  Continue to monitor I/Os    3.  CAD:  Stable.  Troponin likely demand ischemia.  Cont ASA, STATin    Will see as outpatient in f/u in 3-4 weeks.  Please call with further questions.    Subjective:      Despina HickVernon Karczewski up and alert this morning.  States he is feeling well without complaints.      Objective:    Physical Exam:  Overall VSSAF;    Visit Vitals   ??? BP 130/65   ??? Pulse 71   ??? Temp 97.6 ??F (36.4 ??C)   ??? Resp 19   ??? Ht 6\' 1"  (1.854 m)   ??? Wt 84.5 kg (186 lb 4.8 oz)   ??? SpO2 100%   ??? BMI 24.58 kg/m2     Temp (24hrs), Avg:97.8 ??F (36.6 ??C), Min:97.6 ??F (36.4 ??C), Max:98 ??F (36.7 ??C)    Patient Vitals for the past 8 hrs:   Pulse   12/22/15 0700 71   12/22/15 0300 84    Patient Vitals for the past 8 hrs:   Resp   12/22/15 0700 19   12/22/15 0300 19    Patient Vitals for the past 8 hrs:   BP   12/22/15 0700 130/65   12/22/15 0300 125/61      12/11 1901 - 12/13 0700  In: -   Out: 650 [Urine:650]      General Appearance: Well developed, well nourished, no acute distress.   Ears/Nose/Mouth/Throat:   Normal MM; anicteric.     JVP: WNL   Resp:   Lungs clear to auscultation bilaterally.  Nl resp effort.   Cardiovascular:  Irregularly irregular S1, S2 normal, no new murmur. No gallop or rub.   Abdomen:   Soft, non-tender, bowel sounds are present.   Extremities: Mild edema bilaterally.    Skin:  Neuro: Warm and dry.  A/O x3, grossly nonfocal                         Data Review:        Labs: No results found for this or any previous visit (from the past 24 hour(s)).   Current medications reviewed       Jordan LikesAalya Saylor Murry, MD

## 2015-12-22 NOTE — Consults (Signed)
Neuro consult completed, dictated note to follow, hopefully.....  Pt with acute change in behavior/language deficits on 12/18/15.  Has Afib and CHF with diffuse hypokinesis and akinesis of apical anterior, septal, and apex walls.  Off coumadin x 1 months due to frequent falls. He is much better today.  On exam follows all commands, tells me his name, believes he is in [place], knows a male in the family lives in [place], but isn't certain if it is daughter or granddaughter, able to name, no focal deficits, absent reflexes, toes down going.  Strong suspicion for embolic stroke.  Discussed with wife and she would like to proceed with MRI today and he appears much calmer and is following commands, hopefully he will tolerate this.  I do not believe that it will change management if he continues to walk at home, but his wife would like "to get to the bottom of this."  (Of note, this is a direct contradiction to what the wife d/w Dr. Maryjean KaGoel yesterday in the presence of her SIL, and wife repeated herself in a short period of time while I was seeing him.)

## 2015-12-22 NOTE — Consults (Signed)
[facility]    Name:[name] [name], [name] [name]  MR#: [id number]  DOB: 01/01/1921  ACCOUNT #: [id number]   DATE OF SERVICE: 12/22/2015    HISTORY:  This is a 82 year old right-handed male who was admitted on 12/19/2015 after family reported altered mental status since 12/18/2015. The patient was noted to have word finding difficulty following commands and was agitated and combative in the emergency department requiring Haldol and benzodiazepines. His family noted that typically he ambulates very slowly with a walker at home due to left hip that is permanently dislocated, however, he was unable to walk beginning the day before. He also complained of a headache and some nausea. The patient's son-in-law notes that the patient has had decline over the last year with falls, which, according to his wife, prompted move from [place] to [place] 1 month ago.  The patient has atrial fibrillation and congestive heart failure with an EF of 20 to 25% with diffuse hypokinesis and akinesis of the apical anterior septal and apex walls seen on echocardiogram here and had been on Coumadin up to 1 month ago; it was stopped due to his falls. He was switched to aspirin 81 mg a day. His wife notes that he suddenly could not figure out how to take his pills, had difficulty operating a lift chair he normally sits in, and had complained of dizziness the night before his symptoms started. His workup for underlying metabolic or toxic etiology of his mental status changes has been unrevealing. UA did not even prompt a culture. Chest x-ray revealed small left lower lobe infiltrate and wife notes he had a case of pneumonia recently; however, on repeat chest x-ray yesterday, it notes pulmonary edema has resolved. There is no pneumonia or pneumothorax seen.  He has improved lung aeration, so he has been stared on Levaquin, but it is not felt that he has true pneumonia at this time.  The patient is doing  much better today, was able to eat this morning.     PAST MEDICAL HISTORY:   1. Coronary artery disease status post MI and CABG x6 vessels.   2. Congestive heart failure with an EF of 20 to 25%.   3. Atrial fibrillation.   4. Dementia.   5. Left hip permanently dislocated.   6. Severe hearing loss.   7. Osteoarthritis of his knees.   8. DNR.     REVIEW OF SYSTEMS: Limited due to patient factors.     MEDICATIONS:     At home:   1. Aspirin 81 mg a day.   2. Zocor.   3. Prilosec.   4. Mag-Ox.   5. KCL.   6. Lasix.     Here: Levaquin has been added.     ALLERGY: CODEINE AND PENICILLIN.     SOCIAL HISTORY: He is married, lives in [place] with his wife. He quit smoking over 50 years ago, occasionally drinks alcohol very rarely.     FAMILY HISTORY: Unable to obtain from the patient at this time.     PHYSICAL EXAM:   VITAL SIGNS: BMI is 24.5 and his blood pressure on presentation was 170/101, currently 130/65. Pulse 71, respiratory rate 19, saturating 100% on room air. Temperature is 97.6.   GENERAL: He is a thin, elderly male, lying in bed asleep, easily awakened.   HEART: Regular irregular rhythm. No murmur appreciated. Carotids are 2+. No bruits.   EXTREMITIES: Warm, no edema. He has 2+ radial pulses. He has  scarring down his legs from previous vein harvesting.   NEUROLOGICAL: Mental status: He is alert. He is oriented to person. He tells me he is in [place]. When reminded that he is in [place] and asked who he knows in [place], he knows there is a male relative here but cannot recall if it is his granddaughter or not; it is actually his daughter. He is able to follow all commands without difficulty and names. Speech is not dysarthric. He is pleasant and cooperative. Cranial nerves: No asymmetry or ptosis. Extraocular eye movements intact without nystagmus seen. Pupils equally round, reactive. Tongue midline. Palate elevated symmetrically. Hearing is greatly diminished to normal conversation. Motor: He has 5/5  bilateral grip, biceps strength. No pronator drift. No tremors bilateral hip flexors. Sensory exam was not fully explored. Appeared to be intact to light touch throughout. Reflexes I was unable to elicit. Toes are downgoing. His coordination was intact finger-to-nose bilaterally. Gait not attempted at this time. Will need assistance and walker.     STUDIES AND REPORTS: His CBC was unremarkable. CMP had a sodium of 134 at presentation now with an AST of 46 and the total bilirubin of 1.6. Magnesium is low at 1.5. His ammonia, TSH, RPR, B12, vitamin D are all normal. CT of the head at presentation showed motion artifact. Carotid Dopplers less than 50% stenosis.     ASSESSMENT AND PLAN: This is a 82 year old right-handed gentleman with sudden onset of contusion and language deficits with atrial fibrillation and congestive heart failure, off Coumadin x1 month due to recent falls who, on exam, is pleasant, appropriate, has clear memory impairment, but is able to name and follow all commands. Remainder of his exam is nonfocal.  Suspect this was embolic stroke. No obvious other cause for a toxic or metabolic encephalopathy. Discussed this possibility with his wife and the fact that I am not sure getting an MRI is going to change our management but his wife told me that she is very interested in finding out exactly what happened. He appears to be more appropriate today and likely better able to tolerate an MRI.  We will attempt today. Would continue to avoid sedating medications. If patient remains non-ambulatory, then would consider restarting anticoagulation, otherwise, continue aspirin. Will follow up after MRI. This was discussed with Dr. Maryjean KaGoel, caring for the patient.      Scarlett PrestoMARY Pearlie Lafosse, MD       MR / [email or web address]  D: 12/22/2015 11:03     T: 12/22/2015 11:52  JOB #: [id number]

## 2015-12-23 LAB — GLUCOSE, POC: Glucose (POC): 132 mg/dL — ABNORMAL HIGH (ref 65–100)

## 2015-12-23 MED FILL — ASPIRIN 81 MG TAB, DELAYED RELEASE: 81 mg | ORAL | Qty: 1

## 2015-12-23 MED FILL — HEPARIN (PORCINE) 5,000 UNIT/ML IJ SOLN: 5000 unit/mL | INTRAMUSCULAR | Qty: 1

## 2015-12-23 MED FILL — LEVOFLOXACIN IN D5W 250 MG/50 ML IV PIGGY BACK: 250 mg/50 mL | INTRAVENOUS | Qty: 50

## 2015-12-23 MED FILL — NORMAL SALINE FLUSH 0.9 % INJECTION SYRINGE: INTRAMUSCULAR | Qty: 10

## 2015-12-23 NOTE — Progress Notes (Signed)
Hospitalist Progress Note  Teofilo PodPunit Arville Postlewaite, MD  Answering service: [phone] from in house phone  Cell: [phone]      Date of Service:  12/23/2015  NAME:  [name] [name]  DOB:  [birth year]  MRN:  [id number]      Admission Summary:     Patient is a 82 year old male with past medical history of CAD s/p CABG, cardiomyopathy with EF of 20%, chronic atrial fibrillation, dementia, chronic hip dislocation, very hard on hearing who presented to the ER   Via EMS with the complaint of confusion, garbled speech and generalized body weakness.    Interval history / Subjective:      F/u for altered mental status.  Patient is very hard on hearing.  No new issues  Overnight again was agitated and anxious     Assessment & Plan:     Altered mental status likely secondary to metabolic encephalopathy vs acute stroke.  This is more likely due to metabolic encephalopathy in the setting of dementia  Initial CT head was negative. Carotid dopplers unremarkable  Echo: EF of 20- 25% with severe diffuse hypokinesis with distinct regional wall motion abnormalities.  -MRI not yet done due to combativeness of patient but not sure if it is going to help in the treatment as now the patient looks much better  - Put on his hearing aids to in order to reduce sensory impairments.  - Continue supportive care for now  - Avoid benzodiazepine's and benadryl  -MRI brain 12/13 Moderate chronic microvascular ischemic disease. No acute infarct. Limited by  motion artifact. Wife informed about the results    Chronic Ischemic cardiomyopathy: NYHA class unable to be determined  Echo with EF 20-25%  Hold Lasix.  Fluid restriction < 1800 cc/ day.  Continue aspirin, mag-oxide and zocor  Daily weight  -Appreciate cardiology    Dementia with acute behavioral disturbance  Supportive care/one to one observation  Told the wife that in order for the patient to go to rehab, the patient  has to remain sitter free. Wife agrees on staying in the hospital with the patient tonight    Hyponatremia: Fluid restriction to 1800 cc/day  monitor    Possible left lower lobe PNA:   -As evidenced by small left lower lobe infiltrate on CXR  On LVQ but don't think that the patient has pneumonia  No fever or leucocytosis  -LVQ stopped today    CAD s/p CABG x 6.  Resume home aspirin/statin    Chronic atrial fibrillation; Now rate controlled  Continue aspirin.    Elevated troponin: obtain 2 more set  EKG afib with non specific T wave abnormalities    Hypomagnesemia: replete with magnesium sulfate  Keep mag > 2 and K > 4    Cardiac diet    PT/OT inpatient rehab, CM informed    Code status: DNR  DVT prophylaxis: Lovenox    Plan: The patient is medically stable but has to remain sitter free before discharge to [facility]. Likely discharge tomorrow    Care Plan discussed with: Patient/Family and Nurse  Disposition: as above      Hospital Problems  Date Reviewed: 12/19/2015          Codes Class Noted POA    Altered mental state ICD-10-CM: R41.82  ICD-9-CM: 780.97  12/21/2015 Unknown        * (Principal)Acute encephalopathy ICD-10-CM: G93.40  ICD-9-CM: 348.30  12/19/2015 Unknown        Dementia (  Chronic) ICD-10-CM: F03.90  ICD-9-CM: 294.20  12/19/2015 Yes        Mild dehydration ICD-10-CM: E86.0  ICD-9-CM: 276.51  12/19/2015 Yes        CAD (coronary artery disease) (Chronic) ICD-10-CM: I25.10  ICD-9-CM: 414.00  12/19/2015 Yes        Chronic a-fib (HCC) (Chronic) ICD-10-CM: I48.2  ICD-9-CM: 427.31  12/19/2015 Yes        Dislocation, hip (HCC) (Chronic) ICD-10-CM: X09.604VS73.006A  ICD-9-CM: 835.00  12/19/2015 Unknown        Hard of hearing (Chronic) ICD-10-CM: H91.90  ICD-9-CM: 389.9  12/19/2015 Yes        Cardiomyopathy (HCC) (Chronic) ICD-10-CM: I42.9  ICD-9-CM: 425.4  12/19/2015 Unknown                Review of Systems:   Pertinent items are noted in HPI.       Vital Signs:     Last 24hrs VS reviewed since prior progress note. Most recent are:  Visit Vitals   ??? BP 135/64 (BP 1 Location: Left arm, BP Patient Position: At rest)   ??? Pulse 87   ??? Temp 97.7 ??F (36.5 ??C)   ??? Resp 25   ??? Ht 6\' 1"  (1.854 m)   ??? Wt 83.7 kg (184 lb 8 oz)   ??? SpO2 93%   ??? BMI 24.34 kg/m2         Intake/Output Summary (Last 24 hours) at 12/23/15 1959  Last data filed at 12/23/15 [id number]   Gross per 24 hour   Intake                0 ml   Output              200 ml   Net             -200 ml        Physical Examination:             Constitutional:  No acute distress, cooperative, pleasant. Very hard of hearing. Elderly   ENT:  Oral mucous moist, oropharynx benign. Neck supple,    Resp:  CTA bilaterally. No wheezing/rhonchi/rales. No accessory muscle use   CV:  Irregularly irregular no murmurs, gallops, rubs    GI:  Soft, non distended, non tender. normoactive bowel sounds, no hepatosplenomegaly     Musculoskeletal:  No edema, warm, 2+ pulses throughout    Neurologic:  Moves all extremities.  Awake. Orientation times zero            Data Review:          Labs:     No results for input(s): WBC, HGB, HCT, PLT, HGBEXT, HCTEXT, PLTEXT, HGBEXT, HCTEXT, PLTEXT in the last 72 hours.  No results for input(s): NA, K, CL, CO2, BUN, CREA, GLU, CA, MG, PHOS, URICA in the last 72 hours.  No results for input(s): SGOT, GPT, ALT, AP, TBIL, TBILI, TP, ALB, GLOB, GGT, AML, LPSE in the last 72 hours.    No lab exists for component: AMYP, HLPSE  No results for input(s): INR, PTP, APTT in the last 72 hours.    No lab exists for component: INREXT, INREXT   No results for input(s): FE, TIBC, PSAT, FERR in the last 72 hours.   Lab Results   Component Value Date/Time    Folate 25.9 12/19/2015 03:22 PM      No results for input(s): PH, PCO2, PO2 in the last 72 hours.  Recent Labs  12/21/15   0615  12/20/15   2147   TROIQ  0.47*  0.54*     No results found for: CHOL, CHOLX, CHLST, CHOLV, HDL, LDL, LDLC, DLDLP, TGLX, TRIGL, TRIGP, CHHD, CHHDX   Lab Results   Component Value Date/Time    Glucose (POC) 132 12/23/2015 12:10 PM    Glucose (POC) 96 12/19/2015 03:17 PM     Lab Results   Component Value Date/Time    Color YELLOW/STRAW 12/19/2015 03:59 PM    Appearance CLEAR 12/19/2015 03:59 PM    Specific gravity 1.028 12/19/2015 03:59 PM    pH (UA) 7.0 12/19/2015 03:59 PM    Protein 100 12/19/2015 03:59 PM    Glucose NEGATIVE  12/19/2015 03:59 PM    Ketone NEGATIVE  12/19/2015 03:59 PM    Bilirubin NEGATIVE  12/19/2015 03:59 PM    Urobilinogen 0.2 12/19/2015 03:59 PM    Nitrites NEGATIVE  12/19/2015 03:59 PM    Leukocyte Esterase NEGATIVE  12/19/2015 03:59 PM    Epithelial cells FEW 12/19/2015 03:59 PM    Bacteria NEGATIVE  12/19/2015 03:59 PM    WBC 0-4 12/19/2015 03:59 PM    RBC 0-5 12/19/2015 03:59 PM         Medications Reviewed:     Current Facility-Administered Medications   Medication Dose Route Frequency   ??? balsam peru-castor oil (VENELEX) [id number] mg/gram ointment   Topical Q8H   ??? aspirin delayed-release tablet 81 mg  81 mg Oral DAILY   ??? sodium chloride (NS) flush 5-10 mL  5-10 mL IntraVENous Q8H   ??? sodium chloride (NS) flush 5-10 mL  5-10 mL IntraVENous PRN   ??? ondansetron (ZOFRAN) injection 4 mg  4 mg IntraVENous Q4H PRN   ??? heparin (porcine) injection 5,000 Units  5,000 Units SubCUTAneous Q12H     ______________________________________________________________________  EXPECTED LENGTH OF STAY: 3d 9h  ACTUAL LENGTH OF STAY:          2                 Teofilo Pod, MD

## 2015-12-23 NOTE — Other (Signed)
IDR/SLIDR Summary          Patient: [name] [name] MRN: [id number]    Age: 82 y.o.     Birthdate: 31-Dec-1920 Room/Bed: 677/01   Admit Diagnosis: Acute encephalopathy  Altered mental state  Principal Diagnosis: Acute encephalopathy   Goals: Safety, PT/OT, d/c planning  Readmission: NO  Quality Measure: Not applicable  VTE Prophylaxis: Chemical  Influenza Vaccine screening completed? YES  Pneumococcal Vaccine screening completed? YES  Mobility needs: Yes   Nutrition plan:Yes  Consults:P.T, O.T. and Case Management    Financial concerns:No  Escalated to CM? YES  RRAT Score: 16   Interventions:  Testing due for pt today? NO  LOS: 2 days Expected length of stay 2-3 days  Discharge plan: Rehab   PCP: Kassie MendsPatrick M Woodward, MD  Transportation needs: No    Days before discharge:one day until discharge   Discharge disposition: Rehab    Signed:     Lance Musseggy S Lanham, RN  12/23/2015  1:37 AM

## 2015-12-23 NOTE — Progress Notes (Signed)
Bedside and Verbal shift change report given to Lanora ManisElizabeth, Charity fundraiserN (oncoming nurse) by Vernona RiegerLaura, RN (offgoing nurse). Report included the following information SBAR, Kardex, MAR, Recent Results and Cardiac Rhythm Afib.

## 2015-12-23 NOTE — Progress Notes (Signed)
Bedside shift change report given to Luevenia MaxinAshleigh Kaye, RN (oncoming nurse) by Gigi GinPeggy, RN (offgoing nurse). Report included the following information SBAR, Kardex, ED Summary, Procedure Summary, Intake/Output, MAR, Accordion and Cardiac Rhythm A fib.

## 2015-12-23 NOTE — Progress Notes (Signed)
Problem: Falls - Risk of  Goal: *Absence of Falls  Document Schmid Fall Risk and appropriate interventions in the flowsheet.   Outcome: Progressing Towards Goal  Fall Risk Interventions:  Mobility Interventions: Assess mobility with egress test, Bed/chair exit alarm, Communicate number of staff needed for ambulation/transfer, OT consult for ADLs, Patient to call before getting OOB, PT Consult for mobility concerns, PT Consult for assist device competence, Strengthening exercises (ROM-active/passive), Utilize gait belt for transfers/ambulation, Utilize walker, cane, or other assitive device    Mentation Interventions: Adequate sleep, hydration, pain control, Bed/chair exit alarm, Door open when patient unattended, Evaluate medications/consider consulting pharmacy, Eyeglasses and hearing aids, Family/sitter at bedside, Increase mobility, More frequent rounding, Room close to nurse's station, Self-releasing belt, Reorient patient, Toileting rounds, Update white board    Medication Interventions: Assess postural VS orthostatic hypotension, Bed/chair exit alarm, Evaluate medications/consider consulting pharmacy, Patient to call before getting OOB, Teach patient to arise slowly, Utilize gait belt for transfers/ambulation    Elimination Interventions: Bed/chair exit alarm, Call light in reach, Toilet paper/wipes in reach, Toileting schedule/hourly rounds, Urinal in reach    History of Falls Interventions: Bed/chair exit alarm, Consult care management for discharge planning, Door open when patient unattended, Evaluate medications/consider consulting pharmacy, Investigate reason for fall, Room close to nurse's station, Utilize gait belt for transfer/ambulation

## 2015-12-23 NOTE — Progress Notes (Signed)
CM spoke with Catha BrowJean Mayfield liason from [facility]. She stated [facility]. CM informed charge nurse and CCL, because this pt will need to be restraint/sitter free for 24hrs before [facility] can accept him. Bonita QuinLinda Garren,BSW,ACM-SW

## 2015-12-23 NOTE — Progress Notes (Signed)
Sitter discontinued at 1pm.

## 2015-12-23 NOTE — Progress Notes (Signed)
Problem: Mobility Impaired (Adult and Pediatric)  Goal: *Acute Goals and Plan of Care (Insert Text)  Physical Therapy Goals  Initiated 12/21/2015  1.  Patient will move from supine to sit and sit to supine  and scoot up and down in bed with supervision/set-up within 7 day(s).    2.  Patient will transfer from bed to chair and chair to bed with supervision/set-up using the least restrictive device within 7 day(s).  3.  Patient will perform sit to stand with supervision/set-up within 7 day(s).  4.  Patient will ambulate with minimal assistance/contact guard assist for 25 feet with the least restrictive device within 7 day(s).   5.  Patient will improve Berg Balance score by 7 points within 7 days.     physical Therapy TREATMENT  Patient: [name] [name] ([age] y.o. male)  Date: 12/23/2015  Diagnosis: Acute encephalopathy  Altered mental state Acute encephalopathy       Precautions:      ASSESSMENT:  Cleared for mobility progression by RN, received supine in bed with spouse present. Pleasant and cooperative however remains very impulsive at times leading to significantly compromised safety and stability. He was able to complete supine>sit EOB with additional time and CGA (asks for assist but able to complete on his own), demos good static sitting balance at EOB for 10+ min throughout session - kyphotic/fwd head posture. Completed sit<>stands x5 reps to RW with CGA and increased time (min A for eccentric control), occasionally able to improve upright posture with cues. Gait training progressed to approx 375ft x2 reps with RW and min A/CGA - cues for upright posture and AD proximity however very insistent on doing things his way ("let go, get out of my way"). Assisted back to supine at end of session with bed placed in chair position, rails up and bed alarm on as found. Will attempt increased gait distance progression tomorrow with additional person needed for close chair follow. Recommend discharge to  rehab once medically cleared.     Progression toward goals:      Improving appropriately and progressing toward goals      Improving slowly and progressing toward goals      Not making progress toward goals and plan of care will be adjusted     PLAN:  Patient continues to benefit from skilled intervention to address the above impairments.  Continue treatment per established plan of care.  Discharge Recommendations:  Rehab  Further Equipment Recommendations for Discharge:  TBD     SUBJECTIVE:   Patient stated ???Let go, get out of my way, are we going out to that living room????    OBJECTIVE DATA SUMMARY:   Critical Behavior:  Neurologic State: Confused  Orientation Level: Oriented to person, Oriented to place, Disoriented to situation, Disoriented to time  Cognition: Decreased attention/concentration, Decreased command following, Impulsive, Poor safety awareness  Safety/Judgement: Decreased awareness of environment, Decreased awareness of need for assistance, Decreased awareness of need for safety, Decreased insight into deficits  Functional Mobility Training:  Bed Mobility:     Supine to Sit: Contact guard assistance;Additional time  Sit to Supine: Minimum assistance;Assist x1     Transfers:  Sit to Stand: Additional time;Contact guard assistance  Stand to Sit: Minimum assistance     Balance:  Sitting: Intact;With support  Sitting - Static: Good (unsupported)  Sitting - Dynamic: Fair (occasional)  Standing: Impaired  Standing - Static: Good;Constant support  Standing - Dynamic : Poor  Ambulation/Gait Training:  Distance (ft): 5 Feet (  ft) (x2reps)  Assistive Device: Gait belt;Walker, rolling  Ambulation - Level of Assistance: Minimal assistance;Contact guard assistance   Gait Abnormalities: Decreased step clearance;Shuffling gait;Path deviations;Trunk sway increased (fwd flexed)  Base of Support: Widened   Speed/Cadence: Slow;Shuffled  Step Length: Left shortened;Right shortened         Pain:   Pain Scale 1: Numeric (0 - 10)  Pain Intensity 1: 0      Activity Tolerance:   VSS  Please refer to the flowsheet for vital signs taken during this treatment.  After treatment:       Patient left in no apparent distress sitting up in chair      Patient left in no apparent distress in bed in chair position      Call bell left within reach      Nursing notified      Caregiver present      Bed alarm activated    COMMUNICATION/COLLABORATION:   The patient???s plan of care was discussed with: Registered Nurse    Andree CossJaclyn E Michels, PT, DPT   Time Calculation: 30 mins

## 2015-12-23 NOTE — Progress Notes (Signed)
Problem: Falls - Risk of  Goal: *Absence of Falls  Document Schmid Fall Risk and appropriate interventions in the flowsheet.   Outcome: Progressing Towards Goal  Fall Risk Interventions:  Mobility Interventions: Assess mobility with egress test, Bed/chair exit alarm, Communicate number of staff needed for ambulation/transfer, Patient to call before getting OOB, PT Consult for mobility concerns, PT Consult for assist device competence, Strengthening exercises (ROM-active/passive), Utilize walker, cane, or other assitive device    Mentation Interventions: Adequate sleep, hydration, pain control, Bed/chair exit alarm, Door open when patient unattended, Evaluate medications/consider consulting pharmacy, Increase mobility, More frequent rounding, Reorient patient, Room close to nurse's station    Medication Interventions: Assess postural VS orthostatic hypotension, Bed/chair exit alarm, Evaluate medications/consider consulting pharmacy, Patient to call before getting OOB, Teach patient to arise slowly    Elimination Interventions: Bed/chair exit alarm, Call light in reach, Elevated toilet seat, Patient to call for help with toileting needs, Toilet paper/wipes in reach, Toileting schedule/hourly rounds    History of Falls Interventions: Bed/chair exit alarm, Consult care management for discharge planning, Door open when patient unattended, Investigate reason for fall, Room close to nurse's station

## 2015-12-23 NOTE — Progress Notes (Signed)
Neurology Progress Note     NAME: [name] [name]   DOB:  10/26/1920   MRN:  [id number]   DATE:  12/23/2015    Assessment:     Principal Problem:    Acute encephalopathy (12/19/2015)    Active Problems:    Dementia (12/19/2015)      Mild dehydration (12/19/2015)      CAD (coronary artery disease) (12/19/2015)      Chronic a-fib (HCC) (12/19/2015)      Dislocation, hip (HCC) (12/19/2015)      Hard of hearing (12/19/2015)      Cardiomyopathy (HCC) (12/19/2015)      Altered mental state (12/21/2015)      Pt is a 82yo RH male with dementia with sudden onset of confusion and language deficits with atrial fibrillation and congestive heart failure, off Coumadin x1 month due to recent falls who, on exam, was pleasant, appropriate, has clear memory impairment, but is able to name and follow all commands. Remainder of his exam was nonfocal. Suspected embolic stroke, but MRI brain without acute CVA seen.  Does have extensive CIWM changes and atrophy.  No obvious other cause for a toxic or metabolic encephalopathy.  Possible progression of underlying dementia.   Plan:   Stable for d/c to SNF from neuro standpoint  If patient remains non-ambulatory, then would consider restarting anticoagulation, otherwise, continue aspirin.    Chart reviewed since last seen  Subjective:   Patient is seen with his wife at the bedside.  Reviewed MRI results with her.  Patient apparently had some sundowning last night and is more agitated today.      Objective:     Current Facility-Administered Medications   Medication Dose Route Frequency   ??? balsam peru-castor oil (VENELEX) [id number] mg/gram ointment   Topical Q8H   ??? levoFLOXacin (LEVAQUIN) 250 mg in D5W IVPB  250 mg IntraVENous Q24H   ??? aspirin delayed-release tablet 81 mg  81 mg Oral DAILY    ??? sodium chloride (NS) flush 5-10 mL  5-10 mL IntraVENous Q8H   ??? sodium chloride (NS) flush 5-10 mL  5-10 mL IntraVENous PRN   ??? ondansetron (ZOFRAN) injection 4 mg  4 mg IntraVENous Q4H PRN   ??? heparin (porcine) injection 5,000 Units  5,000 Units SubCUTAneous Q12H       Visit Vitals   ??? BP 132/72 (BP 1 Location: Left arm, BP Patient Position: At rest)   ??? Pulse 100   ??? Temp 97.9 ??F (36.6 ??C)   ??? Resp 17   ??? Ht 6\' 1"  (1.854 m)   ??? Wt 83.7 kg (184 lb 8 oz)   ??? SpO2 95%   ??? BMI 24.34 kg/m2     Temp (24hrs), Avg:98.3 ??F (36.8 ??C), Min:97.8 ??F (36.6 ??C), Max:99.3 ??F (37.4 ??C)         12/12 1901 - 12/14 0700  In: 120 [P.O.:120]  Out: 350 [Urine:350]      Physical Exam:  General: Well developed well nourished patient in no apparent distress.      Neurological Exam:  Mental Status: Oriented to person, does not answer other orientation questions, agitated.   Cranial Nerves:   No facial asym or ptosis   Motor:  5/5 grip, no tremors   Reflexes:      Sensory:      Gait:     Cerebellar:           Lab Review   Recent Results (from the past 24 hour(s))  GLUCOSE, POC    Collection Time: 12/23/15 12:10 PM   Result Value Ref Range    Glucose (POC) 132 (H) 65 - 100 mg/dL    Performed by Marco CollieGregory Rhonda        Additional comments:  I have reviewed the patient's new clinical lab test results.  I have personally reviewed the patient's radiographs.  MRI   MRI Results (most recent):    Results from Hospital Encounter encounter on 12/19/15   MRI BRAIN WO CONT   Narrative EXAM:  MRI BRAIN WO CONT    INDICATION:  Hospitalization for metabolic encephalopathy versus CVA. Confusion,  garbled speech, and generalized body weakness. Chronic dementia.    COMPARISON: CT head on 12/19/2015    TECHNIQUE: Sagittal T1; coronal T2; axial T1, T2, FLAIR, gradient-echo, and  diffusion weighted noncontrast MRI of the brain.    FINDINGS: Motion artifact obscures fine detail and limits sensitivity for  detecting pathology.     No hydrocephalus. No mass effect or midline shift. No extra-axial fluid  collection. Moderate chronic microvascular ischemic disease for age is  predominantly in the bilateral frontal and parietal periventricular white  matter. No restricted diffusion to indicate acute infarct. There is artifact in  the medial left occipital lobe on the DWI.    The major intracranial vascular flow-voids are patent. Medial temporal lobes are  symmetric. The midline structures, including the cervicomedullary junction, are  within normal limits.         Impression IMPRESSION:  Moderate chronic microvascular ischemic disease. No acute infarct. Limited by  motion artifact.             Care Plan discussed with:  Patient x   Family x   RN x   Care Manager    Consultant/Specialist:       Signed:Jodi MourningMary M Keltie Labell, MD

## 2015-12-23 NOTE — Progress Notes (Signed)
Spiritual Care Partner Volunteer visited patient in [phone] on 12.14.17.    Documented by:  Damita Lackhaplain Jeff Walton, M.Div, M.S, [facility]  Spiritual Care available at [phone]

## 2015-12-24 LAB — CBC WITH AUTOMATED DIFF
ABS. BASOPHILS: 0 10*3/uL (ref 0.0–0.1)
ABS. EOSINOPHILS: 0.1 10*3/uL (ref 0.0–0.4)
ABS. LYMPHOCYTES: 1.6 10*3/uL (ref 0.8–3.5)
ABS. MONOCYTES: 1.4 10*3/uL — ABNORMAL HIGH (ref 0.0–1.0)
ABS. NEUTROPHILS: 7.8 10*3/uL (ref 1.8–8.0)
BASOPHILS: 0 % (ref 0–1)
EOSINOPHILS: 1 % (ref 0–7)
HCT: 39.8 % (ref 36.6–50.3)
HGB: 13.6 g/dL (ref 12.1–17.0)
LYMPHOCYTES: 15 % (ref 12–49)
MCH: 31.9 PG (ref 26.0–34.0)
MCHC: 34.2 g/dL (ref 30.0–36.5)
MCV: 93.2 FL (ref 80.0–99.0)
MONOCYTES: 13 % (ref 5–13)
NEUTROPHILS: 71 % (ref 32–75)
PLATELET: 330 10*3/uL (ref 150–400)
RBC: 4.27 M/uL (ref 4.10–5.70)
RDW: 13 % (ref 11.5–14.5)
WBC: 10.9 10*3/uL (ref 4.1–11.1)

## 2015-12-24 LAB — METABOLIC PANEL, COMPREHENSIVE
A-G Ratio: 0.8 — ABNORMAL LOW (ref 1.1–2.2)
ALT (SGPT): 40 U/L (ref 12–78)
AST (SGOT): 69 U/L — ABNORMAL HIGH (ref 15–37)
Albumin: 3 g/dL — ABNORMAL LOW (ref 3.5–5.0)
Alk. phosphatase: 60 U/L (ref 45–117)
Anion gap: 8 mmol/L (ref 5–15)
BUN/Creatinine ratio: 32 — ABNORMAL HIGH (ref 12–20)
BUN: 29 MG/DL — ABNORMAL HIGH (ref 6–20)
Bilirubin, total: 1.2 MG/DL — ABNORMAL HIGH (ref 0.2–1.0)
CO2: 27 mmol/L (ref 21–32)
Calcium: 8.5 MG/DL (ref 8.5–10.1)
Chloride: 100 mmol/L (ref 97–108)
Creatinine: 0.91 MG/DL (ref 0.70–1.30)
GFR est AA: >60 mL/min/{1.73_m2} (ref >60)
GFR est non-AA: >60 mL/min/{1.73_m2} (ref >60)
Globulin: 3.8 g/dL (ref 2.0–4.0)
Glucose: 101 mg/dL — ABNORMAL HIGH (ref 65–100)
Potassium: 4.2 mmol/L (ref 3.5–5.1)
Protein, total: 6.8 g/dL (ref 6.4–8.2)
Sodium: 135 mmol/L — ABNORMAL LOW (ref 136–145)

## 2015-12-24 MED FILL — ASPIRIN 81 MG TAB, DELAYED RELEASE: 81 mg | ORAL | Qty: 1

## 2015-12-24 MED FILL — HEPARIN (PORCINE) 5,000 UNIT/ML IJ SOLN: 5000 unit/mL | INTRAMUSCULAR | Qty: 1

## 2015-12-24 MED FILL — NORMAL SALINE FLUSH 0.9 % INJECTION SYRINGE: INTRAMUSCULAR | Qty: 10

## 2015-12-24 NOTE — Progress Notes (Signed)
CM received a call from Janith LimaJean ,liason for [facility] stating that [facility] if he is medically ready for d/c. CM informed attending and pt's wife, who are in agreement with d/c tomorrow if pt is medically ready. Bonita QuinLinda Garren,BSW,ACM-SW

## 2015-12-24 NOTE — Progress Notes (Signed)
Bedside shift change report given to Elizabeth RN (oncoming nurse) by Katie RN (offgoing nurse). Report included the following information SBAR, Kardex, Intake/Output, MAR and Recent Results.

## 2015-12-24 NOTE — Progress Notes (Signed)
Problem: Falls - Risk of  Goal: *Absence of Falls  Document Schmid Fall Risk and appropriate interventions in the flowsheet.   Outcome: Progressing Towards Goal  Fall Risk Interventions:  Mobility Interventions: Bed/chair exit alarm, Communicate number of staff needed for ambulation/transfer, OT consult for ADLs, Patient to call before getting OOB, PT Consult for mobility concerns, PT Consult for assist device competence    Mentation Interventions: Bed/chair exit alarm, Door open when patient unattended, Eyeglasses and hearing aids, Family/sitter at bedside, Room close to nurse's station    Medication Interventions: Bed/chair exit alarm, Patient to call before getting OOB, Teach patient to arise slowly    Elimination Interventions: Bed/chair exit alarm, Call light in reach, Patient to call for help with toileting needs, Toileting schedule/hourly rounds    History of Falls Interventions: Bed/chair exit alarm, Consult care management for discharge planning, Door open when patient unattended, Room close to nurse's station        Problem: Pressure Injury - Risk of  Goal: *Prevention of pressure ulcer  Outcome: Progressing Towards Goal  q2h turning    Problem: Pain  Goal: *Control of Pain  Outcome: Progressing Towards Goal  No c/o pain

## 2015-12-24 NOTE — Progress Notes (Signed)
Hospitalist Progress Note  Teofilo PodPunit Nishika Parkhurst, MD  Answering service: [phone] from in house phone  Cell: [phone]      Date of Service:  12/24/2015  NAME:  Despina HickVernon Chinchilla  DOB:  1920-08-06  MRN:  [id number]      Admission Summary:     Patient is a 82 year old male with past medical history of CAD s/p CABG, cardiomyopathy with EF of 20%, chronic atrial fibrillation, dementia, chronic hip dislocation, very hard on hearing who presented to the ER   Via EMS with the complaint of confusion, garbled speech and generalized body weakness.    Interval history / Subjective:      F/u for altered mental status.  Patient is very hard on hearing.  No new issues  Overnight did reasonably well as the wife was there with the patient in the room     Assessment & Plan:     Altered mental status likely secondary to metabolic encephalopathy vs acute stroke.  This is more likely due to metabolic encephalopathy in the setting of dementia  Initial CT head was negative. Carotid dopplers unremarkable  Echo: EF of 20- 25% with severe diffuse hypokinesis with distinct regional wall motion abnormalities.  -MRI not yet done due to combativeness of patient but not sure if it is going to help in the treatment as now the patient looks much better  - Put on his hearing aids to in order to reduce sensory impairments.  - Continue supportive care for now  - Avoid benzodiazepine's and benadryl  -MRI brain 12/13 Moderate chronic microvascular ischemic disease. No acute infarct. Limited by  motion artifact. Wife informed about the results    Chronic Ischemic cardiomyopathy: NYHA class unable to be determined  Echo with EF 20-25%  Hold Lasix.  Fluid restriction < 1800 cc/ day.  Continue aspirin, mag-oxide and zocor  Daily weight  -Appreciate cardiology    Dementia with acute behavioral disturbance  Supportive care/one to one observation   Told the wife that in order for the patient to go to rehab, the patient has to remain sitter free. Wife agrees on staying in the hospital with the patient tonight    Hyponatremia: Fluid restriction to 1800 cc/day  monitor    Possible left lower lobe PNA:   -As evidenced by small left lower lobe infiltrate on CXR  On LVQ but don't think that the patient has pneumonia  No fever or leucocytosis  -LVQ stopped today    CAD s/p CABG x 6.  Resume home aspirin/statin    Chronic atrial fibrillation; Now rate controlled  Continue aspirin.    Elevated troponin: obtain 2 more set  EKG afib with non specific T wave abnormalities    Hypomagnesemia: replete with magnesium sulfate  Keep mag > 2 and K > 4    Reported apneic episode  -while the patient was sleeping.  -Would not do any workup regarding that    Cardiac diet    PT/OT inpatient rehab, CM informed    Code status: DNR  DVT prophylaxis: Lovenox    Plan: Now the patient is sitter free but [facility] does not have any rooms. Likely discharge tomorrow    Care Plan discussed with: Patient/Family and Nurse  Disposition: as above      Hospital Problems  Date Reviewed: 12/19/2015          Codes Class Noted POA    Altered mental state ICD-10-CM: R41.82  ICD-9-CM: 780.97  12/21/2015  Unknown        * (Principal)Acute encephalopathy ICD-10-CM: G93.40  ICD-9-CM: 348.30  12/19/2015 Unknown        Dementia (Chronic) ICD-10-CM: F03.90  ICD-9-CM: 294.20  12/19/2015 Yes        Mild dehydration ICD-10-CM: E86.0  ICD-9-CM: 276.51  12/19/2015 Yes        CAD (coronary artery disease) (Chronic) ICD-10-CM: I25.10  ICD-9-CM: 414.00  12/19/2015 Yes        Chronic a-fib (HCC) (Chronic) ICD-10-CM: I48.2  ICD-9-CM: 427.31  12/19/2015 Yes        Dislocation, hip (HCC) (Chronic) ICD-10-CM: R60.454US73.006A  ICD-9-CM: 835.00  12/19/2015 Unknown        Hard of hearing (Chronic) ICD-10-CM: H91.90  ICD-9-CM: 389.9  12/19/2015 Yes        Cardiomyopathy (HCC) (Chronic) ICD-10-CM: I42.9   ICD-9-CM: 425.4  12/19/2015 Unknown                Review of Systems:   Pertinent items are noted in HPI.       Vital Signs:    Last 24hrs VS reviewed since prior progress note. Most recent are:  Visit Vitals   ??? BP 123/73 (BP 1 Location: Right arm, BP Patient Position: At rest)   ??? Pulse 96   ??? Temp 97.7 ??F (36.5 ??C)   ??? Resp 14   ??? Ht 6\' 1"  (1.854 m)   ??? Wt 86.3 kg (190 lb 4.8 oz)   ??? SpO2 93%   ??? BMI 25.11 kg/m2         Intake/Output Summary (Last 24 hours) at 12/24/15 1000  Last data filed at 12/24/15 0402   Gross per 24 hour   Intake                0 ml   Output              400 ml   Net             -400 ml        Physical Examination:             Constitutional:  No acute distress, cooperative, pleasant. Very hard of hearing. Elderly   ENT:  Oral mucous moist, oropharynx benign. Neck supple,    Resp:  CTA bilaterally. No wheezing/rhonchi/rales. No accessory muscle use   CV:  Irregularly irregular no murmurs, gallops, rubs    GI:  Soft, non distended, non tender. normoactive bowel sounds, no hepatosplenomegaly     Musculoskeletal:  No edema, warm, 2+ pulses throughout    Neurologic:  Moves all extremities.  Awake. Orientation times zero            Data Review:          Labs:     Recent Labs      12/24/15   0653   WBC  10.9   HGB  13.6   HCT  39.8   PLT  330     Recent Labs      12/24/15   0653   NA  135*   K  4.2   CL  100   CO2  27   BUN  29*   CREA  0.91   GLU  101*   CA  8.5     Recent Labs      12/24/15   0653   SGOT  69*   ALT  40   AP  60   TBILI  1.2*   TP  6.8   ALB  3.0*   GLOB  3.8     No results for input(s): INR, PTP, APTT in the last 72 hours.    No lab exists for component: INREXT, INREXT   No results for input(s): FE, TIBC, PSAT, FERR in the last 72 hours.   Lab Results   Component Value Date/Time    Folate 25.9 12/19/2015 03:22 PM      No results for input(s): PH, PCO2, PO2 in the last 72 hours.  No results for input(s): CPK, CKNDX, TROIQ in the last 72 hours.     No lab exists for component: CPKMB  No results found for: CHOL, CHOLX, CHLST, CHOLV, HDL, LDL, LDLC, DLDLP, TGLX, TRIGL, TRIGP, CHHD, CHHDX  Lab Results   Component Value Date/Time    Glucose (POC) 132 12/23/2015 12:10 PM    Glucose (POC) 96 12/19/2015 03:17 PM     Lab Results   Component Value Date/Time    Color YELLOW/STRAW 12/19/2015 03:59 PM    Appearance CLEAR 12/19/2015 03:59 PM    Specific gravity 1.028 12/19/2015 03:59 PM    pH (UA) 7.0 12/19/2015 03:59 PM    Protein 100 12/19/2015 03:59 PM    Glucose NEGATIVE  12/19/2015 03:59 PM    Ketone NEGATIVE  12/19/2015 03:59 PM    Bilirubin NEGATIVE  12/19/2015 03:59 PM    Urobilinogen 0.2 12/19/2015 03:59 PM    Nitrites NEGATIVE  12/19/2015 03:59 PM    Leukocyte Esterase NEGATIVE  12/19/2015 03:59 PM    Epithelial cells FEW 12/19/2015 03:59 PM    Bacteria NEGATIVE  12/19/2015 03:59 PM    WBC 0-4 12/19/2015 03:59 PM    RBC 0-5 12/19/2015 03:59 PM         Medications Reviewed:     Current Facility-Administered Medications   Medication Dose Route Frequency   ??? balsam peru-castor oil (VENELEX) [id number] mg/gram ointment   Topical Q8H   ??? aspirin delayed-release tablet 81 mg  81 mg Oral DAILY   ??? sodium chloride (NS) flush 5-10 mL  5-10 mL IntraVENous Q8H   ??? sodium chloride (NS) flush 5-10 mL  5-10 mL IntraVENous PRN   ??? ondansetron (ZOFRAN) injection 4 mg  4 mg IntraVENous Q4H PRN   ??? heparin (porcine) injection 5,000 Units  5,000 Units SubCUTAneous Q12H     ______________________________________________________________________  EXPECTED LENGTH OF STAY: 3d 9h  ACTUAL LENGTH OF STAY:          3                 Teofilo Pod, MD

## 2015-12-24 NOTE — Other (Signed)
IDR/SLIDR Summary          Patient: Despina HickVernon Lepak MRN: [id number]    Age: 82 y.o.     Birthdate: Dec 13, 1920 Room/Bed: 677/01   Admit Diagnosis: Acute encephalopathy  Altered mental state  Principal Diagnosis: Acute encephalopathy   Goals: PT/OT  Readmission: NO  Quality Measure: Not applicable  VTE Prophylaxis: Chemical  Influenza Vaccine screening completed? YES  Pneumococcal Vaccine screening completed? YES  Mobility needs: Yes   Nutrition plan:No  Consults:P.T, O.T. and Case Management    Financial concerns:No  Escalated to CM? NO  RRAT Score: 16   Interventions:H2H  Testing due for pt today? NO  LOS: 3 days Expected length of stay 4 days  Discharge plan: [facility]   PCP: Kassie MendsPatrick M Woodward, MD  Transportation needs: Yes    Days before discharge:ready for discharge  Discharge disposition: Rehab    Signed:     Roland Earllizabeth M Watson, RN  12/24/2015  6:32 AM

## 2015-12-25 MED FILL — ASPIRIN 81 MG TAB, DELAYED RELEASE: 81 mg | ORAL | Qty: 1

## 2015-12-25 MED FILL — HEPARIN (PORCINE) 5,000 UNIT/ML IJ SOLN: 5000 unit/mL | INTRAMUSCULAR | Qty: 1

## 2015-12-25 MED FILL — NORMAL SALINE FLUSH 0.9 % INJECTION SYRINGE: INTRAMUSCULAR | Qty: 10

## 2015-12-25 NOTE — Progress Notes (Signed)
Located Weekend On Biomedical scientistCall Representative for [facility]:  Megan ( [phone]) [phone].  Have reached out to coordinate discharge for Mr. Grammatico. Awaiting call back. Patient will need ambulance transport to the [facility].      Carlyon ProwsJoeylynn D Patton, RN

## 2015-12-25 NOTE — Other (Signed)
IDR/SLIDR Summary          Patient: [name] [name] MRN: [id number]    Age: 82 y.o.     Birthdate: 05-10-1920 Room/Bed: 677/01   Admit Diagnosis: Acute encephalopathy  Altered mental state  Principal Diagnosis: Acute encephalopathy   Goals: discharge from rehab  Readmission: NO  Quality Measure: Not applicable  VTE Prophylaxis: Chemical  Influenza Vaccine screening completed? YES  Pneumococcal Vaccine screening completed? YES  Mobility needs: Yes   Nutrition plan:Yes  Consults:P.T, O.T. and Case Management    Financial concerns:No  Escalated to CM? NO  RRAT Score: 19   Interventions:H2H  Testing due for pt today? NO  LOS: 4 days Expected length of stay 4 days  Discharge plan: rehab   PCP: Kassie MendsPatrick M Woodward, MD  Transportation needs: Yes    Days before discharge:ready for discharge  Discharge disposition: Rehab    Signed:     Othelia PullingKatie L McDonald, RN  12/25/2015  11:58 AM

## 2015-12-25 NOTE — Progress Notes (Addendum)
Patient has been accepted to [facility] [place] location.    REPORT MAY BE CALLED TO:  [phone]    EMTALA TO BE INITIATED WITH ACCEPTING PHYSICIAN:  Dr. Chucky MayHillary Hawkins.    Transportation has been requested with [facility] for stretcher at 2:00pm.    Carlyon ProwsJoeylynn D Patton, RN     **UPDATE** 12/25/15 12:15PM [facility] TO TRANSPORT AT 3PM PER MESSAGE RECEIVED IN [facility].

## 2015-12-25 NOTE — Progress Notes (Signed)
Problem: Falls - Risk of  Goal: *Absence of Falls  Document Schmid Fall Risk and appropriate interventions in the flowsheet.   Outcome: Progressing Towards Goal  Fall Risk Interventions:  Mobility Interventions: Patient to call before getting OOB, Utilize walker, cane, or other assitive device    Mentation Interventions: Bed/chair exit alarm, Increase mobility, More frequent rounding, Family/sitter at bedside, Familiar objects from home, Eyeglasses and hearing aids, Reorient patient, Room close to nurse's station    Medication Interventions: Patient to call before getting OOB, Evaluate medications/consider consulting pharmacy, Bed/chair exit alarm, Utilize gait belt for transfers/ambulation    Elimination Interventions: Call light in reach, Urinal in reach, Elevated toilet seat, Patient to call for help with toileting needs, Toilet paper/wipes in reach, Toileting schedule/hourly rounds    History of Falls Interventions: Bed/chair exit alarm, Consult care management for discharge planning, Door open when patient unattended, Room close to nurse's station

## 2015-12-25 NOTE — Discharge Summary (Signed)
Discharge Summary       PATIENT ID: [name] [name]  MRN: [id number]   DATE OF BIRTH: [birth year]    DATE OF ADMISSION: 12/19/2015  2:57 PM    DATE OF DISCHARGE: 12/25/2015   PRIMARY CARE PROVIDER: Kassie MendsPatrick M Woodward, MD     ATTENDING PHYSICIAN: Dr [name] [name]  DISCHARGING PROVIDER: Teofilo PodPunit Tyra Gural, MD    To contact this individual call [phone] and ask the operator to page.  If unavailable ask to be transferred the Adult Hospitalist Department.    CONSULTATIONS: IP CONSULT TO CARDIOLOGY  IP CONSULT TO NEUROLOGY    PROCEDURES/SURGERIES: * No surgery found *    ADMITTING DIAGNOSES & HOSPITAL COURSE:   Altered mental status likely secondary to metabolic encephalopathy vs acute stroke.  This is more likely due to metabolic encephalopathy in the setting of dementia  Initial CT head was negative. Carotid dopplers unremarkable  Echo: EF of 20- 25% with severe diffuse hypokinesis with distinct regional wall motion abnormalities.  -MRI not yet done due to combativeness of patient but not sure if it is going to help in the treatment as now the patient looks much better  - Put on his hearing aids to in order to reduce sensory impairments.  - Continue supportive care for now  - Avoid benzodiazepine's and benadryl  -MRI brain 12/13 Moderate chronic microvascular ischemic disease. No acute infarct. Limited by  motion artifact. Wife informed about the results    Chronic Ischemic cardiomyopathy: NYHA class unable to be determined  Echo with EF 20-25%  Hold Lasix.  Fluid restriction < 1800 cc/ day.  Continue aspirin, mag-oxide and zocor  Daily weight  -Appreciate cardiology    Dementia with acute behavioral disturbance  Supportive care/one to one observation  Told the wife that in order for the patient to go to rehab, the patient has to remain sitter free. Wife agrees on staying in the hospital with the patient tonight    Hyponatremia: Fluid restriction to 1800 cc/day  monitor    Possible left lower lobe PNA:    -As evidenced by small left lower lobe infiltrate on CXR  On LVQ but don't think that the patient has pneumonia  No fever or leucocytosis  -LVQ stopped today    CAD s/p CABG x 6.  Resume home aspirin/statin    Chronic atrial fibrillation; Now rate controlled  Continue aspirin.    Elevated troponin: obtain 2 more set  EKG afib with non specific T wave abnormalities    Hypomagnesemia: replete with magnesium sulfate  Keep mag > 2 and K > 4    Reported apneic episode  -while the patient was sleeping.  -Would not do any workup regarding that. Spoke to the son in law in that regard and he agrees    Cardiac diet    PT/OT inpatient rehab, CM informed    Code status: DNR  DVT prophylaxis: Lovenox        DISCHARGE DIAGNOSES / PLAN:      1.  Altered mental state sec to metabolic encephalopathy       PENDING TEST RESULTS:   At the time of discharge the following test results are still pending: none    FOLLOW UP APPOINTMENTS:    Follow-up Information     Follow up With Details Comments Contact Info    [name] KocherBETH SHOLOM [name])   [address]  [place] [id number]  [phone]      [name] LikesAalya Crowl, MD In 3 weeks  [address]  [address]  [place] [id number]  [phone]      [name] MendsPatrick M Woodward, MD In 1 week  [address]  [address]  [place] [id number]  [phone]             ADDITIONAL CARE RECOMMENDATIONS:   Follow up with pMD in 1 week  Follow up with cardiology  Outpatient readdress by PMD on need for Lasix/Kcl    DIET: Cardiac Diet    ACTIVITY: Activity as tolerated      DISCHARGE MEDICATIONS:   See Medication Reconciliation Form      NOTIFY YOUR PHYSICIAN FOR ANY OF THE FOLLOWING:   Fever over 101 degrees for 24 hours.   Chest pain, shortness of breath, fever, chills, nausea, vomiting, diarrhea, change in mentation, falling, weakness, bleeding. Severe pain or pain not relieved by medications.  Or, any other signs or symptoms that you may have questions about.    DISPOSITION:    Home With:   OT  PT  HH  RN        SNF/Inpatient Rehab/LTAC   x Independent/assisted living    Hospice    Other:       PATIENT CONDITION AT DISCHARGE:     Functional status    Poor    x Deconditioned     Independent      Cognition     Lucid     Forgetful    x Dementia      Catheters/lines (plus indication)    Foley     PICC     PEG    x None      Code status     Full code    x DNR      PHYSICAL EXAMINATION AT DISCHARGE:  Please see progress note      CHRONIC MEDICAL DIAGNOSES:  Problem List as of 12/25/2015  Date Reviewed: 12/19/2015          Codes Class Noted - Resolved    Altered mental state ICD-10-CM: R41.82  ICD-9-CM: 780.97  12/21/2015 - Present        Chronic systolic heart failure (HCC) (Chronic) ICD-10-CM: I50.22  ICD-9-CM: 428.22  12/20/2015 - Present        * (Principal)Acute encephalopathy ICD-10-CM: G93.40  ICD-9-CM: 348.30  12/19/2015 - Present        Dementia (Chronic) ICD-10-CM: F03.90  ICD-9-CM: 294.20  12/19/2015 - Present        Mild dehydration ICD-10-CM: E86.0  ICD-9-CM: 276.51  12/19/2015 - Present        CAD (coronary artery disease) (Chronic) ICD-10-CM: I25.10  ICD-9-CM: 414.00  12/19/2015 - Present        Chronic a-fib (HCC) (Chronic) ICD-10-CM: I48.2  ICD-9-CM: 427.31  12/19/2015 - Present        Dislocation, hip (HCC) (Chronic) ICD-10-CM: V78.469GS73.006A  ICD-9-CM: 835.00  12/19/2015 - Present        Hard of hearing (Chronic) ICD-10-CM: H91.90  ICD-9-CM: 389.9  12/19/2015 - Present        Cardiomyopathy (HCC) (Chronic) ICD-10-CM: I42.9  ICD-9-CM: 425.4  12/19/2015 - Present              Greater than 39 minutes were spent with the patient on counseling and coordination of care    Signed:   Teofilo PodPunit Nieve Rojero, MD  12/25/2015  12:32 PM

## 2015-12-25 NOTE — Progress Notes (Signed)
I have reviewed discharge instructions with the patient and spouse.  The patient and spouse verbalized understanding.

## 2015-12-25 NOTE — Progress Notes (Signed)
Problem: Pressure Injury - Risk of  Goal: *Prevention of pressure ulcer  Outcome: Progressing Towards Goal  Turns self    Problem: Pain  Goal: *Control of Pain  Outcome: Progressing Towards Goal  No c/o pain

## 2015-12-25 NOTE — Progress Notes (Signed)
Hospitalist Progress Note  Teofilo PodPunit Paolo Okane, MD  Answering service: [phone] OR [phone] from in house phone  Cell: [phone]      Date of Service:  12/25/2015  NAME:  [name] [name]  DOB:  02/05/1920  MRN:  [id number]      Admission Summary:     Patient is a 82 year old male with past medical history of CAD s/p CABG, cardiomyopathy with EF of 20%, chronic atrial fibrillation, dementia, chronic hip dislocation, very hard on hearing who presented to the ER   Via EMS with the complaint of confusion, garbled speech and generalized body weakness.    Interval history / Subjective:      F/u for altered mental status.  Patient is very hard on hearing.  No new issues  Overnight again reasonably well as the wife was there with the patient in the room     Assessment & Plan:     Altered mental status likely secondary to metabolic encephalopathy vs acute stroke.  This is more likely due to metabolic encephalopathy in the setting of dementia  Initial CT head was negative. Carotid dopplers unremarkable  Echo: EF of 20- 25% with severe diffuse hypokinesis with distinct regional wall motion abnormalities.  -MRI not yet done due to combativeness of patient but not sure if it is going to help in the treatment as now the patient looks much better  - Put on his hearing aids to in order to reduce sensory impairments.  - Continue supportive care for now  - Avoid benzodiazepine's and benadryl  -MRI brain 12/13 Moderate chronic microvascular ischemic disease. No acute infarct. Limited by  motion artifact. Wife informed about the results    Chronic Ischemic cardiomyopathy: NYHA class unable to be determined  Echo with EF 20-25%  Hold Lasix.  Fluid restriction < 1800 cc/ day.  Continue aspirin, mag-oxide and zocor  Daily weight  -Appreciate cardiology    Dementia with acute behavioral disturbance  Supportive care/one to one observation   Told the wife that in order for the patient to go to rehab, the patient has to remain sitter free. Wife agrees on staying in the hospital with the patient tonight    Hyponatremia: Fluid restriction to 1800 cc/day  monitor    Possible left lower lobe PNA:   -As evidenced by small left lower lobe infiltrate on CXR  On LVQ but don't think that the patient has pneumonia  No fever or leucocytosis  -LVQ stopped today    CAD s/p CABG x 6.  Resume home aspirin/statin    Chronic atrial fibrillation; Now rate controlled  Continue aspirin.    Elevated troponin: obtain 2 more set  EKG afib with non specific T wave abnormalities    Hypomagnesemia: replete with magnesium sulfate  Keep mag > 2 and K > 4    Reported apneic episode  -while the patient was sleeping.  -Would not do any workup regarding that. Spoke to the son in law in that regard and he agrees    Cardiac diet    PT/OT inpatient rehab, CM informed    Code status: DNR  DVT prophylaxis: Lovenox    Plan: Discharge today to [facility]    Care Plan discussed with: Patient/Family and Nurse  Disposition: as above      Hospital Problems  Date Reviewed: 12/19/2015          Codes Class Noted POA    Altered mental state ICD-10-CM: R41.82  ICD-9-CM: 780.97  12/21/2015  Unknown        * (Principal)Acute encephalopathy ICD-10-CM: G93.40  ICD-9-CM: 348.30  12/19/2015 Unknown        Dementia (Chronic) ICD-10-CM: F03.90  ICD-9-CM: 294.20  12/19/2015 Yes        Mild dehydration ICD-10-CM: E86.0  ICD-9-CM: 276.51  12/19/2015 Yes        CAD (coronary artery disease) (Chronic) ICD-10-CM: I25.10  ICD-9-CM: 414.00  12/19/2015 Yes        Chronic a-fib (HCC) (Chronic) ICD-10-CM: I48.2  ICD-9-CM: 427.31  12/19/2015 Yes        Dislocation, hip (HCC) (Chronic) ICD-10-CM: Z61.096E  ICD-9-CM: 835.00  12/19/2015 Unknown        Hard of hearing (Chronic) ICD-10-CM: H91.90  ICD-9-CM: 389.9  12/19/2015 Yes        Cardiomyopathy (HCC) (Chronic) ICD-10-CM: I42.9  ICD-9-CM: 425.4  12/19/2015 Unknown                 Review of Systems:   Pertinent items are noted in HPI.       Vital Signs:    Last 24hrs VS reviewed since prior progress note. Most recent are:  Visit Vitals   ??? BP 103/59 (BP 1 Location: Left arm, BP Patient Position: At rest)   ??? Pulse 85   ??? Temp 97.4 ??F (36.3 ??C)   ??? Resp 17   ??? Ht 6\' 1"  (1.854 m)   ??? Wt 82.5 kg (181 lb 14.1 oz)   ??? SpO2 95%   ??? BMI 24 kg/m2       No intake or output data in the 24 hours ending 12/25/15 1227     Physical Examination:             Constitutional:  No acute distress, cooperative, pleasant. Very hard of hearing. Elderly   ENT:  Oral mucous moist, oropharynx benign. Neck supple,    Resp:  CTA bilaterally. No wheezing/rhonchi/rales. No accessory muscle use   CV:  Irregularly irregular no murmurs, gallops, rubs    GI:  Soft, non distended, non tender. normoactive bowel sounds, no hepatosplenomegaly     Musculoskeletal:  No edema, warm, 2+ pulses throughout    Neurologic:  Moves all extremities.  Awake. Orientation times zero            Data Review:          Labs:     Recent Labs      12/24/15   0653   WBC  10.9   HGB  13.6   HCT  39.8   PLT  330     Recent Labs      12/24/15   0653   NA  135*   K  4.2   CL  100   CO2  27   BUN  29*   CREA  0.91   GLU  101*   CA  8.5     Recent Labs      12/24/15   0653   SGOT  69*   ALT  40   AP  60   TBILI  1.2*   TP  6.8   ALB  3.0*   GLOB  3.8     No results for input(s): INR, PTP, APTT in the last 72 hours.    No lab exists for component: INREXT, INREXT   No results for input(s): FE, TIBC, PSAT, FERR in the last 72 hours.   Lab Results   Component Value Date/Time  Folate 25.9 12/19/2015 03:22 PM      No results for input(s): PH, PCO2, PO2 in the last 72 hours.  No results for input(s): CPK, CKNDX, TROIQ in the last 72 hours.    No lab exists for component: CPKMB  No results found for: CHOL, CHOLX, CHLST, CHOLV, HDL, LDL, LDLC, DLDLP, TGLX, TRIGL, TRIGP, CHHD, CHHDX  Lab Results   Component Value Date/Time     Glucose (POC) 132 12/23/2015 12:10 PM    Glucose (POC) 96 12/19/2015 03:17 PM     Lab Results   Component Value Date/Time    Color YELLOW/STRAW 12/19/2015 03:59 PM    Appearance CLEAR 12/19/2015 03:59 PM    Specific gravity 1.028 12/19/2015 03:59 PM    pH (UA) 7.0 12/19/2015 03:59 PM    Protein 100 12/19/2015 03:59 PM    Glucose NEGATIVE  12/19/2015 03:59 PM    Ketone NEGATIVE  12/19/2015 03:59 PM    Bilirubin NEGATIVE  12/19/2015 03:59 PM    Urobilinogen 0.2 12/19/2015 03:59 PM    Nitrites NEGATIVE  12/19/2015 03:59 PM    Leukocyte Esterase NEGATIVE  12/19/2015 03:59 PM    Epithelial cells FEW 12/19/2015 03:59 PM    Bacteria NEGATIVE  12/19/2015 03:59 PM    WBC 0-4 12/19/2015 03:59 PM    RBC 0-5 12/19/2015 03:59 PM         Medications Reviewed:     Current Facility-Administered Medications   Medication Dose Route Frequency   ??? balsam peru-castor oil (VENELEX) [id number] mg/gram ointment   Topical Q8H   ??? aspirin delayed-release tablet 81 mg  81 mg Oral DAILY   ??? sodium chloride (NS) flush 5-10 mL  5-10 mL IntraVENous Q8H   ??? sodium chloride (NS) flush 5-10 mL  5-10 mL IntraVENous PRN   ??? ondansetron (ZOFRAN) injection 4 mg  4 mg IntraVENous Q4H PRN   ??? heparin (porcine) injection 5,000 Units  5,000 Units SubCUTAneous Q12H     ______________________________________________________________________  EXPECTED LENGTH OF STAY: 3d 9h  ACTUAL LENGTH OF STAY:          4                 Teofilo PodPunit Bluma Buresh, MD

## 2015-12-25 NOTE — Discharge Summary (Signed)
Discharge  Summary by Teofilo Pod, MD at 12/25/15 1232                Author: Teofilo Pod, MD  Service: Internal Medicine  Author Type: Physician       Filed: 12/25/15 1232  Date of Service: 12/25/15 1232  Status: Signed          Editor: Teofilo Pod, MD (Physician)                       Discharge Summary           PATIENT ID: [name] [name]   MRN: [id number]     DATE OF BIRTH: 05/31/1920      DATE OF ADMISSION: 12/19/2015  2:57 PM      DATE OF DISCHARGE:  12/25/2015    PRIMARY CARE PROVIDER:  Kassie Mends, MD       ATTENDING PHYSICIAN: Dr Teofilo Pod   DISCHARGING PROVIDER:  Teofilo Pod, MD     To contact this individual call [phone] and ask the operator to page.  If unavailable ask to be transferred the Adult Hospitalist Department.      CONSULTATIONS: IP CONSULT TO CARDIOLOGY   IP CONSULT TO NEUROLOGY      PROCEDURES/SURGERIES: * No surgery found *      ADMITTING DIAGNOSES & HOSPITAL  COURSE:    Altered mental status likely secondary to metabolic encephalopathy vs acute stroke.   This is more likely due to metabolic encephalopathy in the setting of dementia   Initial CT head was negative. Carotid dopplers unremarkable   Echo: EF of 20- 25% with severe diffuse hypokinesis with distinct regional wall motion abnormalities.   -MRI not yet done due to combativeness of patient but not sure if it is going to help in the treatment as now the patient looks much better   - Put on his hearing aids to in order to reduce sensory impairments.   - Continue supportive care for now   - Avoid benzodiazepine's and benadryl   -MRI brain 12/13 Moderate chronic microvascular ischemic disease. No acute infarct. Limited by   motion artifact. Wife informed about the results      Chronic Ischemic cardiomyopathy: NYHA class unable to be determined   Echo with EF 20-25%   Hold Lasix.   Fluid restriction < 1800 cc/ day.  Continue aspirin, mag-oxide and zocor   Daily weight   -Appreciate cardiology      Dementia with acute  behavioral disturbance   Supportive care/one to one observation   Told the wife that in order for the patient to go to rehab, the patient has to remain sitter free. Wife agrees on staying in the hospital with the patient tonight      Hyponatremia: Fluid restriction to 1800 cc/day   monitor      Possible left lower lobe PNA:    -As evidenced by small left lower lobe infiltrate on CXR   On LVQ but don't think that the patient has pneumonia   No fever or leucocytosis   -LVQ stopped today      CAD s/p CABG x 6.   Resume home aspirin/statin      Chronic atrial fibrillation; Now rate controlled   Continue aspirin.      Elevated troponin: obtain 2 more set   EKG afib with non specific T wave abnormalities      Hypomagnesemia: replete with magnesium sulfate   Keep mag > 2  and K > 4      Reported apneic episode   -while the patient was sleeping.   -Would not do any workup regarding that. Spoke to the son in law in that regard and he agrees      Cardiac diet      PT/OT inpatient rehab, CM informed      Code status: DNR   DVT prophylaxis: Lovenox              DISCHARGE DIAGNOSES / P LAN:         1.     Altered mental state  sec to metabolic encephalopathy           PENDING TEST RESULTS:    At the time of discharge the following test results are still pending: none      FOLLOW UP APPOINTMENTS:       Follow-up Information        Follow up With  Details  Comments  Contact Info             Pat KocherBETH SHOLOM [facility])      [address]    [place] [id number]   [phone]                Jordan LikesAalya Crowl, MD  In 3 weeks    [address]   [address]   [place] [id number]   [phone]                Kassie MendsPatrick M Woodward, MD  In 1 week    [address]   [address]   [place] [id number]   [phone]                    ADDITIONAL CARE RECOMMENDATIONS:     Follow up with pMD in 1 week   Follow up with cardiology   Outpatient readdress by PMD on need for Lasix/Kcl      DIET: Cardiac Diet      ACTIVITY: Activity as tolerated          DISCHARGE MEDICATIONS:    See Medication Reconciliation Form         NOTIFY YOUR PHYSICIAN FOR ANY OF THE FOLLOWING:    Fever over 101 degrees for 24 hours.    Chest pain, shortness of breath, fever, chills, nausea, vomiting, diarrhea, change in mentation, falling, weakness, bleeding. Severe pain or pain not relieved by medications.   Or, any other signs or symptoms that you may have questions about.      DISPOSITION:         Home With:     OT    PT    HH    RN                   SNF/Inpatient Rehab/LTAC     x  Independent/assisted living          Hospice          Other:           PATIENT CONDITION AT DISCHARGE:       Functional status         Poor      x  Deconditioned           Independent         Cognition          Lucid        Forgetful  x  Dementia         Catheters/lines (plus indication)         Foley        PICC        PEG         x  None         Code status          Full code         x  DNR         PHYSICAL EXAMINATION AT DISCHARGE:   Please see progress note         CHRONIC MEDICAL DIAGNOSES:      Problem List as of 12/25/2015   Date Reviewed:  12/19/2015                Codes  Class  Noted - Resolved             Altered mental state  ICD-10-CM: R41.82   ICD-9-CM: 780.97    12/21/2015 - Present                       Chronic systolic heart failure (HCC) (Chronic)  ICD-10-CM: I50.22   ICD-9-CM: 428.22    12/20/2015 - Present                       * (Principal)Acute encephalopathy  ICD-10-CM: G93.40   ICD-9-CM: 348.30    12/19/2015 - Present                       Dementia (Chronic)  ICD-10-CM: F03.90   ICD-9-CM: 294.20    12/19/2015 - Present                       Mild dehydration  ICD-10-CM: E86.0   ICD-9-CM: 276.51    12/19/2015 - Present                       CAD (coronary artery disease) (Chronic)  ICD-10-CM: I25.10   ICD-9-CM: 414.00    12/19/2015 - Present                       Chronic a-fib (HCC) (Chronic)  ICD-10-CM: I48.2   ICD-9-CM: 427.31    12/19/2015 - Present                        Dislocation, hip (HCC) (Chronic)  ICD-10-CM: Z61.096ES73.006A   ICD-9-CM: 835.00    12/19/2015 - Present                       Hard of hearing (Chronic)  ICD-10-CM: H91.90   ICD-9-CM: 389.9    12/19/2015 - Present                       Cardiomyopathy (HCC) (Chronic)  ICD-10-CM: I42.9   ICD-9-CM: 425.4    12/19/2015 - Present                          Greater than 39 minutes were spent with the patient on counseling and coordination of care      Signed:    Teofilo PodPunit Neera Teng, MD   12/25/2015   12:32 PM

## 2015-12-26 LAB — URINALYSIS W/MICROSCOPIC
Bacteria: NEGATIVE /hpf
Blood: NEGATIVE
Glucose: NEGATIVE mg/dL
Ketone: NEGATIVE mg/dL
Nitrites: NEGATIVE
Protein: 30 mg/dL — AB
Specific gravity: 1.028 (ref 1.003–1.030)
Urobilinogen: 1 EU/dL (ref 0.2–1.0)
pH (UA): 5.5 (ref 5.0–8.0)

## 2015-12-26 LAB — BILIRUBIN, CONFIRM: Bilirubin UA, confirm: NEGATIVE

## 2015-12-26 MED ORDER — PANTOPRAZOLE 40 MG TAB, DELAYED RELEASE
40 mg | Freq: Every day | ORAL | Status: DC
Start: 2015-12-26 — End: 2015-12-29
  Administered 2015-12-26 – 2015-12-29 (×4): via ORAL

## 2015-12-26 MED ORDER — PRAVASTATIN 40 MG TAB
40 mg | Freq: Every evening | ORAL | Status: DC
Start: 2015-12-26 — End: 2015-12-29
  Administered 2015-12-26 – 2015-12-29 (×7): via ORAL

## 2015-12-26 MED ORDER — ACETAMINOPHEN 325 MG TABLET
325 mg | ORAL | Status: DC | PRN
Start: 2015-12-26 — End: 2015-12-29
  Administered 2015-12-26 – 2015-12-29 (×5): via ORAL

## 2015-12-26 MED ORDER — ASPIRIN 81 MG TAB, DELAYED RELEASE
81 mg | Freq: Every day | ORAL | Status: DC
Start: 2015-12-26 — End: 2015-12-29
  Administered 2015-12-26 – 2015-12-29 (×4): via ORAL

## 2015-12-26 MED ORDER — NITROGLYCERIN 0.4 MG SUBLINGUAL TAB
0.4 mg | SUBLINGUAL | Status: DC | PRN
Start: 2015-12-26 — End: 2015-12-29

## 2015-12-26 MED ORDER — ONDANSETRON 4 MG TAB, RAPID DISSOLVE
4 mg | Freq: Four times a day (QID) | ORAL | Status: DC | PRN
Start: 2015-12-26 — End: 2015-12-29

## 2015-12-26 MED ORDER — MAGNESIUM HYDROXIDE 400 MG/5 ML ORAL SUSP
400 mg/5 mL | Freq: Every day | ORAL | Status: DC | PRN
Start: 2015-12-26 — End: 2015-12-29

## 2015-12-26 MED ORDER — PHARMACY INFORMATION NOTE
Freq: Every day | Status: DC
Start: 2015-12-26 — End: 2015-12-29
  Administered 2015-12-26 – 2015-12-29 (×4)

## 2015-12-26 MED ORDER — POLYETHYLENE GLYCOL 3350 17 GRAM (100 %) ORAL POWDER PACKET
17 gram | Freq: Every day | ORAL | Status: DC
Start: 2015-12-26 — End: 2015-12-26
  Administered 2015-12-26: 14:00:00 via ORAL

## 2015-12-26 MED ORDER — ALUMINUM-MAGNESIUM HYDROXIDE 200 MG-200 MG/5 ML ORAL SUSP
200-200 mg/5 mL | ORAL | Status: DC | PRN
Start: 2015-12-26 — End: 2015-12-29

## 2015-12-26 MED ORDER — DOCUSATE SODIUM 100 MG CAP
100 mg | Freq: Two times a day (BID) | ORAL | Status: DC
Start: 2015-12-26 — End: 2015-12-29
  Administered 2015-12-26 – 2015-12-29 (×8): via ORAL

## 2015-12-26 MED ORDER — BISACODYL 10 MG RECTAL SUPPOSITORY
10 mg | Freq: Every day | RECTAL | Status: DC | PRN
Start: 2015-12-26 — End: 2015-12-29

## 2015-12-26 MED ORDER — MAGNESIUM OXIDE 400 MG TAB
400 mg | Freq: Every day | ORAL | Status: DC
Start: 2015-12-26 — End: 2015-12-29
  Administered 2015-12-26 – 2015-12-29 (×4): via ORAL

## 2015-12-26 MED FILL — ASPIRIN 81 MG TAB, DELAYED RELEASE: 81 mg | ORAL | Qty: 1

## 2015-12-26 MED FILL — PRAVASTATIN 40 MG TAB: 40 mg | ORAL | Qty: 2

## 2015-12-26 MED FILL — MAPAP (ACETAMINOPHEN) 325 MG TABLET: 325 mg | ORAL | Qty: 2

## 2015-12-26 MED FILL — DOK 100 MG CAPSULE: 100 mg | ORAL | Qty: 1

## 2015-12-26 MED FILL — PHARMACY INFORMATION NOTE: Qty: 1

## 2015-12-26 MED FILL — MAGNESIUM OXIDE 400 MG TAB: 400 mg | ORAL | Qty: 1

## 2015-12-26 MED FILL — HEALTHYLAX 17 GRAM ORAL POWDER PACKET: 17 gram | ORAL | Qty: 1

## 2015-12-26 MED FILL — PROTONIX 40 MG TABLET,DELAYED RELEASE: 40 mg | ORAL | Qty: 1

## 2015-12-27 LAB — METABOLIC PANEL, COMPREHENSIVE
A-G Ratio: 0.9 — ABNORMAL LOW (ref 1.1–2.2)
ALT (SGPT): 53 U/L (ref 12–78)
AST (SGOT): 84 U/L — ABNORMAL HIGH (ref 15–37)
Albumin: 2.9 g/dL — ABNORMAL LOW (ref 3.5–5.0)
Alk. phosphatase: 61 U/L (ref 45–117)
Anion gap: 6 mmol/L (ref 5–15)
BUN/Creatinine ratio: 36 — ABNORMAL HIGH (ref 12–20)
BUN: 26 MG/DL — ABNORMAL HIGH (ref 6–20)
Bilirubin, total: 0.7 MG/DL (ref 0.2–1.0)
CO2: 25 mmol/L (ref 21–32)
Calcium: 8.4 MG/DL — ABNORMAL LOW (ref 8.5–10.1)
Chloride: 103 mmol/L (ref 97–108)
Creatinine: 0.72 MG/DL (ref 0.70–1.30)
GFR est AA: >60 mL/min/{1.73_m2} (ref >60)
GFR est non-AA: >60 mL/min/{1.73_m2} (ref >60)
Globulin: 3.4 g/dL (ref 2.0–4.0)
Glucose: 92 mg/dL (ref 65–100)
Potassium: 4.3 mmol/L (ref 3.5–5.1)
Protein, total: 6.3 g/dL — ABNORMAL LOW (ref 6.4–8.2)
Sodium: 134 mmol/L — ABNORMAL LOW (ref 136–145)

## 2015-12-27 LAB — CBC W/O DIFF
HCT: 37.3 % (ref 36.6–50.3)
HGB: 12.9 g/dL (ref 12.1–17.0)
MCH: 31.9 PG (ref 26.0–34.0)
MCHC: 34.6 g/dL (ref 30.0–36.5)
MCV: 92.1 FL (ref 80.0–99.0)
PLATELET: 316 10*3/uL (ref 150–400)
RBC: 4.05 M/uL — ABNORMAL LOW (ref 4.10–5.70)
RDW: 13.1 % (ref 11.5–14.5)
WBC: 9.1 10*3/uL (ref 4.1–11.1)

## 2015-12-27 LAB — MAGNESIUM: Magnesium: 2 mg/dL (ref 1.6–2.4)

## 2015-12-27 LAB — VITAMIN D, 25 HYDROXY: Vitamin D 25-Hydroxy: 48.9 ng/mL (ref 30–100)

## 2015-12-27 MED ORDER — MELATONIN 3 MG TAB
3 mg | Freq: Every evening | ORAL | Status: DC
Start: 2015-12-27 — End: 2015-12-29
  Administered 2015-12-28 – 2015-12-29 (×3): via ORAL

## 2015-12-27 MED ORDER — TRAZODONE 50 MG TAB
50 mg | Freq: Every evening | ORAL | Status: DC | PRN
Start: 2015-12-27 — End: 2015-12-29

## 2015-12-27 MED FILL — DOK 100 MG CAPSULE: 100 mg | ORAL | Qty: 1

## 2015-12-27 MED FILL — MAPAP (ACETAMINOPHEN) 325 MG TABLET: 325 mg | ORAL | Qty: 2

## 2015-12-27 MED FILL — ASPIRIN 81 MG TAB, DELAYED RELEASE: 81 mg | ORAL | Qty: 1

## 2015-12-27 MED FILL — PROTONIX 40 MG TABLET,DELAYED RELEASE: 40 mg | ORAL | Qty: 1

## 2015-12-27 MED FILL — MAGNESIUM OXIDE 400 MG TAB: 400 mg | ORAL | Qty: 1

## 2015-12-28 MED ORDER — HYDROCORTISONE 2.5 % TOPICAL CREAM
2.5 % | Freq: Three times a day (TID) | CUTANEOUS | Status: DC
Start: 2015-12-28 — End: 2015-12-29
  Administered 2015-12-29: 18:00:00 via TOPICAL

## 2015-12-28 MED FILL — PRAVASTATIN 40 MG TAB: 40 mg | ORAL | Qty: 2

## 2015-12-28 MED FILL — DOK 100 MG CAPSULE: 100 mg | ORAL | Qty: 1

## 2015-12-28 MED FILL — MAPAP (ACETAMINOPHEN) 325 MG TABLET: 325 mg | ORAL | Qty: 2

## 2015-12-28 MED FILL — PROTONIX 40 MG TABLET,DELAYED RELEASE: 40 mg | ORAL | Qty: 1

## 2015-12-28 MED FILL — MAGNESIUM OXIDE 400 MG TAB: 400 mg | ORAL | Qty: 1

## 2015-12-28 MED FILL — ASPIRIN 81 MG TAB, DELAYED RELEASE: 81 mg | ORAL | Qty: 1

## 2015-12-28 MED FILL — HYDROCORTISONE 2.5 % TOPICAL CREAM: 2.5 % | CUTANEOUS | Qty: 28

## 2015-12-28 MED FILL — MELATONIN 3 MG TAB: 3 mg | ORAL | Qty: 2

## 2015-12-29 LAB — METABOLIC PANEL, BASIC
Anion gap: 5 mmol/L (ref 5–15)
BUN/Creatinine ratio: 29 — ABNORMAL HIGH (ref 12–20)
BUN: 21 MG/DL — ABNORMAL HIGH (ref 6–20)
CO2: 26 mmol/L (ref 21–32)
Calcium: 8.2 MG/DL — ABNORMAL LOW (ref 8.5–10.1)
Chloride: 101 mmol/L (ref 97–108)
Creatinine: 0.73 MG/DL (ref 0.70–1.30)
GFR est AA: >60 mL/min/{1.73_m2} (ref >60)
GFR est non-AA: >60 mL/min/{1.73_m2} (ref >60)
Glucose: 88 mg/dL (ref 65–100)
Potassium: 4.9 mmol/L (ref 3.5–5.1)
Sodium: 132 mmol/L — ABNORMAL LOW (ref 136–145)

## 2015-12-29 MED ORDER — POTASSIUM CHLORIDE SR 10 MEQ TAB
10 mEq | ORAL | Status: AC
Start: 2015-12-29 — End: 2015-12-29
  Administered 2015-12-29: 17:00:00 via ORAL

## 2015-12-29 MED ORDER — FUROSEMIDE 20 MG TAB
20 mg | Freq: Once | ORAL | Status: AC
Start: 2015-12-29 — End: 2015-12-29
  Administered 2015-12-29: 17:00:00 via ORAL

## 2015-12-29 MED FILL — PRAVASTATIN 40 MG TAB: 40 mg | ORAL | Qty: 2

## 2015-12-29 MED FILL — ASPIRIN 81 MG TAB, DELAYED RELEASE: 81 mg | ORAL | Qty: 1

## 2015-12-29 MED FILL — MELATONIN 3 MG TAB: 3 mg | ORAL | Qty: 2

## 2015-12-29 MED FILL — MAPAP (ACETAMINOPHEN) 325 MG TABLET: 325 mg | ORAL | Qty: 2

## 2015-12-29 MED FILL — PROTONIX 40 MG TABLET,DELAYED RELEASE: 40 mg | ORAL | Qty: 1

## 2015-12-29 MED FILL — DOK 100 MG CAPSULE: 100 mg | ORAL | Qty: 1

## 2015-12-29 MED FILL — MAGNESIUM OXIDE 400 MG TAB: 400 mg | ORAL | Qty: 1

## 2015-12-29 MED FILL — POTASSIUM CHLORIDE SR 10 MEQ TAB: 10 mEq | ORAL | Qty: 1

## 2015-12-29 MED FILL — FUROSEMIDE 20 MG TAB: 20 mg | ORAL | Qty: 1

## 2016-01-02 NOTE — Progress Notes (Deleted)
Troy Freeman Date of Birth: July 22, 1920 Medical Record [id number]  History of Present Illness: [name] is seen for a followup visit. He is 89. He is seen with his wife and is in a wheelchair. He has known CAD with LV dysfunction. He has been managed conservatively. His other problems includes chronic atrial fib, coumadin therapy and progressive dementia. His wife provides around the clock care for him. He is  hard of hearing.  He has a history of severe bradycardia on beta blockers and hypotension on ACEi/ARB therapy. He has remote history of anterior MI and prior CABG.   On follow up today he is doing OK.  No new issues. Still limited in ability to ambulate. Walks short distances with walker.  He fell yesterday. was seen by Dr. Alvan Dame today and both knees injected. No complaints of dyspnea or chest pain.  His swelling is doing well.   Current Outpatient Prescriptions on File Prior to Visit  Medication Sig Dispense Refill  . Acetaminophen (TYLENOL 8 HOUR PO) Take 1,000 mg by mouth 2 (two) times daily. Reported on 07/09/2015    . Calcium Carbonate-Vitamin D (CALCIUM + D PO) Take 600 mg by mouth daily. 3 TABS DAILY     . furosemide (LASIX) 20 MG tablet TAKE ONE TABLET ONCE A DAY- TAKE ONLY IF WEIGHT GAIN OF 4 LBS IN 24 HOURS. 30 tablet 0  . magnesium oxide (MAG-OX) 400 MG tablet Take 400 mg by mouth daily.    . nitroGLYCERIN (NITROSTAT) 0.4 MG SL tablet Place 1 tablet (0.4 mg total) under the tongue every 5 (five) minutes as needed for chest pain. 25 tablet 3  . omeprazole (PRILOSEC) 20 MG capsule Take 1 capsule (20 mg total) by mouth daily. (For Stomach Acid) 90 capsule 3  . potassium chloride (K-DUR) 10 MEQ tablet TAKE ONE TABLET TWICE A DAY ONLY WHEN TAKING FUROSEMIDE. 60 tablet 2  . simvastatin (ZOCOR) 40 MG tablet TAKE 1 TABLET ONCE DAILY FOR CHOLESTEROL. 90 tablet 0  . warfarin (COUMADIN) 1 MG tablet Take 4 tabs on M, W, F, Take 3 tabs on Tue, Thur, Sat, and Sun. 99 tablet 11   No  current facility-administered medications on file prior to visit.     Allergies  Allergen Reactions  . Aricept [Donepezil]     lethargy  . Codeine Nausea And Vomiting  . Coreg [Carvedilol] Other (See Comments)    Bradycardia   . Penicillins Rash  . Tramadol Nausea Only    Past Medical History:  Diagnosis Date  . Abnormality of gait   . Anemia, unspecified   . Arthritis    Osteoarthritis  . Atrial fibrillation ([place])    managed on coumadin and with rate control  . BPH (benign prostatic hyperplasia)   . Bradycardia   . Chronic anticoagulation   . Congestive heart failure, unspecified   . Dementia    mild  . Fall Aug 2012   with fractured right hip  . GERD (gastroesophageal reflux disease)   . Hyperlipidemia   . Hypertension   . IHD (ischemic heart disease) July 2002   Large anterior MI with PCI to LAD with subsequent CABG x 6 in 2002  . Long term (current) use of anticoagulants   . LV dysfunction    EF is 30 to 35%; does not tolerate ACE due to cough and dizziness  . MI, old    OLD ANTERIOR MI  . Osteoarthrosis, unspecified whether generalized or localized, unspecified site   .  Other B-complex deficiencies   . S/P CABG (coronary artery bypass graft) 2002  . Senile osteoporosis   . Syncope 09/11/2008  . Unspecified vitamin D deficiency     Past Surgical History:  Procedure Laterality Date  . COLON SURGERY  2004   Colonoscopy  . COLON SURGERY  10/29/2003   Colonoscopy  . CORONARY ANGIOPLASTY  07/2000   LAD  . CORONARY ARTERY BYPASS GRAFT  08/2000   LIMA to LAD, SVG to RCA and PD, SVG to OM1 and OM2, and SVG to DX  . ESOPHAGOGASTRODUODENOSCOPY ENDOSCOPY    . EYE SURGERY  2001   Cataract surgery  . EYE SURGERY  2006   Cataract surgery  . KNEE ARTHROSCOPY Right    Arthroscopic surgery  . Left hip surgery Left 04/18/2005   Total replacement  . Right hip surgery Right 1996 & 2012   Replacement    History  Smoking Status  . Former Smoker  . Packs/day:  1.00  . Years: 30.00  . Types: Cigarettes  . Quit date: 01/09/1958  Smokeless Tobacco  . Never Used    History  Alcohol Use No    Family History  Problem Relation Age of Onset  . Cancer Mother   . Stroke Mother   . Heart disease Father   . Stroke Father   . Cancer Sister   . Cancer Brother   . Early death Brother     Review of Systems: The review of systems is per the HPI.  All other systems were reviewed and are negative.  Physical Exam: There were no vitals taken for this visit. Patient is very pleasant and in no acute distress. He is quite hard of hearing. Skin is warm and dry. Color is normal.  HEENT is unremarkable. Normocephalic/atraumatic. PERRL. Sclera are nonicteric. Neck is supple. No masses. No JVD. Lungs are clear. Cardiac exam shows an irregular rhythm. His rate is controlled.  Old sternotomy scar. Normal S1-2 without murmur. Abdomen is soft. Extremities reveal tr edema. Gait and ROM are intact. He is using a walker. No gross neurologic deficits noted.  LABORATORY DATA:    Lab Results  Component Value Date   WBC 7.2 07/19/2015   HGB 13.9 07/19/2015   HCT 40.6 07/19/2015   PLT 315 07/19/2015   GLUCOSE 90 09/10/2015   CHOL 134 07/19/2015   TRIG 112 07/19/2015   HDL 52 07/19/2015   LDLCALC 60 07/19/2015   ALT 22 07/19/2015   AST 30 07/19/2015   NA 132 (L) 09/10/2015   K 4.3 09/10/2015   CL 98 09/10/2015   CREATININE 0.89 09/10/2015   BUN 11 09/10/2015   CO2 24 09/10/2015   TSH 3.488 08/21/2010   INR 2.1 (H) 11/08/2015   HGBA1C 6.1 (H) 07/19/2015      Assessment / Plan: 1. Bradycardia -resolved since beta blocker discontinued.   2. CAD -old anterior MI. s/p CABG- no chest pain. Conservative therapy.  3. Atrial fib - rate is controlled. He is on chronic anticoagulation with coumadin. INR therapeutic.  4. Ischemic cardiomyopathy EF 35%. Asymptomatic. Intolerant of meds as noted above. Continue to monitor. Use lasix prn swelling. Restrict sodium  intake.   I will follow up in 6 months.

## 2016-01-06 ENCOUNTER — Ambulatory Visit: Payer: Medicare Other | Admitting: Cardiology

## 2016-07-30 ENCOUNTER — Encounter: Primary: Family Medicine

## 2016-07-31 ENCOUNTER — Encounter: Admit: 2016-07-31 | Discharge: 2016-07-31 | Payer: MEDICARE | Primary: Family Medicine

## 2016-08-02 ENCOUNTER — Encounter: Primary: Family Medicine

## 2016-08-07 ENCOUNTER — Encounter: Primary: Family Medicine

## 2016-08-10 ENCOUNTER — Encounter: Primary: Family Medicine

## 2016-08-12 ENCOUNTER — Encounter: Primary: Family Medicine

## 2016-08-14 ENCOUNTER — Encounter: Primary: Family Medicine

## 2016-08-17 ENCOUNTER — Encounter: Primary: Family Medicine

## 2016-08-21 ENCOUNTER — Encounter: Primary: Family Medicine

## 2016-08-23 ENCOUNTER — Encounter: Primary: Family Medicine

## 2017-06-14 ENCOUNTER — Ambulatory Visit: Attending: Family Medicine | Primary: Family Medicine

## 2017-06-14 ENCOUNTER — Ambulatory Visit: Admit: 2017-06-14 | Discharge: 2017-06-14 | Payer: MEDICARE | Attending: Family Medicine | Primary: Family Medicine

## 2017-06-14 DIAGNOSIS — Z0000 Encounter for general adult medical examination without abnormal findings: Secondary | ICD-10-CM

## 2017-06-14 NOTE — Patient Instructions (Signed)
Well Visit, Over 65: Care Instructions  Your Care Instructions    Physical exams can help you stay healthy. Your doctor has checked your overall health and may have suggested ways to take good care of yourself. He or she also may have recommended tests. At home, you can help prevent illness with healthy eating, regular exercise, and other steps.  Follow-up care is a key part of your treatment and safety. Be sure to make and go to all appointments, and call your doctor if you are having problems. It's also a good idea to know your test results and keep a list of the medicines you take.  How can you care for yourself at home?  ?? Reach and stay at a healthy weight. This will lower your risk for many problems, such as obesity, diabetes, heart disease, and high blood pressure.  ?? Get at least 30 minutes of exercise on most days of the week. Walking is a good choice. You also may want to do other activities, such as running, swimming, cycling, or playing tennis or team sports.  ?? Do not smoke. Smoking can make health problems worse. If you need help quitting, talk to your doctor about stop-smoking programs and medicines. These can increase your chances of quitting for good.  ?? Protect your skin from too much sun. When you're outdoors from 10 a.m. to 4 p.m., stay in the shade or cover up with clothing and a hat with a wide brim. Wear sunglasses that block UV rays. Even when it's cloudy, put broad-spectrum sunscreen (SPF 30 or higher) on any exposed skin.  ?? See a dentist one or two times a year for checkups and to have your teeth cleaned.  ?? Wear a seat belt in the car.  ?? Limit alcohol to 2 drinks a day for men and 1 drink a day for women. Too much alcohol can cause health problems.  Follow your doctor's advice about when to have certain tests. These tests can spot problems early.  For men and women  ?? Cholesterol. Your doctor will tell you how often to have this done based  on your overall health and other things that can increase your risk for heart attack and stroke.  ?? Blood pressure. Have your blood pressure checked during a routine doctor visit. Your doctor will tell you how often to check your blood pressure based on your age, your blood pressure results, and other factors.  ?? Diabetes. Ask your doctor whether you should have tests for diabetes.  ?? Vision. Experts recommend that you have yearly exams for glaucoma and other age-related eye problems.  ?? Hearing. Tell your doctor if you notice any change in your hearing. You can have tests to find out how well you hear.  ?? Colon cancer tests. Keep having colon cancer tests as your doctor recommends. You can have one of several types of tests.  ?? Heart attack and stroke risk. At least every 4 to 6 years, you should have your risk for heart attack and stroke assessed. Your doctor uses factors such as your age, blood pressure, cholesterol, and whether you smoke or have diabetes to show what your risk for a heart attack or stroke is over the next 10 years.  ?? Osteoporosis. Talk to your doctor about whether you should have a bone density test to find out whether you have thinning bones. Also ask your doctor about whether you should take calcium and vitamin D supplements.  For women  ??   Pap test and pelvic exam. You may no longer need a Pap test. Talk with your doctor about whether to stop or continue to have Pap tests.  ?? Breast exam and mammogram. Ask how often you should have a mammogram, which is an X-ray of your breasts. A mammogram can spot breast cancer before it can be felt and when it is easiest to treat.  ?? Thyroid disease. Talk to your doctor about whether to have your thyroid checked as part of a regular physical exam. Women have an increased chance of a thyroid problem.  For men  ?? Prostate exam. Talk to your doctor about whether you should have a blood test (called a PSA test) for prostate cancer. Experts disagree on whether  men should have this test. Some experts recommend that you discuss the benefits and risks of the test with your doctor.  ?? Abdominal aortic aneurysm. Ask your doctor whether you should have a test to check for an aneurysm. You may need a test if you ever smoked or if your parent, brother, sister, or child has had an aneurysm.  When should you call for help?  Watch closely for changes in your health, and be sure to contact your doctor if you have any problems or symptoms that concern you.  Where can you learn more?  Go to http://www.healthwise.net/[employer].  Enter K859 in the search box to learn more about "Well Visit, Over 65: Care Instructions."  Current as of: April 05, 2016  Content Version: 11.9  ?? 2006-2018 [employer]. Care instructions adapted under license by [employer] (which disclaims liability or warranty for this information). If you have questions about a medical condition or this instruction, always ask your healthcare professional. Healthwise, Incorporated disclaims any warranty or liability for your use of this information.

## 2017-06-14 NOTE — Telephone Encounter (Signed)
[name] [name] is calling [facility] requesting the last office note and most recent labs to be fax at # [phone]    Best call# [phone]

## 2017-06-14 NOTE — Progress Notes (Signed)
[facility]  [address]  [place], [place] [address]  [phone]             Date of visit: 06/14/2017       This is a Subsequent Medicare Annual Wellness Visit (AWV), (Performed more than 12 months after effective date of Medicare Part B enrollment and 12 months after last preventive visit)    I have reviewed the patient's medical history in detail and updated the computerized patient record.     [name] [name] is a 84 y.o. male   History obtained from: patient, daughter Clancy Gourd, wife.    Concerns today   (Patient understands that medical problems addressed today may incur additional cost as this is a preventive visit)  -wheelchair bound for about 6 months. Hip coming out of socket.  Hard time transfer  Really bad pain with any movement  -has CHF and had a 6v bypass 12 years ago  Needs help getting to bathroom, uses urinal and bedside commode. They have equipment at home  Has been a long time  Narcotics make him sick  Has tried [facility] PT/[facility] but didn't go well, he didn't want to participate    Little episodes wife thinks may have had a slight stroke, sometimes confused  2-3 episodes like that but better after 24 hours  When they took him to hospital he just got more confused    Memory problems for 5-6 years  He denies it but wife and daughter agree.  Little irritable in the evenings at times.  Mostly happy, cheerful  Gets bored and can't do anything    Denies chest pain  Little wheezy with transfers  Some swelling in legs   Uses lasix  Used to use lasix prn by weight but can't weigh anymore  Wife gives it to him when legs are especially swollen    Tried myrbetriq was expensive and didn't work  Still getting up to urinate at night, frequently  Not so much during the day  Hasn't worn support hose but might be willing to try it if they can get them on.    Saw cardiology 2 weeks ago, stable a-fib, no changes made     Was seeing Dr. Kate Sable but too far away. Will ask for records to be sent.  Had moved from [place] just 1.5 years ago    History     Patient Active Problem List   Diagnosis Code   ??? Acute encephalopathy G93.40   ??? Dementia F03.90   ??? Mild dehydration E86.0   ??? CAD (coronary artery disease) I25.10   ??? Chronic a-fib (HCC) I48.2   ??? Dislocation, hip (HCC) S73.006A   ??? Hard of hearing H91.90   ??? Cardiomyopathy (HCC) I42.9   ??? Chronic systolic heart failure (HCC) I50.22   ??? Altered mental state R41.82     Past Medical History:   Diagnosis Date   ??? CAD (coronary artery disease)    ??? Dehydration with hyponatremia    ??? Ill-defined condition     MI 14 years ago    ??? Ill-defined condition     a fib      Past Surgical History:   Procedure Laterality Date   ??? HX ORTHOPAEDIC      left hip permantly dislocated     Allergies   Allergen Reactions   ??? Codeine Nausea Only   ??? Penicillins Shortness of Breath     Current Outpatient Medications  Medication Sig Dispense Refill   ??? BABY ASPIRIN PO Take 81 mg by mouth daily.     ??? calcium carbonate/vitamin D3 (CALCIUM 600 + D,3, PO) Take 3 Tabs by mouth daily.     ??? acetaminophen (TYLENOL) 500 mg tablet Take 650 mg by mouth every six (6) hours as needed for Pain.     ??? simvastatin (ZOCOR) 40 mg tablet Take 40 mg by mouth nightly.     ??? omeprazole (PRILOSEC) 20 mg capsule Take 20 mg by mouth daily.     ??? potassium chloride SR (KLOR-CON 10) 10 mEq tablet Take 10 mEq by mouth two (2) times a day.     ??? furosemide (LASIX) 20 mg tablet Take 20 mg by mouth daily. Indications: Only when Sweling       Family History   Problem Relation Age of Onset   ??? No Known Problems Mother    ??? Alzheimer Father    ??? Coronary Artery Disease Father    ??? Stroke Father      Social History     Tobacco Use   ??? Smoking status: Never Smoker   ??? Smokeless tobacco: Never Used   Substance Use Topics   ??? Alcohol use: Not Currently       Specialists/Care Team    Hendricks MiloMatthew V. Sammuel HinesWimberley has established care with the following healthcare providers:  Patient Care Team:  Perian Tedder, Debbora PrestoLindsay H, MD as PCP - General (Family Practice)  Jordan Likesrowl, Aalya, MD (Cardiology)    Health Risk Assessment     Demographics   male  84 y.o.    General Health Questions   -During the past 4 weeks:   -how would you rate your health in general? Good   -how often have you been bothered by feeling dizzy when standing up? occasionally, has to get up slowly per wife   -how much have you been bothered by bodily pain? Moderately to severely   -Have you noticed any hearing difficulties? yes   -has your physical and emotional health limited your social activities with family or friends? yes    Emotional Health Questions   -Do you have a history of depression, anxiety, or emotional problems? no  3 most recent PHQ Screens 06/14/2017   Little interest or pleasure in doing things Not at all   Feeling down, depressed, irritable, or hopeless Not at all   Total Score PHQ 2 0      Health Habits   Please describe your diet habits: eats well, good variety  Do you get 5 servings of fruits or vegetables daily? Not sure  Do you exercise regularly? no    Activities of Daily Living and Functional Status   -Do you need help with eating, walking, dressing, bathing, toileting, the phone, transportation, shopping, preparing meals, housework, laundry, medications or managing money? Yes, needs help toileting, dressing, bathing, all of the above  -In the past four weeks, was someone available to help you if you needed and wanted help with anything? Yes, wife, they are looking for additional help  -Are you confident are you that you can control and manage most of your health problems? yes and n/a  -Have you been given information to help you keep track of your medications? yes and n/a  -How often do you have trouble taking your medications as prescribed? never    Fall Risk and Home Safety    Have you fallen 2 or more times in the past year? yes  Does  your home have rugs in the hallway, lack grab bars in the bathroom, lack handrails on the stairs or have poor lighting? no  Do you have smoke detectors and check them regularly? yes  Do you have difficulties driving a car? Doesn't drive  Do you always fasten your seat belt when you are in a car? yes    Review of Systems (if indicated for problems addressed today)   Gen: denies fever   Card: denies chest pain  GU denies urinary hesitancy  GI denies bowel changes  Skin: denies rash  MSK: admits to severe joint pains as above  Neuro: admits to dizziness at times when sittin gup  ENT denies cold symptoms  All: denies allergies  Pulm: denies shortness of breath     Physical Examination     Vitals:    06/14/17 1457 06/14/17 1556   BP: 146/74 136/70   Pulse: 68    Resp: 16    Temp: 98.8 ??F (37.1 ??C)    TempSrc: Oral    SpO2: 94%    Height: 6\' 1"  (1.854 m)      Body mass index is 24.28 kg/m??.   No exam data present  Was the patient's timed Up & Go test unsteady or longer than 30 seconds? Yes, in wheelchair all the time    Evaluation of Cognitive Function   Mood/affect:  happy  Orientation: to situation at least  Appearance: age appropriate  Family member/caregiver input: wife and daughter agree his memory is slowly declining over more than 5 years    Additional exam if indicated for problems addressed today:  General: stated age, well developed, well nourished and in NAD, in wheelchair  Neck: supple, symmetrical, trachea midline, no adenopathy and thyroid: not enlarged, symmetric, no tenderness/mass/nodules  Lungs:  clear to auscultation w/o rales, rhonchi, wheezes w/normal effort and no use of accessory muscles of respiration   Heart: irregularly irregular rate and rhythm, S1, S2 normal, no murmur, click, rub or gallop  Abdomen: soft, nontender, no masses  Ext:  2+ BLE pitting edema noted.   Lymph: no cervical adenopathy appreciated   Skin:  Normal. and no rash or abnormalities   Psych: alert and oriented to person, place, time and situation and Speech: appropriate quality, quantity and organization of sentences     Advice/Referrals/Counseling (as indicated)   Education and counseling provided for any problems identified above: safety, fall prevention    Preventive Services     Health Maintenance   Topic Date Due   ??? DTaP/Tdap/Td series (1 - Tdap) 03/24/1941   ??? Shingrix Vaccine Age 39> (1 of 2) 03/23/1972   ??? GLAUCOMA SCREENING Q2Y  03/24/1987   ??? Pneumococcal 65+ years (1 of 2 - PCV13) 03/24/1987   ??? MEDICARE YEARLY EXAM  03/22/2016   ??? Influenza Age 54 to Adult  08/09/2017     (Preventive care checklist to be included in patient instructions)  Discussed today Done Previously Not Needed    ? records   Pneumococcal vaccines    x  Flu vaccine     x Hepatitis B vaccine (if at risk)   ? records   Shingles vaccine   ? records   TDAP vaccine     x Digital rectal exam     x PSA     x Colorectal cancer screening     x Low-dose CT for lung cancer screening   ? records   Bone density test    x  Glaucoma  screening    x  Cholesterol test   ? records X (labs ok 1 month ago)  Diabetes screening test      x Diabetes self-management class     x Nutritionist referral for diabetes or renal disease     Discussion of Advance Directive   Discussed with Hendricks Milo. Kadel his ability to prepare and advance directive in the case that an injury or illness causes him to be unable to make health care decisions.   On file already    Assessment/Plan   Z00.00    ICD-10-CM ICD-9-CM    1. Medicare annual wellness visit, subsequent Z00.00 V70.0    2. Cardiomyopathy, unspecified type (HCC) I42.9 425.4 REFERRAL TO HOSPICE   3. Chronic a-fib (HCC) I48.2 427.31 REFERRAL TO HOSPICE   4. Dislocation of right hip, initial encounter (HCC) S73.004A 835.00 REFERRAL TO HOSPICE   5. Coronary artery disease involving native heart without angina pectoris,  unspecified vessel or lesion type I25.10 414.01 REFERRAL TO HOSPICE   6. Hearing loss, unspecified hearing loss type, unspecified laterality H91.90 389.9 REFERRAL TO HOSPICE   7. Chronic systolic heart failure (HCC) I50.22 428.22 REFERRAL TO HOSPICE   8. Late onset Alzheimer's disease without behavioral disturbance G30.1 331.0 REFERRAL TO HOSPICE    F02.80 294.10      Getting to know him and need records  Not a lot of preventive measures needed at his age, but we should evaluate shots and dexa  That being said, he is very much declining with his dementia and with severe pain of inoperable hip dislocation  Has had side effects with narcotics, and I doubt they would help much. His pain is only when he moves around  However, this has really limited his activity in past 6 months, now wheelchair bound  Was not able to participate in PT due to dementia.  CHF seems stable, just saw cardiologist.     After discussion of their needs I recommended hospice referral  Wife doing all of his care, including bathing him. She is in her 90s and it is really too much.   However, not wanting much help  They are not wanting aggressive interventions, want him to be comfortable and happy at home.    Orders Placed This Encounter   ??? REFERRAL TO HOSPICE     F/u here 6-12 months depending on how he is doing       Lamar Laundry, MD

## 2017-06-14 NOTE — Progress Notes (Signed)
Chief Complaint   Patient presents with   ??? New Patient     Establish care     1. Have you been to the ER, urgent care clinic since your last visit?  Hospitalized since your last visit?No    2. Have you seen or consulted any other health care providers outside of the  Health System since your last visit?  Include any pap smears or colon screening. No

## 2017-06-14 NOTE — ACP (Advance Care Planning) (Signed)
Discussed with [name] [name] his ability to prepare and advance directive in the case that an injury or illness causes him to be unable to make health care decisions.   On file already

## 2017-06-14 NOTE — Progress Notes (Signed)
 Chief Complaint   Patient presents with   . New Patient     Establish care     1. Have you been to the ER, urgent care clinic since your last visit?  Hospitalized since your last visit?No    2. Have you seen or consulted any other health care providers outside of the [facility] since your last visit?  Include any pap smears or colon screening. No

## 2017-06-14 NOTE — Progress Notes (Signed)
[facility]  [address]  [place], [place] [address]  [phone]             Date of visit: 06/14/2017       This is a Subsequent Medicare Annual Wellness Visit (AWV), (Performed more than 12 months after effective date of Medicare Part B enrollment and 12 months after last preventive visit)    I have reviewed the patient's medical history in detail and updated the computerized patient record.     FRANCISO DIERKS is a 84 y.o. male   History obtained from: patient, daughter Clancy Gourd, wife.    Concerns today   (Patient understands that medical problems addressed today may incur additional cost as this is a preventive visit)  -wheelchair bound for about 6 months. Hip coming out of socket.  Hard time transfer  Really bad pain with any movement  -has CHF and had a 6v bypass 12 years ago  Needs help getting to bathroom, uses urinal and bedside commode. They have equipment at home  Has been a long time  Narcotics make him sick  Has tried [facility] PT/OT but didn't go well, he didn't want to participate    Little episodes wife thinks may have had a slight stroke, sometimes confused  2-3 episodes like that but better after 24 hours  When they took him to hospital he just got more confused    Memory problems for 5-6 years  He denies it but wife and daughter agree.  Little irritable in the evenings at times.  Mostly happy, cheerful  Gets bored and can't do anything    Denies chest pain  Little wheezy with transfers  Some swelling in legs   Uses lasix  Used to use lasix prn by weight but can't weigh anymore  Wife gives it to him when legs are especially swollen    Tried myrbetriq was expensive and didn't work  Still getting up to urinate at night, frequently  Not so much during the day  Hasn't worn support hose but might be willing to try it if they can get them on.    Saw cardiology 2 weeks ago, stable a-fib, no changes made    Was seeing Dr. Kate Sable but too far away. Will ask for records  to be sent.  Had moved from [place] just 1.5 years ago    History     Patient Active Problem List   Diagnosis Code   ??? Acute encephalopathy G93.40   ??? Dementia F03.90   ??? Mild dehydration E86.0   ??? CAD (coronary artery disease) I25.10   ??? Chronic a-fib (HCC) I48.2   ??? Dislocation, hip (HCC) S73.006A   ??? Hard of hearing H91.90   ??? Cardiomyopathy (HCC) I42.9   ??? Chronic systolic heart failure (HCC) I50.22   ??? Altered mental state R41.82     Past Medical History:   Diagnosis Date   ??? CAD (coronary artery disease)    ??? Dehydration with hyponatremia    ??? Ill-defined condition     MI 14 years ago    ??? Ill-defined condition     a fib      Past Surgical History:   Procedure Laterality Date   ??? HX ORTHOPAEDIC      left hip permantly dislocated     Allergies   Allergen Reactions   ??? Codeine Nausea Only   ??? Penicillins Shortness of Breath     Current Outpatient Medications  Medication Sig Dispense Refill   ??? BABY ASPIRIN PO Take 81 mg by mouth daily.     ??? calcium carbonate/vitamin D3 (CALCIUM 600 + D,3, PO) Take 3 Tabs by mouth daily.     ??? acetaminophen (TYLENOL) 500 mg tablet Take 650 mg by mouth every six (6) hours as needed for Pain.     ??? simvastatin (ZOCOR) 40 mg tablet Take 40 mg by mouth nightly.     ??? omeprazole (PRILOSEC) 20 mg capsule Take 20 mg by mouth daily.     ??? potassium chloride SR (KLOR-CON 10) 10 mEq tablet Take 10 mEq by mouth two (2) times a day.     ??? furosemide (LASIX) 20 mg tablet Take 20 mg by mouth daily. Indications: Only when Sweling       Family History   Problem Relation Age of Onset   ??? No Known Problems Mother    ??? Alzheimer Father    ??? Coronary Artery Disease Father    ??? Stroke Father      Social History     Tobacco Use   ??? Smoking status: Never Smoker   ??? Smokeless tobacco: Never Used   Substance Use Topics   ??? Alcohol use: Not Currently       Specialists/Care Team   Tally Mckinnon. Schwager has established care with the following healthcare providers:  Patient Care Team:  Damiah Mcdonald, Debbora Presto, MD as  PCP - General (Family Practice)  Jordan Likes, MD (Cardiology)    Health Risk Assessment     Demographics   male  84 y.o.    General Health Questions   -During the past 4 weeks:   -how would you rate your health in general? Good   -how often have you been bothered by feeling dizzy when standing up? occasionally, has to get up slowly per wife   -how much have you been bothered by bodily pain? Moderately to severely   -Have you noticed any hearing difficulties? yes   -has your physical and emotional health limited your social activities with family or friends? yes    Emotional Health Questions   -Do you have a history of depression, anxiety, or emotional problems? no  3 most recent PHQ Screens 06/14/2017   Little interest or pleasure in doing things Not at all   Feeling down, depressed, irritable, or hopeless Not at all   Total Score PHQ 2 0      Health Habits   Please describe your diet habits: eats well, good variety  Do you get 5 servings of fruits or vegetables daily? Not sure  Do you exercise regularly? no    Activities of Daily Living and Functional Status   -Do you need help with eating, walking, dressing, bathing, toileting, the phone, transportation, shopping, preparing meals, housework, laundry, medications or managing money? Yes, needs help toileting, dressing, bathing, all of the above  -In the past four weeks, was someone available to help you if you needed and wanted help with anything? Yes, wife, they are looking for additional help  -Are you confident are you that you can control and manage most of your health problems? yes and n/a  -Have you been given information to help you keep track of your medications? yes and n/a  -How often do you have trouble taking your medications as prescribed? never    Fall Risk and Home Safety   Have you fallen 2 or more times in the past year? yes  Does your  home have rugs in the hallway, lack grab bars in the bathroom, lack handrails on the stairs or have poor lighting?  no  Do you have smoke detectors and check them regularly? yes  Do you have difficulties driving a car? Doesn't drive  Do you always fasten your seat belt when you are in a car? yes    Review of Systems (if indicated for problems addressed today)   Gen: denies fever   Card: denies chest pain  GU denies urinary hesitancy  GI denies bowel changes  Skin: denies rash  MSK: admits to severe joint pains as above  Neuro: admits to dizziness at times when sittin gup  ENT denies cold symptoms  All: denies allergies  Pulm: denies shortness of breath     Physical Examination     Vitals:    06/14/17 1457 06/14/17 1556   BP: 146/74 136/70   Pulse: 68    Resp: 16    Temp: 98.8 ??F (37.1 ??C)    TempSrc: Oral    SpO2: 94%    Height: 6\' 1"  (1.854 m)      Body mass index is 24.28 kg/m??.   No exam data present  Was the patient's timed Up & Go test unsteady or longer than 30 seconds? Yes, in wheelchair all the time    Evaluation of Cognitive Function   Mood/affect:  happy  Orientation: to situation at least  Appearance: age appropriate  Family member/caregiver input: wife and daughter agree his memory is slowly declining over more than 5 years    Additional exam if indicated for problems addressed today:  General: stated age, well developed, well nourished and in NAD, in wheelchair  Neck: supple, symmetrical, trachea midline, no adenopathy and thyroid: not enlarged, symmetric, no tenderness/mass/nodules  Lungs:  clear to auscultation w/o rales, rhonchi, wheezes w/normal effort and no use of accessory muscles of respiration   Heart: irregularly irregular rate and rhythm, S1, S2 normal, no murmur, click, rub or gallop  Abdomen: soft, nontender, no masses  Ext:  2+ BLE pitting edema noted.   Lymph: no cervical adenopathy appreciated  Skin:  Normal. and no rash or abnormalities   Psych: alert and oriented to person, place, time and situation and Speech: appropriate quality, quantity and organization of sentences      Advice/Referrals/Counseling (as indicated)   Education and counseling provided for any problems identified above: safety, fall prevention    Preventive Services     Health Maintenance   Topic Date Due   ??? DTaP/Tdap/Td series (1 - Tdap) 03/24/1941   ??? Shingrix Vaccine Age 77> (1 of 2) 03/23/1972   ??? GLAUCOMA SCREENING Q2Y  03/24/1987   ??? Pneumococcal 65+ years (1 of 2 - PCV13) 03/24/1987   ??? MEDICARE YEARLY EXAM  03/22/2016   ??? Influenza Age 499 to Adult  08/09/2017     (Preventive care checklist to be included in patient instructions)  Discussed today Done Previously Not Needed    ? records   Pneumococcal vaccines    x  Flu vaccine     x Hepatitis B vaccine (if at risk)   ? records   Shingles vaccine   ? records   TDAP vaccine     x Digital rectal exam     x PSA     x Colorectal cancer screening     x Low-dose CT for lung cancer screening   ? records   Bone density test    x  Glaucoma  screening    x  Cholesterol test   ? records X (labs ok 1 month ago)  Diabetes screening test      x Diabetes self-management class     x Nutritionist referral for diabetes or renal disease     Discussion of Advance Directive   Discussed with Hendricks Milo. Arpino his ability to prepare and advance directive in the case that an injury or illness causes him to be unable to make health care decisions.   On file already    Assessment/Plan   Z00.00    ICD-10-CM ICD-9-CM    1. Medicare annual wellness visit, subsequent Z00.00 V70.0    2. Cardiomyopathy, unspecified type (HCC) I42.9 425.4 REFERRAL TO HOSPICE   3. Chronic a-fib (HCC) I48.2 427.31 REFERRAL TO HOSPICE   4. Dislocation of right hip, initial encounter (HCC) S73.004A 835.00 REFERRAL TO HOSPICE   5. Coronary artery disease involving native heart without angina pectoris, unspecified vessel or lesion type I25.10 414.01 REFERRAL TO HOSPICE   6. Hearing loss, unspecified hearing loss type, unspecified laterality H91.90 389.9 REFERRAL TO HOSPICE   7. Chronic systolic heart failure  (HCC) W11.91 428.22 REFERRAL TO HOSPICE   8. Late onset Alzheimer's disease without behavioral disturbance G30.1 331.0 REFERRAL TO HOSPICE    F02.80 294.10      Getting to know him and need records  Not a lot of preventive measures needed at his age, but we should evaluate shots and dexa  That being said, he is very much declining with his dementia and with severe pain of inoperable hip dislocation  Has had side effects with narcotics, and I doubt they would help much. His pain is only when he moves around  However, this has really limited his activity in past 6 months, now wheelchair bound  Was not able to participate in PT due to dementia.  CHF seems stable, just saw cardiologist.     After discussion of their needs I recommended hospice referral  Wife doing all of his care, including bathing him. She is in her 90s and it is really too much.   However, not wanting much help  They are not wanting aggressive interventions, want him to be comfortable and happy at home.    Orders Placed This Encounter   ??? REFERRAL TO HOSPICE     F/u here 6-12 months depending on how he is doing       Lamar Laundry, MD

## 2017-06-14 NOTE — ACP (Advance Care Planning) (Signed)
Discussed with [name] [name] his ability to prepare and advance directive in the case that an injury or illness causes him to be unable to make health care decisions.   On file already

## 2017-06-15 NOTE — Telephone Encounter (Signed)
Requested [facility] on 06/15/17. Fax confirmation received back to office on 06/15/17.

## 2017-06-19 NOTE — Telephone Encounter (Signed)
Called Pam back and she said that the family wanted to see if Dr Clinton QuantSherrard would still see pt.  The hospice MD would be caring for the pt but they wanted to make sure that Dr Clinton QuantSherrard would still see him prn.  Advised yes DR Clinton QuantSherrard would.

## 2017-06-19 NOTE — Telephone Encounter (Signed)
Pam from [facility] is calling stating that the patients family has requested for the patient to be followed by hospice. Pam is needing to know from Dr. Clinton QuantSherrard, if its okay to go forward.         Best callback:  [phone]  Elita Quick(Pam)      LOV:  Thursday, June 14, 2017

## 2017-06-26 ENCOUNTER — Ambulatory Visit: Admit: 2017-06-26 | Payer: MEDICARE | Attending: Family | Primary: Family Medicine

## 2017-06-26 DIAGNOSIS — H6123 Impacted cerumen, bilateral: Secondary | ICD-10-CM

## 2017-06-26 NOTE — Progress Notes (Signed)
Chief Complaint   Patient presents with   ??? Wax in Ear     pts family wants ears checked to see if they need cleaning.     "REVIEWED RECORD IN PREPARATION FOR VISIT AND HAVE OBTAINED THE NECESSARY DOCUMENTATION"  1. Have you been to the ER, urgent care clinic since your last visit?  Hospitalized since your last visit?No    2. Have you seen or consulted any other health care providers outside of the [facility] since your last visit?  Include any pap smears or colon screening. No

## 2017-06-26 NOTE — Patient Instructions (Signed)
Earwax Blockage: Care Instructions  Your Care Instructions    Earwax is a natural substance that protects the ear canal. Normally, earwax drains from the ears and does not cause problems. Sometimes earwax builds up and hardens. Earwax blockage (also called cerumen impaction) can cause some loss of hearing and pain. When wax is tightly packed, you will need to have your doctor remove it.  Follow-up care is a key part of your treatment and safety. Be sure to make and go to all appointments, and call your doctor if you are having problems. It's also a good idea to know your test results and keep a list of the medicines you take.  How can you care for yourself at home?  ?? Do not try to remove earwax with cotton swabs, fingers, or other objects. This can make the blockage worse and damage the eardrum.  ?? If your doctor recommends that you try to remove earwax at home:  ? Soften and loosen the earwax with warm mineral oil. You also can try hydrogen peroxide mixed with an equal amount of room temperature water. Place 2 drops of the fluid, warmed to body temperature, in the ear two times a day for up to 5 days.  ? Once the wax is loose and soft, all that is usually needed to remove it from the ear canal is a gentle, warm shower. Direct the water into the ear, then tip your head to let the earwax drain out. Dry your ear thoroughly with a hair dryer set on low. Hold the dryer several inches from your ear.  ? If the warm mineral oil and shower do not work, use an over-the-counter wax softener. Read and follow all instructions on the label. After using the wax softener, use an ear syringe to gently flush the ear. Make sure the flushing solution is body temperature. Cool or hot fluids in the ear can cause dizziness.  When should you call for help?  Call your doctor now or seek immediate medical care if:  ?? ?? Pus or blood drains from your ear.   ?? ?? Your ears are ringing or feel full.   ?? ?? You have a loss of hearing.    ??Watch closely for changes in your health, and be sure to contact your doctor if:  ?? ?? You have pain or reduced hearing after 1 week of home treatment.   ?? ?? You have any new symptoms, such as nausea or balance problems.   Where can you learn more?  Go to http://www.healthwise.net/[employer]Help[employer].  Enter Q495 in the search box to learn more about "Earwax Blockage: Care Instructions."  Current as of: October 01, 2016  Content Version: 11.9  ?? 2006-2018 [employer]. Care instructions adapted under license by [employer] (which disclaims liability or warranty for this information). If you have questions about a medical condition or this instruction, always ask your healthcare professional. Healthwise, Inc[employer] disclaims any warranty or liability for your use of this information.

## 2017-06-26 NOTE — Progress Notes (Signed)
[facility]  Clinic Note  Subjective:      [name] [name] is a 84 y.o. male who presents for an acute visit with the following chief complaints.   Chief Complaint   Patient presents with   ??? Wax in Ear     pts family wants ears checked to see if they need cleaning.     Cerumen Impaction  Patient presents for evaluation of clogged ears. He has noticed decreased hearing and sensation of clogged ears. Gradual onset over the past several weeks. There is a prior history of cerumen impaction requiring regular cerumen removal. Patient denies ear pain. The patient was not using ear drops to loosen wax immediately prior to this visit. History of bilateral hearing aids, both have been removed prior to visit.    Recent transition to hospice care for late onset dementia, chronic systolic heart failure. Family is getting used to transition. He is not requiring any pain medication. Only has pain of the hip with transition from chair to chair. No pain at present.  Denies chest pain, SOB. Home weight is not monitored.     Current Outpatient Medications   Medication Sig Dispense Refill   ??? HYOSYNE 0.125 mg/mL solution   0   ??? acetaminophen (TYLENOL) 650 mg suppository   0   ??? BABY ASPIRIN PO Take 81 mg by mouth daily.     ??? calcium carbonate/vitamin D3 (CALCIUM 600 + D,3, PO) Take 3 Tabs by mouth daily.     ??? acetaminophen (TYLENOL) 500 mg tablet Take 650 mg by mouth every six (6) hours as needed for Pain.     ??? simvastatin (ZOCOR) 40 mg tablet Take 40 mg by mouth nightly.     ??? omeprazole (PRILOSEC) 20 mg capsule Take 20 mg by mouth daily.     ??? potassium chloride SR (KLOR-CON 10) 10 mEq tablet Take 10 mEq by mouth two (2) times a day.     ??? furosemide (LASIX) 20 mg tablet Take 20 mg by mouth daily. Indications: Only when Sweling     ??? haloperidol (HALDOL) 2 mg/mL oral concentrate   0   ??? LORAZEPAM INTENSOL 2 mg/mL concentrated solution   0    ??? morphine (ROXANOL) 100 mg/5 mL (20 mg/mL) concentrated solution   0     Allergies   Allergen Reactions   ??? Codeine Nausea Only   ??? Penicillins Shortness of Breath       ROS:   Complete review of systems was reviewed with pertinent information listed in HPI.  Review of Systems   Constitutional: Negative for chills, diaphoresis, fever, malaise/fatigue and weight loss.   HENT: Positive for hearing loss. Negative for congestion, ear pain, sinus pain, sore throat and tinnitus.         Worsening hearing loss, sensation of cerumen impaction   Eyes: Negative for blurred vision.   Respiratory: Negative for cough and shortness of breath.    Cardiovascular: Negative for chest pain, palpitations and leg swelling.   Gastrointestinal: Negative for abdominal pain, diarrhea, nausea and vomiting.   Genitourinary: Negative for dysuria.   Skin: Negative for rash.   Neurological: Negative for dizziness and headaches.   Psychiatric/Behavioral: Negative.        Objective:     Visit Vitals  BP 143/78 (BP 1 Location: Left arm, BP Patient Position: Sitting)   Pulse 65   Temp 98.4 ??F (36.9 ??C) (Oral)   Resp 18   SpO2 97%  Vitals and Nurse Documentation reviewed.     Physical Exam   Constitutional: He is oriented to person, place, and time and well-developed, well-nourished, and in no distress. He does not have a sickly appearance.   HENT:   Head: Normocephalic and atraumatic.   Right Ear: External ear and ear canal normal. Tympanic membrane is not erythematous and not bulging. No middle ear effusion. Decreased hearing is noted.   Left Ear: External ear and ear canal normal. Tympanic membrane is not erythematous and not bulging.  No middle ear effusion. Decreased hearing is noted.   Nose: No mucosal edema or rhinorrhea.   Mouth/Throat: Uvula is midline, oropharynx is clear and moist and mucous membranes are normal. Mucous membranes are not pale, not dry and not cyanotic. No oropharyngeal exudate, posterior oropharyngeal edema,  posterior oropharyngeal erythema or tonsillar abscesses.   Eyes: Conjunctivae are normal. Right conjunctiva is not injected. Left conjunctiva is not injected.   Cardiovascular: Normal rate.   Pulmonary/Chest: Effort normal and breath sounds normal. No respiratory distress.   Musculoskeletal: He exhibits no edema.   Lymphadenopathy:     He has no cervical adenopathy.   Neurological: He is alert and oriented to person, place, and time.   Gait not assess, in wheelchair    Skin: Skin is warm, dry and intact. No rash noted. No cyanosis. Nails show no clubbing.   Psychiatric: Mood and affect normal.   Nursing note and vitals reviewed.      Procedure Note for removal of cerumen impaction:  Large amounts of soft ear wax bilaterally.  Cerumen was removed by lavage bilaterally, he tolerated fairly well, no complications.       Assessment/Plan:     Diagnoses and all orders for this visit:    1. Bilateral impacted cerumen: Resolved with lavage bilaterally. Advised periodic OTC Debrox drops and warm water flushes as needed for prevention. Follow-up PRN.   -     REMOVAL IMPACTED CERUMEN IRRIGATION/LVG UNILAT    Follow-up and Dispositions    ?? Return if symptoms worsen or fail to improve.       Discussed expected course/resolution/complications of diagnosis in detail with patient. ??  Medication risks/benefits/costs/interactions/alternatives discussed with patient. ??  Pt was given an after visit summary which includes diagnoses, current medications & vitals. ??Pt expressed understanding with the diagnosis and plan

## 2017-06-26 NOTE — Progress Notes (Signed)
 Chief Complaint   Patient presents with   . Wax in Ear     pts family wants ears checked to see if they need cleaning.     REVIEWED RECORD IN PREPARATION FOR VISIT AND HAVE OBTAINED THE NECESSARY DOCUMENTATION  1. Have you been to the ER, urgent care clinic since your last visit?  Hospitalized since your last visit?No    2. Have you seen or consulted any other health care providers outside of the [facility] since your last visit?  Include any pap smears or colon screening. No

## 2017-06-26 NOTE — Progress Notes (Signed)
Progress Notes by Dala DockWichman, Deette Revak, NP at 06/26/17 1530                Author: Dala DockWichman, Lavine Hargrove, NP  Service: --  Author Type: Nurse Practitioner       Filed: 06/26/17 1613  Encounter Date: 06/26/2017  Status: Signed          Editor: Dala DockWichman, [name], NP (Nurse Practitioner)                        [name]   Cl[facility] Note     Subjective:         [name] [name] is a 84 y.o.  male who presents for an acute visit with the following chief complaints.      Chief Complaint       Patient presents with        ?  [name] in Ear             pts family wants ears checked to see if they need cleaning.        Cerumen Impaction   Patient presents for evaluation of clogged ears. He  has noticed decreased hearing and sensation of clogged ears. Gradual onset over the past several weeks. There is a prior history of cerumen impaction requiring regular cerumen removal. Patient denies ear pain. The patient was not using ear drops to loosen  [name] immediately prior to this visit. History of bilateral hearing aids, both have been removed prior to visit.      Recent transition to hospice care for late onset dementia, chronic systolic heart failure. Family is getting used to transition. He is not requiring any pain medication. Only has pain of the hip with transition from chair to chair. No pain at present.   Denies chest pain, SOB. Home weight is not monitored.         Current Outpatient Medications          Medication  Sig  Dispense  Refill           ?  HYOSYNE 0.125 mg/mL solution      0     ?  acetaminophen (TYLENOL) 650 mg suppository      0     ?  BABY ASPIRIN PO  Take 81 mg by mouth daily.         ?  calcium carbonate/vitamin D3 (CALCIUM 600 + D,3, PO)  Take 3 Tabs by mouth daily.         ?  acetaminophen (TYLENOL) 500 mg tablet  Take 650 mg by mouth every six (6) hours as needed for Pain.         ?  simvastatin (ZOCOR) 40 mg tablet  Take 40 mg by mouth nightly.         ?  omeprazole (PRILOSEC) 20 mg  capsule  Take 20 mg by mouth daily.         ?  potassium chloride SR (KLOR-CON 10) 10 mEq tablet  Take 10 mEq by mouth two (2) times a day.         ?  furosemide (LASIX) 20 mg tablet  Take 20 mg by mouth daily. Indications: Only when Sweling         ?  haloperidol (HALDOL) 2 mg/mL oral concentrate      0     ?  LORAZEPAM INTENSOL 2 mg/mL concentrated solution      0           ?  morphine (ROXANOL) 100 mg/5 mL (20 mg/mL) concentrated solution      0          Allergies        Allergen  Reactions         ?  Codeine  Nausea Only         ?  Penicillins  Shortness of Breath             ROS:     Complete review of systems was reviewed with pertinent information listed in HPI.   Review of Systems    Constitutional: Negative for chills, diaphoresis, fever, malaise/fatigue and weight loss.    HENT: Positive for hearing loss. Negative for congestion, ear pain, sinus pain, sore throat and tinnitus.          Worsening hearing loss, sensation of cerumen impaction    Eyes: Negative for blurred vision.    Respiratory: Negative for cough and shortness of breath.     Cardiovascular: Negative for chest pain, palpitations and leg swelling.    Gastrointestinal: Negative for abdominal pain, diarrhea, nausea and vomiting.    Genitourinary: Negative for dysuria.    Skin: Negative for rash.    Neurological: Negative for dizziness and headaches.    Psychiatric/Behavioral: Negative.             Objective:        Visit Vitals      BP  143/78 (BP 1 Location: Left arm, BP Patient Position: Sitting)     Pulse  65     Temp  98.4 ??F (36.9 ??C) (Oral)     Resp  18        SpO2  97%           Vitals and Nurse Documentation reviewed.       Physical Exam    Constitutional: He is oriented to person, place, and time and well-developed, well-nourished, and in no distress. He does not have a sickly appearance.    HENT:    Head: Normocephalic and atraumatic.   Right Ear: External ear and ear canal normal. Tympanic membrane is not erythematous and not bulging.  No middle ear effusion. Decreased hearing  is noted.   Left Ear: External ear and ear canal normal. Tympanic membrane is not erythematous and not bulging.  No middle ear effusion. Decreased hearing  is noted.    Nose: No mucosal edema or rhinorrhea.    Mouth/Throat: Uvula is midline, oropharynx is clear and moist and mucous membranes are normal. Mucous membranes are not pale, not dry and not cyanotic. No oropharyngeal exudate, posterior oropharyngeal edema, posterior oropharyngeal erythema or tonsillar  abscesses.    Eyes: Conjunctivae are normal. Right conjunctiva is not injected. Left conjunctiva is not injected.    Cardiovascular: Normal rate.    Pulmonary/Chest: Effort normal and breath sounds normal. No respiratory distress.   Musculoskeletal: He exhibits no edema.   Lymphadenopathy:     He has no cervical  adenopathy.   Neurological: He is alert and oriented to person, place, and time.    Gait not assess, in wheelchair     Skin: Skin is warm, dry and intact. No rash noted. No cyanosis. Nails show no clubbing.   Psychiatric: Mood and affect normal.     Nursing note and vitals reviewed.         Procedure Note for removal of cerumen impaction:   Large amounts of soft ear  [name] bilaterally.  Cerumen was removed by  lavage  bilaterally, he tolerated fairly well, no complications.            Assessment/Plan:        Diagnoses and all orders for this visit:      1. Bilateral impacted cerumen: Resolved with lavage bilaterally. Advised periodic OTC Debrox drops and warm water flushes as needed for prevention.  Follow-up PRN.    -     REMOVAL IMPACTED CERUMEN IRRIGATION/LVG UNILAT        Follow-up and Dispositions      ??  Return if symptoms worsen or fail to improve.             Discussed expected course/resolution/complications of diagnosis in detail with patient. ??   Medication risks/benefits/costs/interactions/alternatives discussed with patient. ??   Pt was given an after visit summary which includes diagnoses, current  medications & vitals. ??Pt expressed understanding with the diagnosis and plan

## 2017-08-09 DEATH — deceased

## 2018-07-29 IMAGING — CR DG CHEST 2V
2 series · 2 of 2 positions shown · non-contrast
Comparison: 02/08/2013

CLINICAL DATA: Wheezing for 2 weeks, cough.

EXAM:
CHEST  2 VIEW

[w chest pa]
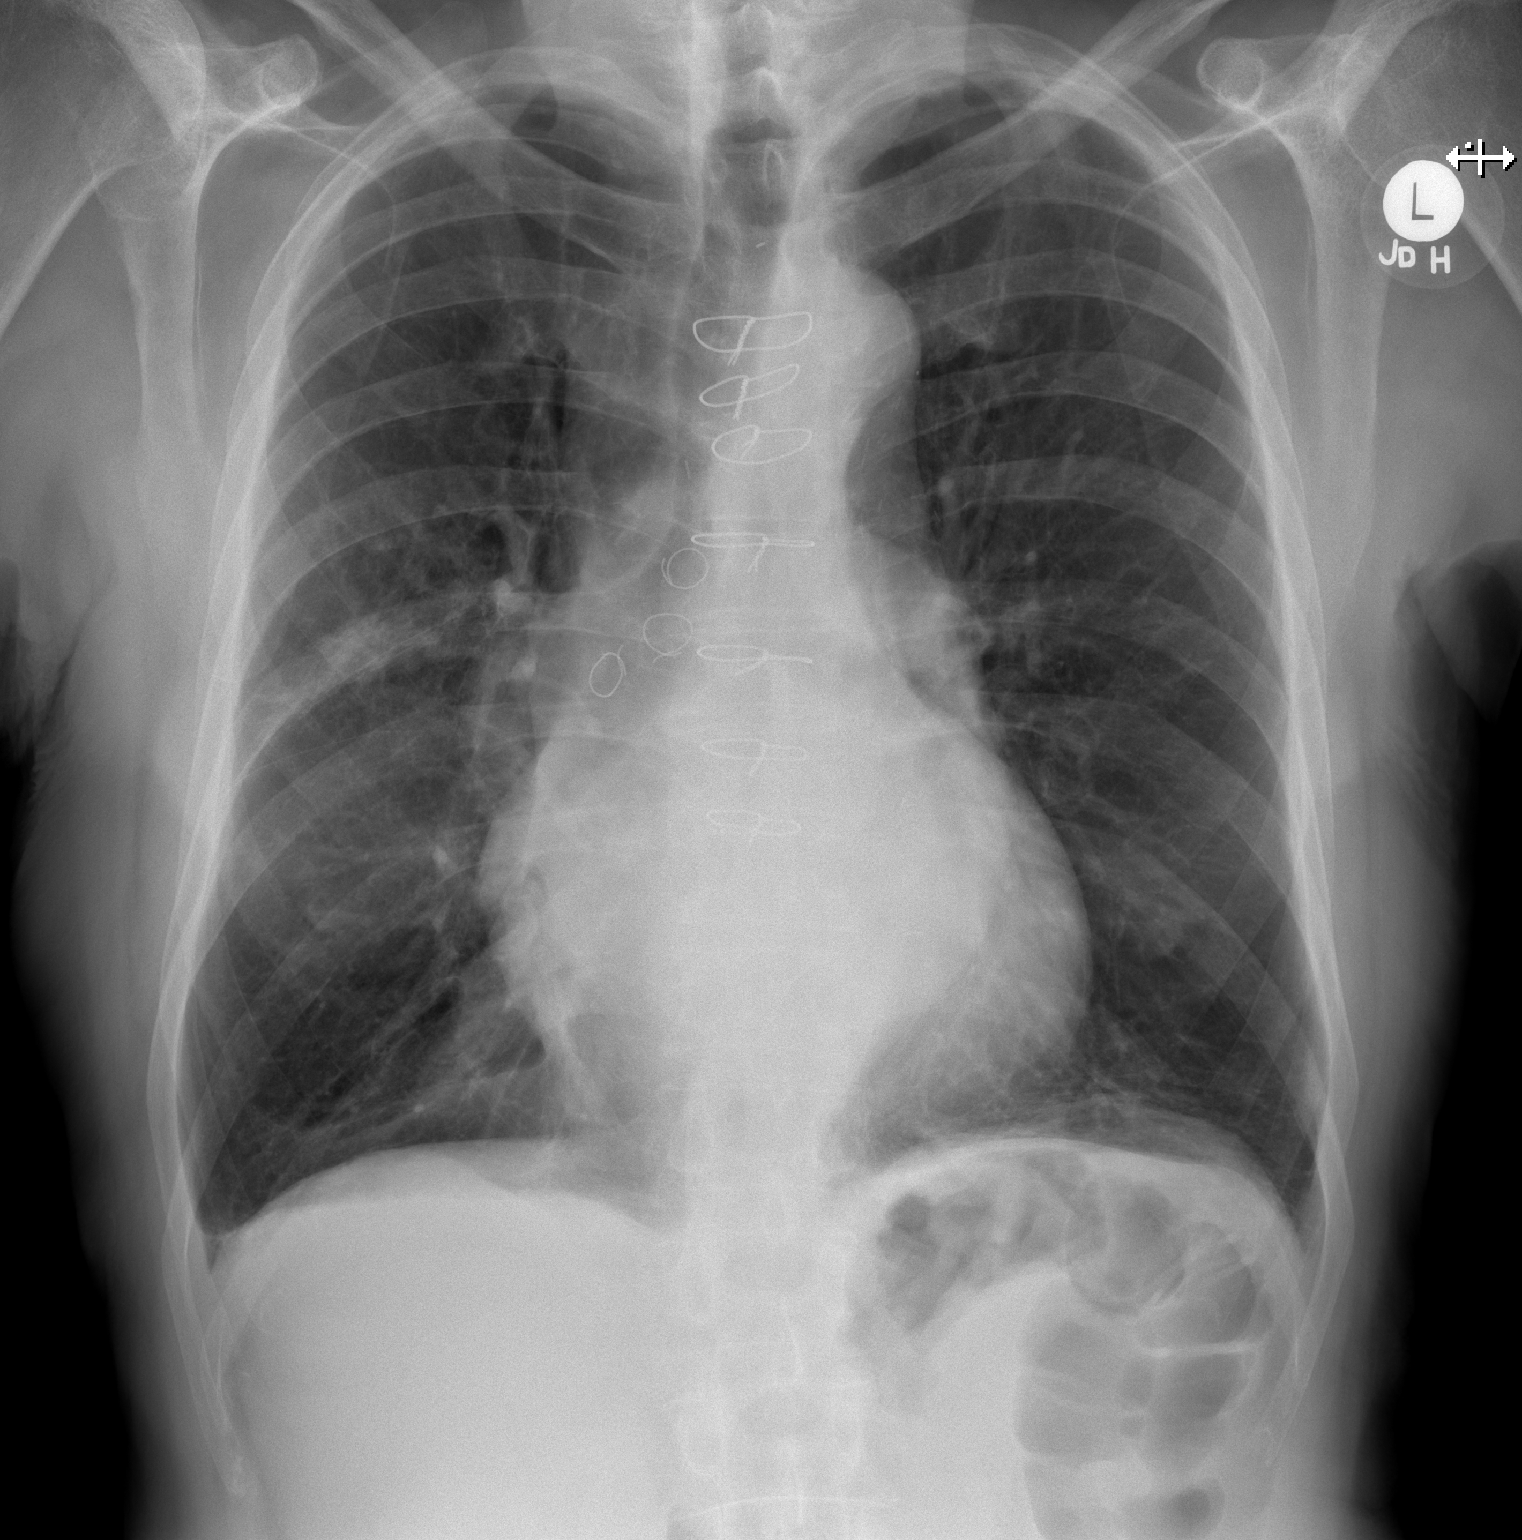

[w chest lat]
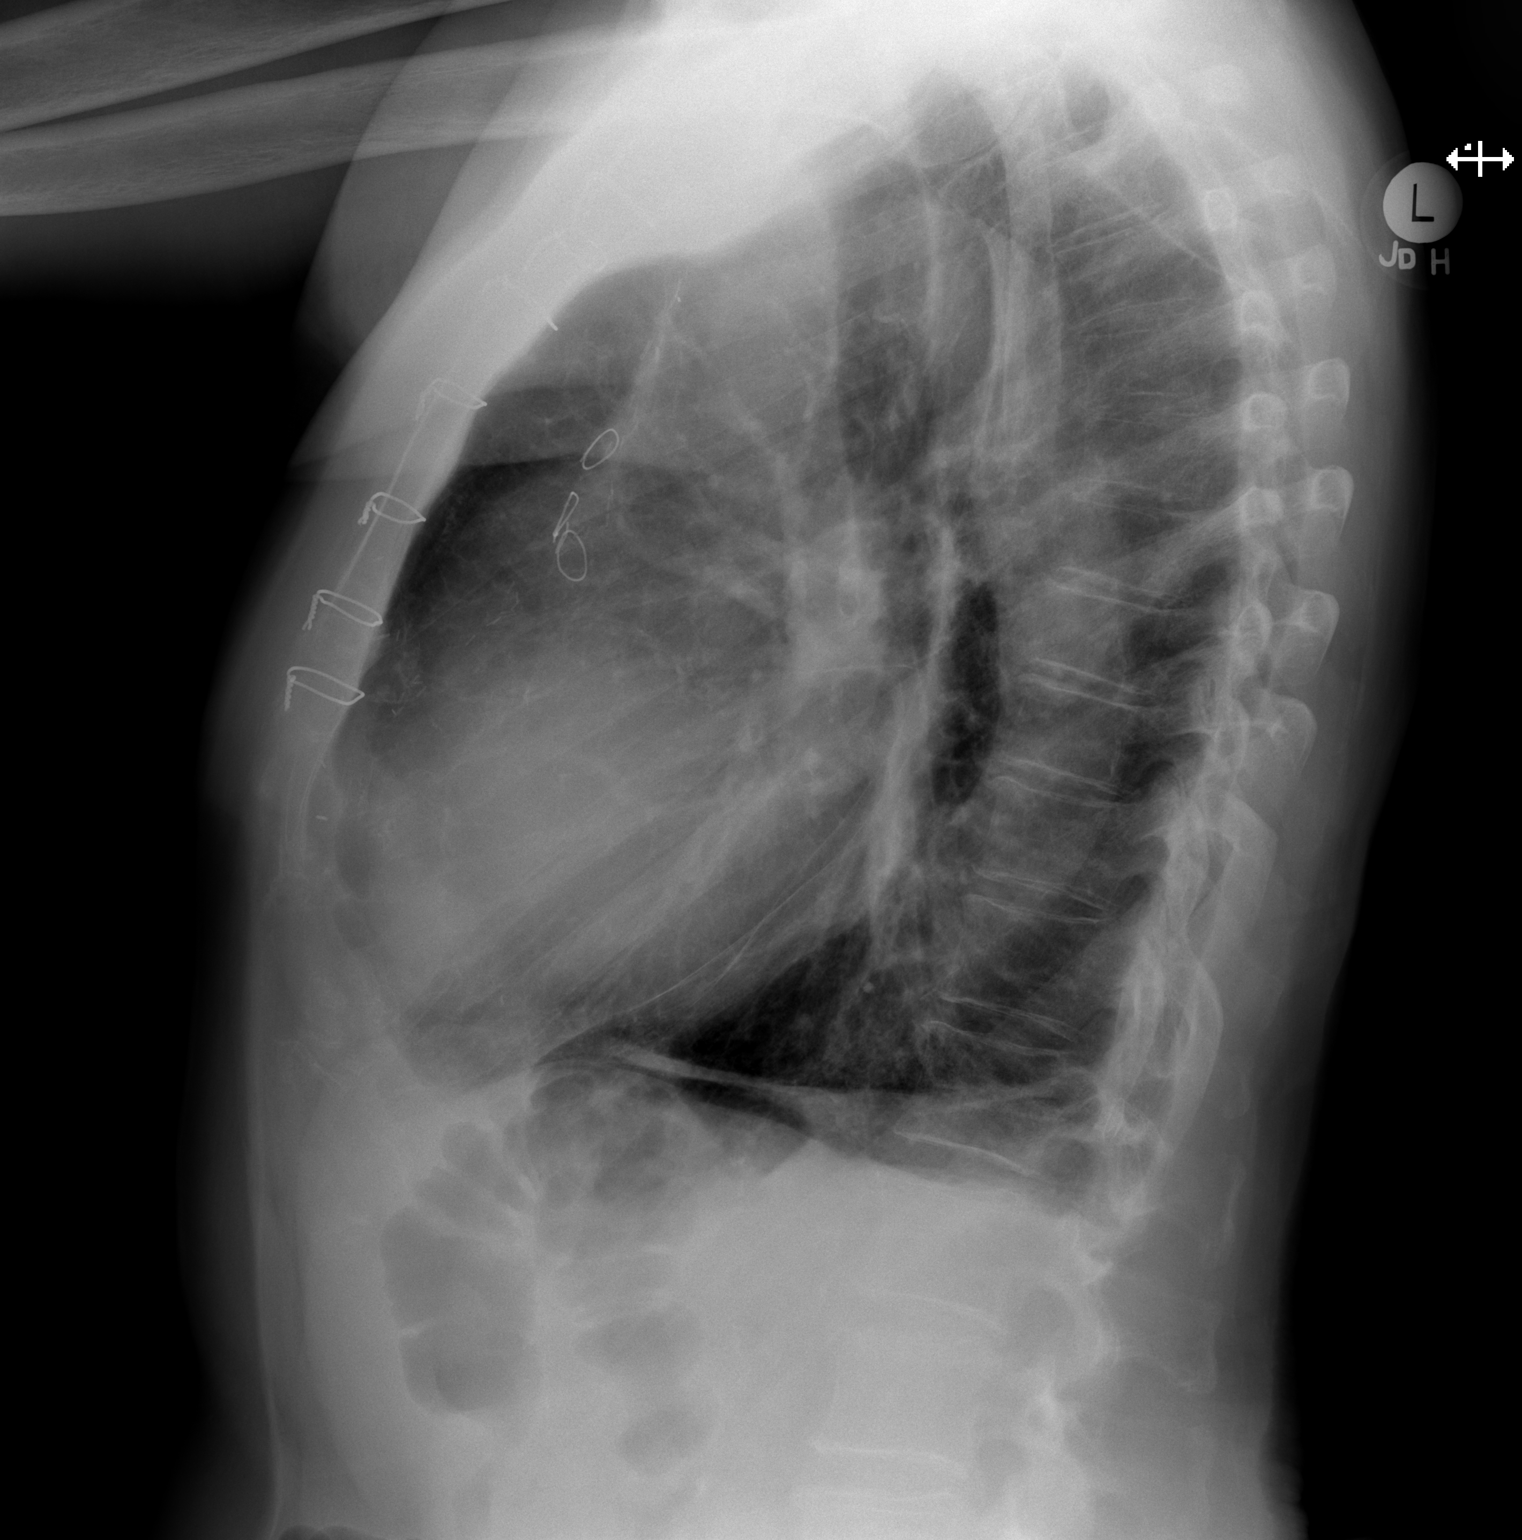

[2 of 2 positions shown; findings below may reference images not displayed]

FINDINGS: There is hyperinflation of the lungs compatible with COPD. Heart is
mildly enlarged. Prior CABG. No effusions.

Opacity noted in the right mid lung which could reflect early
infiltrate/ pneumonia. Recommend follow-up. Left lung is clear.
IMPRESSION: COPD.

Patchy opacity in the right mid lung. Cannot exclude pneumonia.
Followup PA and lateral chest X-ray is recommended in 3-4 weeks
following trial of antibiotic therapy to ensure resolution and
exclude underlying malignancy.
